# Patient Record
Sex: Female | Born: 1985 | Race: White
Health system: Northeastern US, Academic
[De-identification: ages and names within clinical notes are randomized; demographics above are authoritative.]

## PROBLEM LIST (undated history)

## (undated) DIAGNOSIS — D649 Anemia, unspecified: Secondary | ICD-10-CM

## (undated) DIAGNOSIS — F32A Depression, unspecified: Secondary | ICD-10-CM

## (undated) DIAGNOSIS — K922 Gastrointestinal hemorrhage, unspecified: Secondary | ICD-10-CM

## (undated) HISTORY — PX: CHOLECYSTECTOMY: SHX55

## (undated) HISTORY — PX: ABDOMINAL HYSTERECTOMY: SHX81

## (undated) HISTORY — PX: GALLBLADDER SURGERY: SHX652

---

## 2018-11-04 ENCOUNTER — Encounter: Payer: Self-pay | Admitting: Emergency Medicine

## 2018-11-04 ENCOUNTER — Emergency Department: Payer: MEDICAID | Admitting: Radiology

## 2018-11-04 ENCOUNTER — Observation Stay
Admission: EM | Admit: 2018-11-04 | Discharge: 2018-11-05 | Disposition: A | Discharge status: Home / Self Care | Payer: MEDICAID | Source: Ambulatory Visit | Attending: Emergency Medicine | Admitting: Emergency Medicine

## 2018-11-04 DIAGNOSIS — R000 Tachycardia, unspecified: Secondary | ICD-10-CM | POA: Insufficient documentation

## 2018-11-04 DIAGNOSIS — D649 Anemia, unspecified: Secondary | ICD-10-CM | POA: Insufficient documentation

## 2018-11-04 DIAGNOSIS — K920 Hematemesis: Principal | ICD-10-CM | POA: Insufficient documentation

## 2018-11-04 DIAGNOSIS — R1011 Right upper quadrant pain: Secondary | ICD-10-CM

## 2018-11-04 DIAGNOSIS — R1013 Epigastric pain: Secondary | ICD-10-CM | POA: Insufficient documentation

## 2018-11-04 DIAGNOSIS — R42 Dizziness and giddiness: Secondary | ICD-10-CM

## 2018-11-04 DIAGNOSIS — R109 Unspecified abdominal pain: Secondary | ICD-10-CM

## 2018-11-04 HISTORY — DX: Depression, unspecified: F32.A

## 2018-11-04 HISTORY — DX: Gastrointestinal hemorrhage, unspecified: K92.2

## 2018-11-04 HISTORY — DX: Anemia, unspecified: D64.9

## 2018-11-04 LAB — HM HIV SCREENING OFFERED

## 2018-11-04 LAB — PLASMA PROF 7 (ED ONLY)
Anion Gap,PL: 15 (ref 7–16)
CO2,Plasma: 25 mmol/L (ref 20–28)
Chloride,Plasma: 100 mmol/L (ref 96–108)
Creatinine: 0.75 mg/dL (ref 0.51–0.95)
GFR,Black: 121 *
GFR,Caucasian: 105 *
Glucose,Plasma: 89 mg/dL (ref 60–99)
Potassium,Plasma: 3.5 mmol/L (ref 3.4–4.7)
Sodium,Plasma: 140 mmol/L (ref 133–145)
UN,Plasma: 7 mg/dL (ref 6–20)

## 2018-11-04 LAB — RUQ PANEL (ED ONLY)
ALT: 15 U/L (ref 0–35)
AST: 15 U/L (ref 0–35)
Albumin: 4.8 g/dL (ref 3.5–5.2)
Alk Phos: 58 U/L (ref 35–105)
Amylase: 38 U/L (ref 28–100)
Bilirubin,Direct: <0.2 mg/dL (ref 0.0–0.3)
Bilirubin,Total: 0.3 mg/dL (ref 0.0–1.2)
Lipase: 16 U/L (ref 13–60)
Total Protein: 7.7 g/dL (ref 6.3–7.7)

## 2018-11-04 LAB — CBC AND DIFFERENTIAL
Baso # K/uL: 0 10*3/uL (ref 0.0–0.1)
Basophil %: 0.5 %
Eos # K/uL: 0.1 10*3/uL (ref 0.0–0.4)
Eosinophil %: 0.6 %
Hematocrit: 35 % (ref 34–45)
Hemoglobin: 10.3 g/dL — ABNORMAL LOW (ref 11.2–15.7)
IMM Granulocytes #: 0 10*3/uL
IMM Granulocytes: 0.3 %
Lymph # K/uL: 2.1 10*3/uL (ref 1.2–3.7)
Lymphocyte %: 27.2 %
MCH: 26 pg/cell (ref 26–32)
MCHC: 30 g/dL — ABNORMAL LOW (ref 32–36)
MCV: 87 fL (ref 79–95)
Mono # K/uL: 0.4 10*3/uL (ref 0.2–0.9)
Monocyte %: 4.9 %
Neut # K/uL: 5.2 10*3/uL (ref 1.6–6.1)
Nucl RBC # K/uL: 0 10*3/uL (ref 0.0–0.0)
Nucl RBC %: 0 /100 WBC (ref 0.0–0.2)
Platelets: 465 10*3/uL — ABNORMAL HIGH (ref 160–370)
RBC: 4 MIL/uL (ref 3.9–5.2)
RDW: 16.1 % — ABNORMAL HIGH (ref 11.7–14.4)
Seg Neut %: 66.5 %
WBC: 7.8 10*3/uL (ref 4.0–10.0)

## 2018-11-04 LAB — TYPE AND SCREEN
ABO RH Blood Type: A POS
Antibody Screen: NEGATIVE

## 2018-11-04 LAB — PROTIME-INR
INR: 1 (ref 0.9–1.1)
Protime: 11.9 s (ref 10.0–12.9)

## 2018-11-04 LAB — APTT: aPTT: 39 s — ABNORMAL HIGH (ref 25.8–37.9)

## 2018-11-04 LAB — BLOOD BANK HOLD LAVENDER

## 2018-11-04 LAB — BLOOD BANK HOLD RED

## 2018-11-04 LAB — PREGNANCY TEST, SERUM: Preg,Serum: NEGATIVE

## 2018-11-04 MED ORDER — DEXTROSE 5 % FLUSH FOR PUMPS *I*
0.0000 mL/h | INTRAVENOUS | Status: DC | PRN
Start: 2018-11-04 — End: 2018-11-05

## 2018-11-04 MED ORDER — SODIUM CHLORIDE 0.9 % FLUSH FOR PUMPS *I*
0.0000 mL/h | INTRAVENOUS | Status: DC | PRN
Start: 2018-11-04 — End: 2018-11-05

## 2018-11-04 MED ORDER — PANTOPRAZOLE SODIUM 40 MG IV SOLR *I*
40.0000 mg | Freq: Once | INTRAVENOUS | Status: AC
Start: 2018-11-04 — End: 2018-11-04
  Administered 2018-11-04: 40 mg via INTRAVENOUS
  Filled 2018-11-04: qty 10

## 2018-11-04 MED ORDER — MORPHINE SULFATE 4 MG/ML IV SOLN *WRAPPED*
4.0000 mg | Freq: Once | INTRAVENOUS | Status: AC
Start: 2018-11-04 — End: 2018-11-04
  Administered 2018-11-04: 4 mg via INTRAVENOUS
  Filled 2018-11-04: qty 1

## 2018-11-04 MED ORDER — PANTOPRAZOLE SODIUM 40 MG IV SOLR *I*
40.0000 mg | Freq: Two times a day (BID) | INTRAVENOUS | Status: DC
Start: 2018-11-05 — End: 2018-11-05
  Administered 2018-11-05: 40 mg via INTRAVENOUS
  Filled 2018-11-04: qty 10

## 2018-11-04 MED ORDER — FAMOTIDINE (PF) 20 MG/2ML IV SOLN *I*
20.0000 mg | Freq: Two times a day (BID) | INTRAVENOUS | Status: DC
Start: 2018-11-04 — End: 2018-11-05
  Administered 2018-11-04 – 2018-11-05 (×2): 20 mg via INTRAVENOUS
  Filled 2018-11-04 (×4): qty 2

## 2018-11-04 MED ORDER — SODIUM CHLORIDE 0.9 % IV SOLN WRAPPED *I*
100.0000 mL/h | Status: DC
Start: 2018-11-04 — End: 2018-11-05
  Administered 2018-11-04: 100 mL/h via INTRAVENOUS
  Administered 2018-11-04 – 2018-11-05 (×10): 100 mL/h

## 2018-11-04 MED ORDER — ACETAMINOPHEN 325 MG PO TABS *I*
650.0000 mg | ORAL_TABLET | Freq: Four times a day (QID) | ORAL | Status: DC | PRN
Start: 2018-11-04 — End: 2018-11-05
  Administered 2018-11-05: 650 mg via ORAL
  Filled 2018-11-04: qty 2

## 2018-11-04 MED ORDER — MORPHINE SULFATE 4 MG/ML IV SOLN *WRAPPED*
4.0000 mg | INTRAVENOUS | Status: AC | PRN
Start: 2018-11-04 — End: 2018-11-05
  Administered 2018-11-05 (×4): 4 mg via INTRAVENOUS
  Filled 2018-11-04 (×4): qty 1

## 2018-11-04 MED ORDER — PANTOPRAZOLE SODIUM 40 MG IV SOLR *I*
40.0000 mg | Freq: Two times a day (BID) | INTRAVENOUS | Status: DC
Start: 2018-11-04 — End: 2018-11-04

## 2018-11-04 MED ORDER — SODIUM CHLORIDE 0.9 % IV BOLUS *I*
1000.0000 mL | Freq: Once | Status: AC
Start: 2018-11-04 — End: 2018-11-04
  Administered 2018-11-04: 1000 mL via INTRAVENOUS

## 2018-11-04 MED ORDER — ONDANSETRON HCL 2 MG/ML IV SOLN *I*
4.0000 mg | Freq: Four times a day (QID) | INTRAMUSCULAR | Status: DC | PRN
Start: 2018-11-04 — End: 2018-11-05
  Administered 2018-11-04 – 2018-11-05 (×2): 4 mg via INTRAVENOUS
  Filled 2018-11-04 (×2): qty 2

## 2018-11-04 NOTE — ED Obs Notes (Signed)
ED OBSERVATION ADMISSION NOTE    Patient seen by me 11/04/2018 at 10:40 PM     Current patient status: Observation    History     Chief Complaint   Patient presents with    Hematemesis     32 year old female with a history of anemia who presented to the ED with hematemesis that started yesterday. She reports vomiting bright red blood, unable to quantify the amount. She also admits to diffuse stabbing abdominal pain and chills. She noticed some bruising on her arm today, no known trauma and also reports dizziness and myalgias. She denies bloody or black/tarry stools. She also denies fevers, cough/URI symptoms, urinary symptoms, diarrhea. She took tramadol without improvement in her pain. She denies similar symptoms in the past. She denies taking NSAIDs or aspirin and denies etoh intake.      History provided by:  Patient  Language interpreter used: No      Past Medical History:   Diagnosis Date    Anemia      Past Surgical History:   Procedure Laterality Date    CESAREAN SECTION, UNSPECIFIED      GALLBLADDER SURGERY       History reviewed. No pertinent family history.    Social History      reports that she has never smoked. She has never used smokeless tobacco. She reports previous alcohol use. She reports that she does not use drugs. No history on file for sexual activity.    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 Negative for fever.   HENT: Negative for congestion, rhinorrhea and sore throat.    Eyes: Positive for visual disturbance.   Respiratory: Negative for cough and shortness of breath.    Cardiovascular: Negative for chest pain.   Gastrointestinal: Positive for abdominal pain and vomiting. Negative for blood in stool, constipation and diarrhea.   Genitourinary:  Negative for difficulty urinating and dysuria.   Musculoskeletal: Positive for myalgias.   Skin: Positive for color change (bruises on upper arms).   Allergic/Immunologic: Negative for immunocompromised state.   Neurological: Positive for light-headedness and headaches.     Physical Exam   BP 131/76 (BP Location: Right arm)    Pulse 80    Temp 36.6 C (97.9 F) (Temporal)    Resp 16    Ht 1.524 m (5')    Wt 95.3 kg (210 lb)    SpO2 99%    BMI 41.01 kg/m     Physical Exam  Vitals signs and nursing note reviewed.   Constitutional:       Appearance: Normal appearance.   HENT:      Head: Normocephalic and atraumatic.      Nose: Nose normal.   Eyes:      Conjunctiva/sclera: Conjunctivae normal.   Neck:      Musculoskeletal: Normal range of motion.   Cardiovascular:      Rate and Rhythm: Normal rate.   Pulmonary:      Effort: Pulmonary effort is normal.      Breath sounds: Normal breath sounds.   Abdominal:      General: Bowel sounds are normal.  There is no distension.      Palpations: Abdomen is soft.      Tenderness: There is tenderness in the epigastric area and periumbilical area. There is no guarding.   Musculoskeletal:      Right lower leg: No edema.      Left lower leg: No edema.   Skin:     General: Skin is warm and dry.   Neurological:      General: No focal deficit present.      Mental Status: She is alert.   Psychiatric:         Mood and Affect: Mood normal.         Behavior: Behavior normal.       Tests   Labs:   WBC 7.8   Hemoglobin 10.3*   Hematocrit 35   Platelets 465*     Sodium,Plasma 140   Potassium,Plasma 3.5   Chloride,Plasma 100   CO2,Plasma 25   Anion Gap,PL 15   UN,Plasma 7   Creatinine 0.75   GFR,Black 121   GFR,Caucasian 105   Glucose,Plasma 89   Total Protein 7.7   Albumin 4.8   ALT 15   AST 15   Alk Phos 58   Amylase 38   Bilirubin,Direct <0.2   Bilirubin,Total 0.3   Lipase 16   Protime 11.9   INR 1.0   aPTT 39.0 (H)     Serum pregnancy negative     Imaging: none for this visit     Medical  Decision Making      Amount and/or Complexity of Data Reviewed  Clinical lab tests: ordered and reviewed    Assessment:    32 y.o. female with a history of anemia placed in OBS after evaluation in the ED for hematemesis and diffuse abdominal pain that started yesterday. Labs notable for HCT 35. Serum pregnancy negative, RUQ panel and BMP are unremarkable. She is hemodynamically stable, placed in OBS for ongoing monitoring and evaluation.     Differential Diagnosis includes PUD, esophagitis, gastritis, duodenitis                   Plan:   1. Hematemesis   -IV Protonix and IV pepcid BID    -IV Zofran as needed for nausea   -PO Tylenol/IV Morphine as needed for pain   -Monitor HCT q8h   -NPO, maintenance IVF at 100 cc/hour  -GI consult in the morning     Medically preferred DVT prophylaxis: None  Smoking Cessation: [address], [place]     [place], [place], [place]  11/04/18 2349

## 2018-11-04 NOTE — ED Notes (Signed)
Pt arrived on EOU from ed via stretcher, transferred independently, husband at bedside. Pt currently rating abdominal pain as a 6 out of 10. Had emesis basin at bedside, with dried bright red blood from her her last emesis in ED approximately 30 min ago per pt. Pt also reports body aches that started yesterday with vomiting. Per pt, had not been having emesis until yesterday and that the bright red blood was present in her first episode. Reviewed cal light system, location of BR, voiced understanding, call light left within reach

## 2018-11-04 NOTE — ED Notes (Signed)
11/04/18 2238   Observation Care   Observation care initiated  Yes   Patient has been verbally notified of their observation status Yes

## 2018-11-04 NOTE — ED Triage Notes (Addendum)
Denies pregnancy / Began vomiting blood this am / " stabbing abdominal pain" Has picture of BRB on phone       Triage Note   Danielle Rankin, RN

## 2018-11-04 NOTE — ED Notes (Signed)
Pt arrived to ED with family member. Pt c/o of abdominal pain since yesterday. Pt has been having numerous episodes of vomiting, pt experiencing bright red blood when she vomits. Pt states she has never had this happen before. Pt rating pain at 10 that feels like stabbing, pain in non radiating. Pt is not pregnant, confirmed by blood test.

## 2018-11-04 NOTE — Progress Notes (Signed)
ED RN INTERN ATTESTATION       I Marcy Siren, RN (RN) reviewed the following charting information by the RN intern:   Annette Preston    Nursing Assessments  Medications  Plan of Care  Teaching   Notes    In the chart of Annette Preston (32 y.o. female) and attest to the charting being accurate.

## 2018-11-04 NOTE — ED Provider Notes (Addendum)
History     Chief Complaint   Patient presents with    Hematemesis     32 year old female with history of anemia presents to the ED for evaluation of hematemesis and upper abdominal pain. Patient states that symptoms began suddenly yesterday with onset epigastric and right upper quadrant abdominal pain. This has been followed by nausea and vomiting of bright red blood. Patient describes abdominal pain as moderate in severity, and stabbing in quality. Denies radiation up into the chest or back as well as shortness of breath however reports generalized myalgias. Notes dizziness and recent easy bruising but denies headaches, palpitations, cough, rhinorrhea, recent illness, fevers, chills, rash, urinary symptoms, diarrhea, or any other complaints at this time. Notes history of anemia requiring transfusions in the past believed to be due to her heavy periods. Denies ETOH use or OCP use at this time.       History provided by:  Patient      Medical/Surgical/Family History     No past medical history on file.     There is no problem list on file for this patient.           No past surgical history on file.  No family history on file.       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   HENT: Negative for congestion and rhinorrhea.    Respiratory: Negative for cough and shortness of breath.    Cardiovascular: Negative for chest pain and palpitations.   Gastrointestinal: Positive for abdominal pain, nausea and vomiting. Negative for diarrhea.        Positive for Hematemesis   Endocrine: Negative for polyuria.   Genitourinary: Negative  for dysuria, hematuria and pelvic pain.   Musculoskeletal: Positive for myalgias. Negative for arthralgias, back pain and neck pain.   Neurological: Positive for dizziness. Negative for syncope, light-headedness and headaches.   Hematological: Bruises/bleeds easily.       Physical Exam     Triage Vitals      First Recorded BP: (!) 150/100, Resp: 18, Temp: 36.2 C (97.2 F), Temp src: TEMPORAL Oxygen Therapy SpO2: 100 %, Oximetry Source: Lt Hand, O2 Device: None (Room air), Heart Rate: 101, (11/04/18 1501)  .  First Pain Reported  0-10 Scale: 10, (11/04/18 1501)       Physical Exam  Vitals signs and nursing note reviewed.   Constitutional:       General: She is not in acute distress.     Appearance: She is well-developed and normal weight. She is not ill-appearing, toxic-appearing or diaphoretic.      Comments: Comfortable appearing young female.  BRB in vomit bucket   HENT:      Head: Normocephalic and atraumatic.      Nose: Nose normal. No congestion or rhinorrhea.      Mouth/Throat:  Mouth: Mucous membranes are dry.   Eyes:      General: No scleral icterus.     Extraocular Movements: Extraocular movements intact.      Conjunctiva/sclera: Conjunctivae normal.      Pupils: Pupils are equal, round, and reactive to light.   Neck:      Musculoskeletal: Normal range of motion and neck supple.   Cardiovascular:      Rate and Rhythm: Regular rhythm. Tachycardia present.      Heart sounds: Normal heart sounds. No murmur. No friction rub. No gallop.    Pulmonary:      Effort: Pulmonary effort is normal. No respiratory distress.      Breath sounds: Normal breath sounds. No wheezing or rales.   Chest:      Chest wall: No tenderness.   Abdominal:      General: Bowel sounds are normal. There is no distension.      Palpations: Abdomen is soft. There is no mass.      Tenderness: There is tenderness (RUQ Tenderness) in the right upper quadrant and epigastric area. There is no guarding or rebound.   Musculoskeletal: Normal  range of motion.         General: No tenderness or deformity.   Skin:     General: Skin is warm and dry.      Coloration: Skin is not jaundiced.      Findings: No erythema or rash.   Neurological:      General: No focal deficit present.      Mental Status: She is alert and oriented to person, place, and time. Mental status is at baseline.      Cranial Nerves: No cranial nerve deficit.      Motor: No weakness.      Gait: Gait normal.   Psychiatric:         Mood and Affect: Mood normal.         Behavior: Behavior normal.         Thought Content: Thought content normal.         Medical Decision Making   Patient seen by me on:  11/04/2018    Assessment:  32 year old female presents to the ED for evaluation of hematemesis and epigastric/RUQ abdominal pain. Patient is hemodynamically stable at this time with examination notable for mild tachycardia as well as epigastric and RUQ tenderness to palpation. No other acute findings noted. Given patient history and reports of easy bruising, concern for platelet dysfunction or liver dysfunction. Abdominal ulcer, gastritis, mallory weiss tear also considered as well as RUQ pathology including cholecystitis, cholelithiasis or pancreatitis. Will treat with Protonix initially, Obtain IV access, and give fluid bolus. Will also get CBC, Electrolytes, RUQ Panel, PT/INR for assessment.      Differential diagnosis:  Gastritis, Abdominal Ulcer, Mallory Weiss Tear, Platelet Dysfunction, Liver Dysfunction, Cholecystitis, Cholelithiasis, Pancreatitis.     Plan:  Protonix initially, Obtain IV access, fluid bolus. Will also get CBC, Electrolytes, RUQ Panel, PT/INR.    ED Course and Disposition:  Patient did not have recurrence of vomiting or hematemesis however did report continued nausea and generalized body aches. Labwork was unremarkable for acute findings including RUQ pathology or liver dysfunction. Given overall stability however continued symptoms, discussed patient with observation  service and patient accepted for further assessment of likely mallory weiss tear.             Lezlie Octave, MD    Resident Attestation:    Patient  seen by me on 11/04/2018.    History:  I reviewed this patient, reviewed the resident's note and agree.    Exam:  I examined this patient, reviewed the resident's note and agree.    Decision Making:  I discussed with the resident his/her documented decision making and agree.      Author:  Nyoka Cowden, MD       Henrene Hawking, MD  Resident  11/05/18 0123       Amond Speranza, Jen Mow, MD  11/06/18 [phone]

## 2018-11-04 NOTE — ED Notes (Signed)
Report Given To  Malachy Mood RN      Descriptive Sentence / Reason for Admission   Pt arrived to ED with family member. Pt c/o of abdominal pain since yesterday. Pt has been having numerous episodes of vomiting, pt experiencing bright red blood when she vomits. Pt states she has never had this happen before. Pt rating pain at 10 that feels like stabbing, pain in non radiating. Pt is not pregnant, confirmed by blood test.      Active Issues / Relevant Events   Vomiting bright red blood x1 day  ABD pain        To Do List  VS/Assess  Meds per [facility]      Anticipatory Guidance / Discharge Planning  Obs

## 2018-11-04 NOTE — First Provider Contact (Signed)
ED First Provider Contact Note     Initial provider evaluation performed by   ED First Provider Contact     Date/Time Event User Comments    11/04/18 1503 ED First Provider Contact Annette Preston Initial Face to Face Provider Contact          Vital signs reviewed.    32 y.o. female presents to ED with complaint of hematemesis and abdominal pain since yesterday. Does not take any blood thinners, aspirin or NSAIDs.     Orders placed:  LABS     Patient requires further evaluation.     Rondall Allegra, PA, 11/04/2018, 3:03 PM     Rondall Allegra, PA  11/04/18 1504

## 2018-11-05 ENCOUNTER — Observation Stay: Payer: MEDICAID | Admitting: Gastroenterology

## 2018-11-05 ENCOUNTER — Observation Stay: Payer: MEDICAID

## 2018-11-05 LAB — CBC AND DIFFERENTIAL
Baso # K/uL: 0 10*3/uL (ref 0.0–0.1)
Basophil %: 0.5 %
Eos # K/uL: 0.1 10*3/uL (ref 0.0–0.4)
Eosinophil %: 1.1 %
Hematocrit: 30 % — ABNORMAL LOW (ref 34–45)
Hemoglobin: 9.2 g/dL — ABNORMAL LOW (ref 11.2–15.7)
IMM Granulocytes #: 0 10*3/uL
IMM Granulocytes: 0.3 %
Lymph # K/uL: 2 10*3/uL (ref 1.2–3.7)
Lymphocyte %: 32.1 %
MCH: 27 pg/cell (ref 26–32)
MCHC: 31 g/dL — ABNORMAL LOW (ref 32–36)
MCV: 87 fL (ref 79–95)
Mono # K/uL: 0.4 10*3/uL (ref 0.2–0.9)
Monocyte %: 6.5 %
Neut # K/uL: 3.7 10*3/uL (ref 1.6–6.1)
Nucl RBC # K/uL: 0 10*3/uL (ref 0.0–0.0)
Nucl RBC %: 0 /100 WBC (ref 0.0–0.2)
Platelets: 409 10*3/uL — ABNORMAL HIGH (ref 160–370)
RBC: 3.5 MIL/uL — ABNORMAL LOW (ref 3.9–5.2)
RDW: 16.2 % — ABNORMAL HIGH (ref 11.7–14.4)
Seg Neut %: 59.5 %
WBC: 6.1 10*3/uL (ref 4.0–10.0)

## 2018-11-05 LAB — HEMATOCRIT: Hematocrit: 30 % — ABNORMAL LOW (ref 34–45)

## 2018-11-05 LAB — PLASMA PROF 7 (ED ONLY)
Anion Gap,PL: 12 (ref 7–16)
CO2,Plasma: 26 mmol/L (ref 20–28)
Chloride,Plasma: 103 mmol/L (ref 96–108)
Creatinine: 0.81 mg/dL (ref 0.51–0.95)
GFR,Black: 111 *
GFR,Caucasian: 96 *
Glucose,Plasma: 102 mg/dL — ABNORMAL HIGH (ref 60–99)
Potassium,Plasma: 3.6 mmol/L (ref 3.4–4.7)
Sodium,Plasma: 141 mmol/L (ref 133–145)
UN,Plasma: 5 mg/dL — ABNORMAL LOW (ref 6–20)

## 2018-11-05 LAB — MCHC: MCHC: 31 g/dL — ABNORMAL LOW (ref 32–36)

## 2018-11-05 MED ORDER — LACTATED RINGERS IV SOLN *I*
100.0000 mL/h | INTRAVENOUS | Status: DC
Start: 2018-11-05 — End: 2018-11-05
  Administered 2018-11-05 (×2): 100 mL/h via INTRAVENOUS

## 2018-11-05 MED ORDER — FENTANYL CITRATE 50 MCG/ML IJ SOLN *WRAPPED*
INTRAMUSCULAR | Status: AC | PRN
Start: 2018-11-05 — End: 2018-11-05
  Administered 2018-11-05 (×2): 25 ug via INTRAVENOUS

## 2018-11-05 MED ORDER — MIDAZOLAM HCL 1 MG/ML IJ SOLN *I* WRAPPED
INTRAMUSCULAR | Status: AC | PRN
Start: 2018-11-05 — End: 2018-11-05
  Administered 2018-11-05 (×2): 2 mg via INTRAVENOUS

## 2018-11-05 MED ORDER — FENTANYL CITRATE 50 MCG/ML IJ SOLN *WRAPPED*
INTRAMUSCULAR | Status: AC
Start: 2018-11-05 — End: 2018-11-05
  Filled 2018-11-05: qty 4

## 2018-11-05 MED ORDER — MIDAZOLAM HCL 1 MG/ML IJ SOLN *I* WRAPPED
INTRAMUSCULAR | Status: AC
Start: 2018-11-05 — End: 2018-11-05
  Filled 2018-11-05: qty 10

## 2018-11-05 MED ORDER — DEXTROSE 5 % FLUSH FOR PUMPS *I*
0.0000 mL/h | INTRAVENOUS | Status: DC | PRN
Start: 2018-11-05 — End: 2018-11-05

## 2018-11-05 MED ORDER — SODIUM CHLORIDE 0.9 % FLUSH FOR PUMPS *I*
0.0000 mL/h | INTRAVENOUS | Status: DC | PRN
Start: 2018-11-05 — End: 2018-11-05

## 2018-11-05 NOTE — ED Notes (Signed)
Patient back on the unit from EGD. Patient ambulated to bathroom with a steady gait. Will continue to treat and monitor.

## 2018-11-05 NOTE — ED Notes (Addendum)
Plan of Care     Reviewed with pt and includes:   IVF's   VS per policy   Serial HCT's every 8 hours   Antiemetics   Pain management   NPO   GI consult   Maintain safety and comfort   OBS provider following

## 2018-11-05 NOTE — ED Notes (Signed)
Patient off the unit to go to xray. Will continue to treat and monitor.

## 2018-11-05 NOTE — Consults (Addendum)
Division of Gastroenterology and Hepatology Initial Consult    Admit Date:  11/04/2018  Attending Provider:  No att. providers found                                  Primary Care Physician:  Provider, None, MD     Consult reason: hematemesis     Subjective:  Chart reviewed. History obtained from patient and chart review    Annette Preston is a 32 y.o. female with a past medical history notable for cholecystectomy who presents with hematemesis x2 days.     Patient states she was in usual state of health until two days ago when she developed nausea and had multiple episode of frank hematemesis. States this occurred about 5 times on Sunday and was always frank red blood without food contents. Denies retching or other preceding symptoms. Denies coughing episodes and denies spitting up blood with phlegm. Does endorse diffuse sharp abdominal pain at that time as well. Given her symptoms she sough evaluation in the emergency room.     On arrival she was noted to have Hct 35 on 11/25 and repeat today showed Hct of 30 with a few small volume episodes since arrival. Denies every having EGD in the past. Denies history of GERD, EtoH use or tobacco use.     Review of Systems  Constitutional: No unexplained weight loss, fevers, fatigue, generalized weakness, or loss of appetite  Eyes: No vision changes, eye pain, or conjunctival injection  Ears, nose, and throat: No epistaxis, gingival bleeding, or sore throat   Cardiovascular: No chest pain, palpitations, dyspnea on exertion, lower extremity edema, or syncope  Pulmonary: No sputum production, shortness of breath at rest, wheezing, or hemoptysis   Gastrointestinal: See HPI  Genitourinary: No dysuria or hematuria  Endocrine: No heat or cold intolerance or diaphoresis  Hematologic: No easy bruising or bleeding, anemia, thrombocytopenia, or lymphadenopathy  Musculoskeletal: No joint pain, stiffness, erythema, warmth, or swelling or myalgias  Integumentary: No rashes, lesions, or  jaundice  Neurologic: No headaches, loss of extremity strength or sensation, or paresthesias  Psychiatric: No anxiety, depression, or insomnia     Medications:  Home Medications:  Prior to Admission medications    Medication Sig Start Date End Date Taking? Authorizing Provider   IRON CR PO Take by mouth 2 times daily   Yes [provider]     Scheduled Meds   famotidine  20 mg Intravenous 2 times per day    pantoprazole  40 mg Intravenous Q12H     Continuous Infusions   sodium chloride 100 mL/hr (11/05/18 1478)     PRN Meds  sodium chloride, dextrose, sodium chloride, dextrose, ondansetron, acetaminophen, morphine *PF*    Allergies/Sensitivities:   Allergies as of 11/04/2018    (No Known Allergies (drug, envir, food or latex))       Past Medical Hx:   Past Medical History:   Diagnosis Date    Anemia        Past Surgical Hx:   Past Surgical History:   Procedure Laterality Date    CESAREAN SECTION, UNSPECIFIED      GALLBLADDER SURGERY         Social Hx:   reports that she has never smoked. She has never used smokeless tobacco.   reports previous alcohol use.   reports no history of drug use.    Family Hx: family history is not on  file.    PHYSICAL EXAM:  Patient Vitals for the past 72 hrs:   BP Temp Temp src Pulse Resp SpO2 Height Weight   11/05/18 0839 106/68 36.5 C (97.7 F) TEMPORAL 85 16 95 % -- --   11/05/18 0456 105/68 36.6 C (97.9 F) TEMPORAL 89 16 97 % -- --   11/05/18 0009 118/79 36.3 C (97.3 F) TEMPORAL 83 16 98 % -- --   11/04/18 2202 131/76 36.6 C (97.9 F) TEMPORAL 80 16 99 % -- --   11/04/18 1936 121/79 36.4 C (97.5 F) TEMPORAL 78 16 100 % -- --   11/04/18 1643 136/79 35.4 C (95.7 F) TEMPORAL 107 18 99 % -- --   11/04/18 1501 (!) 150/100 36.2 C (97.2 F) -- 101 18 100 % 152.4 cm (5') 95.3 kg (210 lb)     Wt Readings from Last 3 Encounters:   11/04/18 95.3 kg (210 lb)     I/O last 3 completed shifts:  11/25 0700 - 11/26 0659  In: 706 (7.4 mL/kg) [I.V.:706 (0.3 mL/kg/hr)]  Out: -  (0 mL/kg)   Net: 706  Weight: 95.3 kg     General appearance: alert, appears stated age and cooperative  Eye: EOMI, anicteric   Ears, Nose, Mouth and throat: MMM  Neck: supple  Cardiovascular: regular rate and rhythm, S1, S2 normal, no murmur, rubs, or gallop.     Respiratory: clear to auscultation bilaterally with no wheezes, rales or rhonchi appreciated   Gastrointestinal: abdomen soft, non-tender; +bowel sounds in all 4 quadrants;   Skin: no jaundice  Neurological: grossly normal, no asterixis  Psychiatric: normal affect   Extremities: extremities warm and dry. No edema    LAB DATA:      Lab results: 11/05/18  0156 11/04/18  1818   WBC  --  7.8   Hemoglobin  --  10.3*   Hematocrit 30* 35   RBC  --  4.0   Platelets  --  465*           Lab results: 11/04/18  1818   Creatinine 0.75   Total Protein 7.7   Albumin 4.8   ALT 15   AST 15   Alk Phos 58   Bilirubin,Total 0.3           Lab results: 11/04/18  1818   INR 1.0       Assessment/Recommendations  32 y.o. female with history of cholecystectomy who presents following multiple episodes of hematemesis. On arrival noted to have Hct of 35 with repeat of 30 - although that may be dilutional and thus repeat CBC pending. Broad differential for etiology of there bleeding which includes PUD, erosive esophagitis, mallory weiss tears. Will likely need upper endoscopy to further elucidate source of her bleeding.     Recommendations  -keep NPO, plan for EGD today   -protonix 40 mg IV BID   -trend CBC and transfuse as needed   -please notify our service if patient becomes hemodynamically unstable in context of ongoing bleeding       Case to be discussed with consult attending.      Lenise Arena, MD  PGY-4, Gastroenterology & Hepatology Fellow    GI ATTENDING:   I reviewed this case with Dr. Annamaria Boots. Dr. Ronalee Red completed endoscopy today to facilitate prompt care of this patient with normal findings on that exam. Patient was discharged prior to rounds this afternoon.     Scherry Ran., MD

## 2018-11-05 NOTE — ED Notes (Signed)
Pt put on call light, rating abdominal pain as a 10 out of 10, requesting pain medication. Has approximately 20-30cc bright red blood with scant amount phlegm in bedside basin and multiple tissues with small amounts of blood on them. Writer asked pt if she was coughing up blood or having episodes of emesis, per pt it's emesis. Explained to pt that unable to give analgesic at this time, it is due to be given at 0330, pt voiced understanding, pt appears in NAD.

## 2018-11-05 NOTE — Procedures (Addendum)
EGD Procedure Note   Date of Procedure: 11/05/2018    Referring Physician: No ref. provider found   Primary Care Provider: Provider, None, MD   Attending Physician: Reynaldo Minium, DO  Fellow: Lockie Mola, MD  Indication(s): Hematemesis    Medications: Cetacaine spray, Fentanyl 50 mcg IV and Midazolam 4 mg IV were administered incrementally over the course of the procedure to achieve an adequate level of conscious sedation.  Moderate Sedation Face Times  Start Time: 1230  End Time: 1240  Duration (minutes): 10 Minutes     Endoscope: [id number]  Accessories:None    Procedure Description: Full disclosure of risks were reviewed with patient as detailed on the consent form. The patient was placed in the left lateral decubitus position and monitored with continuous pulse oximetry, interval blood pressure monitoring and direct observations.   After adequate sedation, the endoscope was carefully introduced into the oropharynx and passed in to the esophagus under direct visualization. The esophagus and GE junction were carefully examined, including a measurement of the Z-line at 39 cm, GEJ at 39 cm and hiatus at 39 cm from the incisors. After advancement of the endoscope into the stomach, a careful examination was performed, including views of the antrum, incisura angularis, corpus and retroflexed views of the cardia and fundus.   The pylorus was then intubated without any difficulty and the endoscope was advanced to the second portion of the duodenum. Careful examination of the second portion of the duodenum and the bulb was then performed.  The stomach was then decompressed and the endoscope withdrawn.   Findings and intervention(s) are detailed below. The patient was recovered in the GI recovery area    Findings:     Esophagus:   Normal      Stomach:   Normal      Duodenum/small bowel:   Normal    Intervention(s):   None    Complication(s):   none    EBL: 0 ml    Impression(s):  1. Normal  examination    Recommendation(s):   Follow up with inpatient team    Histopathologic Diagnosis:   none    During the above endoscopic procedure, I was present for the entire viewing portion of the procedure including insertion to removal of the scope.    I was present throughout the entire moderate sedation.    Reynaldo Minium, DO  Associate Professor of Medicine  Attending Physician, Division of Gastroenterology and Hepatology, [facility]

## 2018-11-05 NOTE — ED Obs Notes (Signed)
ED OBSERVATION DISCHARGE NOTE    Patient seen by me today, 11/05/2018 at 0830am     Current patient status: Observation    Subjective:  Back from EGD and is asking to be discharged home. Notes that vomiting has significantly decreased- thinks she had only a small amount of bloody emesis after her procedure when she went to the bathroom but was not able to show this to the nurse or Probation officer. Still has some abdominal pain as well but thinks tylenol will be adequate. She also notes that she has tramadol still at home from previous episodes of abdominal pain.     Observation Stay Includes:  32 y.o.female who presented to the ED with   Chief Complaint   Patient presents with    Hematemesis     Last Nursing documented pain:  0-10 Scale: 8 (11/05/18 1439)  NVPS2 (Non Crit Care) - Score: 0 (11/05/18 1045)    Vitals:    Patient Vitals for the past 24 hrs:   BP Temp Temp src Pulse Resp SpO2 Height Weight   11/05/18 1439 142/90 36.6 C (97.9 F) TEMPORAL 99 16 98 % -- --   11/05/18 1315 114/83 -- -- -- -- 95 % -- --   11/05/18 1300 118/86 -- -- -- 16 94 % -- --   11/05/18 1248 (!) 124/93 -- -- -- 16 96 % -- --   11/05/18 1241 131/86 -- -- -- 18 100 % -- --   11/05/18 1235 136/89 -- -- -- 18 100 % -- --   11/05/18 1230 120/72 -- -- -- 16 100 % -- --   11/05/18 1127 107/77 36.7 C (98.1 F) TEMPORAL 90 16 97 % -- --   11/05/18 0839 106/68 36.5 C (97.7 F) TEMPORAL 85 16 95 % -- --   11/05/18 0456 105/68 36.6 C (97.9 F) TEMPORAL 89 16 97 % -- --   11/05/18 0009 118/79 36.3 C (97.3 F) TEMPORAL 83 16 98 % -- --   11/04/18 2202 131/76 36.6 C (97.9 F) TEMPORAL 80 16 99 % -- --   11/04/18 1936 121/79 36.4 C (97.5 F) TEMPORAL 78 16 100 % -- --   11/04/18 1643 136/79 35.4 C (95.7 F) TEMPORAL 107 18 99 % -- --   11/04/18 1501 (!) 150/100 36.2 C (97.2 F) -- 101 18 100 % 1.524 m (5') 95.3 kg (210 lb)     Physical Exam:  Physical Exam  Vitals signs and nursing note reviewed.   Constitutional:       General: She is not in acute  distress.     Appearance: Normal appearance.   HENT:      Head: Normocephalic and atraumatic.   Cardiovascular:      Rate and Rhythm: Normal rate and regular rhythm.      Heart sounds: Normal heart sounds.   Pulmonary:      Effort: Pulmonary effort is normal. No respiratory distress.      Breath sounds: Normal breath sounds.   Abdominal:      General: Abdomen is flat. Bowel sounds are normal. There is no distension.      Palpations: Abdomen is soft.      Tenderness: There is tenderness.      Comments: + epigastric tenderness   Skin:     General: Skin is warm and dry.   Neurological:      Mental Status: She is alert.   Psychiatric:         Mood  and Affect: Mood normal.         Behavior: Behavior normal.       EKG: NA    Labs:   All labs in the last 24 hours:   Recent Results (from the past 24 hour(s))   CBC and differential    Collection Time: 11/04/18  6:18 PM   Result Value Ref Range    WBC 7.8 4.0 - 10.0 THOU/uL    RBC 4.0 3.9 - 5.2 MIL/uL    Hemoglobin 10.3 (L) 11.2 - 15.7 g/dL    Hematocrit 35 34 - 45 %    MCV 87 79 - 95 fL    MCH 26 26 - 32 pg/cell    MCHC 30 (L) 32 - 36 g/dL    RDW 16.1 (H) 11.7 - 14.4 %    Platelets 465 (H) 160 - 370 THOU/uL    Seg Neut % 66.5 %    Lymphocyte % 27.2 %    Monocyte % 4.9 %    Eosinophil % 0.6 %    Basophil % 0.5 %    Neut # K/uL 5.2 1.6 - 6.1 THOU/uL    Lymph # K/uL 2.1 1.2 - 3.7 THOU/uL    Mono # K/uL 0.4 0.2 - 0.9 THOU/uL    Eos # K/uL 0.1 0.0 - 0.4 THOU/uL    Baso # K/uL 0.0 0.0 - 0.1 THOU/uL    Nucl RBC % 0.0 0.0 - 0.2 /100 WBC    Nucl RBC # K/uL 0.0 0.0 - 0.0 THOU/uL    IMM Granulocytes # 0.0 THOU/uL    IMM Granulocytes 0.3 %   Plasma profile 7 (Adult ED only)    Collection Time: 11/04/18  6:18 PM   Result Value Ref Range    Chloride,Plasma 100 96 - 108 mmol/L    CO2,Plasma 25 20 - 28 mmol/L    Potassium,Plasma 3.5 3.4 - 4.7 mmol/L    Sodium,Plasma 140 133 - 145 mmol/L    Anion Gap,PL 15 7 - 16    UN,Plasma 7 6 - 20 mg/dL    Creatinine 0.75 0.51 - 0.95 mg/dL     GFR,Caucasian 105 *    GFR,Black 121 *    Glucose,Plasma 89 60 - 99 mg/dL   RUQ panel (ED only)    Collection Time: 11/04/18  6:18 PM   Result Value Ref Range    Amylase 38 28 - 100 U/L    Lipase 16 13 - 60 U/L    Total Protein 7.7 6.3 - 7.7 g/dL    Albumin 4.8 3.5 - 5.2 g/dL    Bilirubin,Total 0.3 0.0 - 1.2 mg/dL    Bilirubin,Direct <0.2 0.0 - 0.3 mg/dL    Alk Phos 58 35 - 105 U/L    AST 15 0 - 35 U/L    ALT 15 0 - 35 U/L   Protime-INR    Collection Time: 11/04/18  6:18 PM   Result Value Ref Range    Protime 11.9 10.0 - 12.9 sec    INR 1.0 0.9 - 1.1   APTT    Collection Time: 11/04/18  6:18 PM   Result Value Ref Range    aPTT 39.0 (H) 25.8 - 37.9 sec   Blood bank hold lavender    Collection Time: 11/04/18  6:18 PM   Result Value Ref Range    Bld Bank Hld Lav Lav In Bld Bank    Blood bank hold red    Collection Time: 11/04/18  6:18 PM   Result Value Ref Range    Bld Bank Hld Red Red In Bld Bank    Type and screen    Collection Time: 11/04/18  6:18 PM   Result Value Ref Range    ABO RH Blood Type A RH POS     Antibody Screen Negative    HCG, serum qualitative, pregnancy    Collection Time: 11/04/18  6:18 PM   Result Value Ref Range    Preg,Serum NEG NEGATIVE   Hematocrit    Collection Time: 11/05/18  1:56 AM   Result Value Ref Range    Hematocrit 30 (L) 34 - 45 %   MCHC    Collection Time: 11/05/18  1:56 AM   Result Value Ref Range    MCHC 31 (L) 32 - 36 g/dL   Plasma profile 7    Collection Time: 11/05/18  9:54 AM   Result Value Ref Range    Chloride,Plasma 103 96 - 108 mmol/L    CO2,Plasma 26 20 - 28 mmol/L    Potassium,Plasma 3.6 3.4 - 4.7 mmol/L    Sodium,Plasma 141 133 - 145 mmol/L    Anion Gap,PL 12 7 - 16    UN,Plasma 5 (L) 6 - 20 mg/dL    Creatinine 0.81 0.51 - 0.95 mg/dL    GFR,Caucasian 96 *    GFR,Black 111 *    Glucose,Plasma 102 (H) 60 - 99 mg/dL   CBC and differential    Collection Time: 11/05/18  9:54 AM   Result Value Ref Range    Hematocrit 30 (L) 34 - 45 %    WBC 6.1 4.0 - 10.0 THOU/uL    RBC 3.5  (L) 3.9 - 5.2 MIL/uL    Hemoglobin 9.2 (L) 11.2 - 15.7 g/dL    MCV 87 79 - 95 fL    MCH 27 26 - 32 pg/cell    MCHC 31 (L) 32 - 36 g/dL    RDW 16.2 (H) 11.7 - 14.4 %    Platelets 409 (H) 160 - 370 THOU/uL    Seg Neut % 59.5 %    Lymphocyte % 32.1 %    Monocyte % 6.5 %    Eosinophil % 1.1 %    Basophil % 0.5 %    Neut # K/uL 3.7 1.6 - 6.1 THOU/uL    Lymph # K/uL 2.0 1.2 - 3.7 THOU/uL    Mono # K/uL 0.4 0.2 - 0.9 THOU/uL    Eos # K/uL 0.1 0.0 - 0.4 THOU/uL    Baso # K/uL 0.0 0.0 - 0.1 THOU/uL    Nucl RBC % 0.0 0.0 - 0.2 /100 WBC    Nucl RBC # K/uL 0.0 0.0 - 0.0 THOU/uL    IMM Granulocytes # 0.0 THOU/uL    IMM Granulocytes 0.3 %     Imaging findings:   11/26 Abdomen FAS with PA chest:   Nonobstructive bowel gas pattern with small amount of stool present in the ascending colon and rectum.      No evidence of pneumomediastinum or pleural effusion.     Cardiac Testing: none  Consults: GI    Assessment: 32 yr old PMH anemia placed in Obs for further evaluation of abdominal pain and hematemesis.     Plan:  Hematemesis, Abdominal Pain   - GI consult: s/p EGD which was normal per report. No indication for PPI.   - HCT stable 30 from 30   - Clear liquid diet -advance to  bland as tolerated  - Encourage bowel regimen     Disposition: Discussed that writer would prefer patient to stay for further evaluation and treatment of her abdominal pain (observation, tylenol prn, bowel regimen, and if no improvement would then consider CT). However patient insists that she wants to go home now to be with her children.     Follow-up:  within the next 2-5 days. with PCP. Patient has no PCP as just moved to area- next available appointment in [facility] internal medicine clinic on Friday 12/6 at 230pm.   Smoking Cessation: NA    Diagnoses that have been ruled out:   None   Diagnoses that are still under consideration:   None   Final diagnoses:   Hematemesis, presence of nausea not specified     Author: Melina Copa, NP  Note created: 11/05/2018  at:  3:00 PM     Francys Bolin, Lajean Manes, NP  11/05/18 1516

## 2018-11-05 NOTE — ED Notes (Signed)
Patient off the unit to GI for an EGD. Patient in gown and personal items removed and left with husband. IV flushed and capped. Will continue to treat and monitor.

## 2018-11-05 NOTE — ED Notes (Signed)
Patient assessed. Patient A&Ox4 and currently rates her pain as a 10/10 and described as stabbing in her abdomen and constant discomfort all over her body. Patient states that she has not vomited any bright red blood this morning. Patient denies light headedness, dizziness, SOB, numbness, or tingling. Patient's bowel sounds are active and she denies tenderness and did not demonstrate abdominal guarding. Patient states that she is hungry and hoping she can eat today. Bed in lowest position, call bell within reach, will continue to treat and monitor.

## 2018-11-05 NOTE — Preop H&P (Addendum)
UPDATES TO PATIENT'S CONDITION on the DAY OF SURGERY/PROCEDURE    I. Updates to Patient's Condition: Stable vitals, Hct 30.    Day of Surgery/Procedure Update:  History  History reviewed and no change    Physical  Physical exam updated and no change            II. Procedure Readiness   I have reviewed the patient's H&P and updated condition. By completing and signing this form, I attest that this patient is ready for surgery/procedure.    III. Attestation   I have reviewed the updated information regarding the patient's condition and it is appropriate to proceed with the planned surgery/procedure.    Alphia Kava Doyne Keel, MD as of 11:57 AM 11/05/2018     I saw and evaluated the patient. I agree with the resident's/fellow's findings and plan of care as documented above.    Reynaldo Minium, DO

## 2018-11-05 NOTE — ED Notes (Signed)
Plan of Care     VS q4, assessments, NPO, antiemetics prn, pain management, NS @ 100, CBC q8, medication administration, comfort measures

## 2018-11-05 NOTE — ED Notes (Signed)
Discharge instructions reviewed with patient, patient verbalized understanding. PIV removed, proper clothing in place. Patient to be discharged and driven home by husband who was present at the bedisde.

## 2018-11-05 NOTE — Progress Notes (Signed)
11/05/18 1007   [facility] Patient Class Review   Patient Class Review Observation   Patient class effective 11/04/18    Lucas Mallow, RN  Utilization Review  X [phone]  Pager [phone]

## 2018-11-05 NOTE — Discharge Instructions (Signed)
You were placed in the Emergency Observation Department for further evaluation of abdominal pain and vomiting blood. Your labs were monitored and your blood count remained stable. You had an xray of your abdomen that showed constipation. You were evaluated by the Gastroenterology specialists and underwent and EGD (scope into your esophagus and stomach) that was completely normal. You continued to have some pain (but stated vomiting decreased) but you insisted on being discharged home despite the Obs provider encouraging you to stay for ongoing evaluation.     You may try to drink clear liquids tonight and tomorrow. If pain does not increase and if you have no further episodes of vomiting blood, you may advance your diet on Thursday to a bland/BRAT diet (Bananas, Rice, Applesauce, and Toast).     For pain, you may try over the counter acetaminophen (tylenol) as directed, as needed. Do not exceed 3000mg  of tylenol in 24 hours from all sources (some pain meds and cough/cold meds contain tylenol). Tylenol overdose can result in liver failure and death.     For constipation: increase your walking. Don't take opioid pain medications. Stay well-hydrated, drink 6-8 glasses of water per day. Try over the counter colace and miralax as directed.    Follow-up in the [facility] Internal Medicine clinic with Dr Luiz Ochoa on Friday December 6th at 230pm. Please arrive 15 min early to complete paperwork. Take the silver elevators in the main lobby here at [facility] to the 5th floor.     Return immediately to the Emergency Department for: increasing pain, fever or chills, vomiting blood, coughing up blood, dizziness/lightheadedness, passing out/fainting/losing consciousness, or if you develop any other concerning symptoms

## 2018-11-07 ENCOUNTER — Encounter: Payer: Self-pay | Admitting: Emergency Medicine

## 2018-11-07 ENCOUNTER — Emergency Department
Admission: EM | Admit: 2018-11-07 | Discharge: 2018-11-07 | Disposition: A | Discharge status: Home / Self Care | Payer: MEDICAID | Source: Ambulatory Visit | Attending: Emergency Medicine | Admitting: Emergency Medicine

## 2018-11-07 DIAGNOSIS — R1013 Epigastric pain: Secondary | ICD-10-CM

## 2018-11-07 DIAGNOSIS — Z3202 Encounter for pregnancy test, result negative: Secondary | ICD-10-CM | POA: Insufficient documentation

## 2018-11-07 DIAGNOSIS — R112 Nausea with vomiting, unspecified: Secondary | ICD-10-CM

## 2018-11-07 DIAGNOSIS — R55 Syncope and collapse: Secondary | ICD-10-CM

## 2018-11-07 DIAGNOSIS — Z9049 Acquired absence of other specified parts of digestive tract: Secondary | ICD-10-CM | POA: Insufficient documentation

## 2018-11-07 DIAGNOSIS — R109 Unspecified abdominal pain: Secondary | ICD-10-CM

## 2018-11-07 DIAGNOSIS — R42 Dizziness and giddiness: Secondary | ICD-10-CM | POA: Insufficient documentation

## 2018-11-07 DIAGNOSIS — Z9889 Other specified postprocedural states: Secondary | ICD-10-CM | POA: Insufficient documentation

## 2018-11-07 DIAGNOSIS — R0602 Shortness of breath: Secondary | ICD-10-CM | POA: Insufficient documentation

## 2018-11-07 DIAGNOSIS — K920 Hematemesis: Secondary | ICD-10-CM | POA: Insufficient documentation

## 2018-11-07 LAB — CBC AND DIFFERENTIAL
Baso # K/uL: 0 10*3/uL (ref 0.0–0.1)
Basophil %: 0.4 %
Eos # K/uL: 0.1 10*3/uL (ref 0.0–0.4)
Eosinophil %: 1.1 %
Hematocrit: 33 % — ABNORMAL LOW (ref 34–45)
Hemoglobin: 9.8 g/dL — ABNORMAL LOW (ref 11.2–15.7)
IMM Granulocytes #: 0 10*3/uL
IMM Granulocytes: 0.4 %
Lymph # K/uL: 1.6 10*3/uL (ref 1.2–3.7)
Lymphocyte %: 20.3 %
MCH: 26 pg (ref 26–32)
MCHC: 30 g/dL — ABNORMAL LOW (ref 32–36)
MCV: 87 fL (ref 79–95)
Mono # K/uL: 0.5 10*3/uL (ref 0.2–0.9)
Monocyte %: 6.5 %
Neut # K/uL: 5.6 10*3/uL (ref 1.6–6.1)
Nucl RBC # K/uL: 0 10*3/uL (ref 0.0–0.0)
Nucl RBC %: 0 /100 WBC (ref 0.0–0.2)
Platelets: 418 10*3/uL — ABNORMAL HIGH (ref 160–370)
RBC: 3.8 MIL/uL — ABNORMAL LOW (ref 3.9–5.2)
RDW: 16 % — ABNORMAL HIGH (ref 11.7–14.4)
Seg Neut %: 71.3 %
WBC: 7.9 10*3/uL (ref 4.0–10.0)

## 2018-11-07 LAB — BASIC METABOLIC PANEL
Anion Gap: 12 (ref 7–16)
CO2: 26 mmol/L (ref 20–28)
Calcium: 9.5 mg/dL (ref 8.8–10.2)
Chloride: 102 mmol/L (ref 96–108)
Creatinine: 0.9 mg/dL (ref 0.51–0.95)
GFR,Black: 97 *
GFR,Caucasian: 84 *
Glucose: 93 mg/dL (ref 60–99)
Lab: 6 mg/dL (ref 6–20)
Potassium: 3.9 mmol/L (ref 3.3–5.1)
Sodium: 140 mmol/L (ref 133–145)

## 2018-11-07 LAB — RUQ PANEL (ED ONLY)
ALT: 9 U/L (ref 0–35)
AST: 11 U/L (ref 0–35)
Albumin: 4.2 g/dL (ref 3.5–5.2)
Alk Phos: 53 U/L (ref 35–105)
Amylase: 33 U/L (ref 28–100)
Bilirubin,Direct: <0.2 mg/dL (ref 0.0–0.3)
Bilirubin,Total: <0.2 mg/dL (ref 0.0–1.2)
Globulin: 2.9 g/dL (ref 2.7–4.3)
Lipase: 10 U/L — ABNORMAL LOW (ref 13–60)
Total Protein: 7.1 g/dL (ref 6.3–7.7)

## 2018-11-07 LAB — PREGNANCY TEST, SERUM: Preg,Serum: NEGATIVE

## 2018-11-07 LAB — POCT OCCULT BLOOD STOOL
Lot #: 891
Occult blood stool, POCT: NEGATIVE

## 2018-11-07 MED ORDER — PANTOPRAZOLE SODIUM 40 MG IV SOLR *I*
40.0000 mg | Freq: Once | INTRAVENOUS | Status: AC
Start: 2018-11-07 — End: 2018-11-07
  Administered 2018-11-07: 40 mg via INTRAVENOUS
  Filled 2018-11-07: qty 10

## 2018-11-07 MED ORDER — ONDANSETRON HCL 2 MG/ML IV SOLN *I*
4.0000 mg | Freq: Once | INTRAMUSCULAR | Status: AC
Start: 2018-11-07 — End: 2018-11-07
  Administered 2018-11-07: 4 mg via INTRAVENOUS
  Filled 2018-11-07: qty 2

## 2018-11-07 MED ORDER — SODIUM CHLORIDE 0.9 % FLUSH FOR PUMPS *I*
0.0000 mL/h | INTRAVENOUS | Status: DC | PRN
Start: 2018-11-07 — End: 2018-11-08

## 2018-11-07 MED ORDER — MORPHINE SULFATE 2 MG/ML IV SOLN *WRAPPED*
2.0000 mg | Freq: Once | Status: AC
Start: 2018-11-07 — End: 2018-11-07
  Administered 2018-11-07: 2 mg via INTRAVENOUS
  Filled 2018-11-07: qty 1

## 2018-11-07 MED ORDER — SODIUM CHLORIDE 0.9 % IV BOLUS *I*
1000.0000 mL | Freq: Once | Status: AC
Start: 2018-11-07 — End: 2018-11-07
  Administered 2018-11-07: 1000 mL via INTRAVENOUS

## 2018-11-07 MED ORDER — ONDANSETRON 4 MG PO TBDP *I*
4.0000 mg | ORAL_TABLET | Freq: Three times a day (TID) | ORAL | 0 refills | Status: DC | PRN
Start: 2018-11-07 — End: 2019-08-26

## 2018-11-07 MED ORDER — DEXTROSE 5 % FLUSH FOR PUMPS *I*
0.0000 mL/h | INTRAVENOUS | Status: DC | PRN
Start: 2018-11-07 — End: 2018-11-08

## 2018-11-07 NOTE — Discharge Instructions (Signed)
You were evaluated today for upper abdominal pain and vomiting blood. Your blood counts appear to be stable and even slightly improved from when you left the hospital on 11/26, which means you are not losing blood. There is no traces of blood in your stool, and the rest of your blood tests are normal. You can take Zofran for any symptoms of ongoing nausea, and follow up with your doctor as scheduled next week. Return to the hospital for any new or worsening symptoms.

## 2018-11-07 NOTE — ED Triage Notes (Signed)
Pt states she has been feeling dizzy, experiencing hematemesis, generalized body aches and abdominal pain since Monday. States appx 1 hour PTA she had a syncopal episode. Denies head strike.     Triage Note   Annette Preston

## 2018-11-07 NOTE — ED Notes (Addendum)
Pt to ED with abdominal pain, hematemesis, body aches, and reports that today she had syncopal episode. Pt states that she has been vomtiing blood since Monday. Pt reports that she was seen at [facility] Monday and d/c Tuesday. Pt reports that she has had 6-7 episodes of blood in vomit per day since Monday. Pt reporting abdominal pain and body aches 9/10. Pt denies any changes in bowel movements. Pt reports today she was walking in her home, became very dizzy, and had to sit down. Pt reports that she lost consciousness but also states that she was sitting down for 10 minutes. Syncopal episode was not witnessed by anyone. Pt reports that she is still dizzy. Pt denies any frank blood or dark stools. IV places, labs drawn and sent, and pt medicated per MAR.

## 2018-11-07 NOTE — ED Provider Notes (Signed)
History     Chief Complaint   Patient presents with    Syncope    Generalized Body Aches     32 year old female with past medical history of cholecystectomy presents for evaluation of hematemesis.  The patient states that for the last 4 days she's been vomiting bright red blood, approximately 5-6 times a day.  Patient was admitted to [facility] observation unit on 11/25.  She underwent an upper endoscopy on 11/26, which was completely normal.  She was discharged home in stable hematocrit.  Since then she states continued to throw up blood.  Occurs multiple times a day, is not associated with food.  She also endorses ongoing upper abdominal pain, which has not gone away since.  He denies any black, tarry, or bloody stools.  She denies any prior history of peptic ulcer disease.  She recently moved here from [place].  She has an appointment with GI for follow-up scheduled for 12/6.  Today she states that while she was ambulating around the house alone she became lightheaded and fainted.  She fell onto the couch, did not hit her head.  There was nobody around.  She shows me a picture on her phone of a puddle of blood on the floor, and states this is what she has been throwing up.  She denies any cough or chest pain.  She denies any prior history of EtOH use or liver disease.  No history of coagulopathy.  She does not take blood thinners.      History provided by:  Patient      Medical/Surgical/Family History     Past Medical History:   Diagnosis Date    Anemia     Depression     GI bleed     hemetesis        Patient Active Problem List   Diagnosis Code    Anemia D64.9            Past Surgical History:   Procedure Laterality Date    CESAREAN SECTION, UNSPECIFIED      CHOLECYSTECTOMY      GALLBLADDER SURGERY       Family History   Problem Relation Age of Onset    Colon cancer Paternal Grandmother           Social History     Tobacco Use    Smoking status: Never Smoker    Smokeless tobacco: Never Used    Substance Use Topics    Alcohol use: Not Currently     Frequency: Never    Drug use: Never     Living Situation     Questions Responses    Patient lives with Family    Homeless     Caregiver for other family member     External Services     Employment Employed    Domestic Violence Risk No                Review of Systems   Review of Systems   Constitutional: Negative for fever.   HENT: Negative for congestion and nosebleeds.    Respiratory: Positive for shortness of breath. Negative for cough.    Cardiovascular: Negative for chest pain, palpitations and leg swelling.   Gastrointestinal: Positive for abdominal pain, nausea and vomiting. Negative for blood in stool, constipation and diarrhea.   Genitourinary: Negative for dysuria.   Allergic/Immunologic: Negative for immunocompromised state.   Neurological: Positive for syncope and light-headedness.   Hematological: Does not bruise/bleed easily.  Psychiatric/Behavioral: Negative for confusion.       Physical Exam     Triage Vitals  Triage Start: Start, (11/07/18 1629)   First Recorded BP: (!) 133/91, Resp: 16, Temp: 36.1 C (97 F), Temp src: TEMPORAL Oxygen Therapy SpO2: 97 %, O2 Device: None (Room air), Heart Rate: 102, (11/07/18 1632)  .  First Pain Reported  0-10 Scale: 8, (11/07/18 2993)       Physical Exam  Vitals signs and nursing note reviewed.   Constitutional:       General: She is not in acute distress.     Appearance: She is well-developed.   HENT:      Head: Normocephalic and atraumatic.   Eyes:      Conjunctiva/sclera: Conjunctivae normal.   Neck:      Musculoskeletal: Neck supple.   Cardiovascular:      Rate and Rhythm: Normal rate and regular rhythm.      Heart sounds: Normal heart sounds.   Pulmonary:      Effort: Pulmonary effort is normal.      Breath sounds: Normal breath sounds.   Abdominal:      General: Bowel sounds are normal. There is no distension.      Palpations: Abdomen is soft.      Tenderness: There is tenderness (Mild  midepigastric). There is no guarding or rebound.   Genitourinary:     Comments: Stool is light brown on rectal exam.  No hemorrhoids or fissures noted.  Musculoskeletal: Normal range of motion.         General: No deformity.   Skin:     General: Skin is warm and dry.   Neurological:      Mental Status: She is alert and oriented to person, place, and time.      Comments: Moves all 4 extremities without focal neurologic deficits   Psychiatric:         Behavior: Behavior normal.         Medical Decision Making   Patient seen by me on:  11/07/2018    Assessment:  32 year old female presents with gross hematemesis last 4 days.  Today she had associated lightheadedness and fainting spell, but this was unwitnessed.  She reports her only med is an iron supplements.  She has a history of cholecystectomy.  She is endorsing ongoing upper abdominal pain in association with this.    Findings of endoscopy on 11/26 as follows:  Findings:     Esophagus:   Normal      Stomach:   Normal      Duodenum/small bowel:   Normal    Intervention(s):   None    Complication(s):   none    EBL: 0 ml    Impression(s):  1. Normal examination    Differential diagnosis:  Hematemesis of unclear etiology, malingering, munchausen's.   No findings on EGD to suggest upper GI problem.   Unlikely hemoptysis, she is adamant that she has no cough.     Plan:  Orders Placed This Encounter      CBC and differential      Basic metabolic panel      RUQ panel (ED only)      Pregnancy Test, Serum      POCT occult blood stool      Insert peripheral IV      ED Course and Disposition:  Labs are unremarkable. H/H is slightly improved from hospital d/c. No indication of ongoing GI bleeding.   F/u  with PCP scheduled for 12/6. No further appts with GI at this time. She should follow up as scheduled, at this time no indication for further imaging or testing.            Jonna Munro, MD          Jonna Munro, MD  11/07/18 [phone]

## 2018-11-14 LAB — UNMAPPED LAB RESULTS
ABO RH Blood Type (HT): A POS — NL
Antibody Screen (HT): NEGATIVE — NL
Basophil # (HT): 0 10 3/uL — NL (ref 0.0–0.2)
Basophil % (HT): 0 % — NL (ref 0–3)
Eosinophil # (HT): 0 10 3/uL — NL (ref 0.0–0.6)
Eosinophil % (HT): 0 % — NL (ref 0–5)
Hematocrit (HT): 35 % — NL (ref 35–47)
Hemoglobin (HGB) (HT): 10.5 g/dL — ABNORMAL LOW (ref 12.0–16.0)
Lymphocyte # (HT): 1.5 10 3/uL — NL (ref 1.0–4.8)
Lymphocyte % (HT): 20 % — NL (ref 15–45)
MCHC (HT): 30 g/dL — ABNORMAL LOW (ref 31.0–37.5)
MCV (HT): 85 fL — NL (ref 80–100)
Mean Corpuscular Hemoglobin (MCH) (HT): 25.4 pg — ABNORMAL LOW (ref 26.0–34.0)
Monocyte # (HT): 0.3 10 3/uL — NL (ref 0.1–1.0)
Monocyte % (HT): 4 % — NL (ref 0–15)
Neutrophil # (HT): 5.6 10 3/uL — NL (ref 1.8–8.0)
Platelets (HT): 527 10 3/uL — ABNORMAL HIGH (ref 150–450)
RBC (HT): 4.14 10 6/uL — NL (ref 3.80–5.20)
RDW (HT): 15.5 % — ABNORMAL HIGH (ref 0.0–15.2)
Seg Neut % (HT): 75 % — NL (ref 45–75)
WBC (HT): 7.5 10 3/uL — NL (ref 4.0–11.0)

## 2018-11-15 ENCOUNTER — Ambulatory Visit: Payer: Self-pay | Admitting: Student in an Organized Health Care Education/Training Program

## 2018-12-20 ENCOUNTER — Emergency Department
Admission: EM | Admit: 2018-12-20 | Discharge: 2018-12-21 | Disposition: A | Discharge status: Home / Self Care | Payer: MEDICAID | Source: Ambulatory Visit | Attending: Emergency Medicine | Admitting: Emergency Medicine

## 2018-12-20 ENCOUNTER — Encounter: Payer: Self-pay | Admitting: Student in an Organized Health Care Education/Training Program

## 2018-12-20 ENCOUNTER — Other Ambulatory Visit: Payer: Self-pay | Admitting: Cardiology

## 2018-12-20 ENCOUNTER — Emergency Department: Payer: MEDICAID

## 2018-12-20 DIAGNOSIS — R42 Dizziness and giddiness: Secondary | ICD-10-CM

## 2018-12-20 DIAGNOSIS — I517 Cardiomegaly: Secondary | ICD-10-CM

## 2018-12-20 DIAGNOSIS — I499 Cardiac arrhythmia, unspecified: Secondary | ICD-10-CM

## 2018-12-20 DIAGNOSIS — R197 Diarrhea, unspecified: Secondary | ICD-10-CM

## 2018-12-20 DIAGNOSIS — K920 Hematemesis: Secondary | ICD-10-CM

## 2018-12-20 DIAGNOSIS — R05 Cough: Secondary | ICD-10-CM

## 2018-12-20 LAB — CBC AND DIFFERENTIAL
Baso # K/uL: 0 10*3/uL (ref 0.0–0.1)
Basophil %: 0.3 %
Eos # K/uL: 0 10*3/uL (ref 0.0–0.4)
Eosinophil %: 0.5 %
Hematocrit: 34 % (ref 34–45)
Hemoglobin: 10 g/dL — ABNORMAL LOW (ref 11.2–15.7)
IMM Granulocytes #: 0 10*3/uL
IMM Granulocytes: 0.1 %
Lymph # K/uL: 2.3 10*3/uL (ref 1.2–3.7)
Lymphocyte %: 31.9 %
MCH: 25 pg/cell — ABNORMAL LOW (ref 26–32)
MCHC: 30 g/dL — ABNORMAL LOW (ref 32–36)
MCV: 84 fL (ref 79–95)
Mono # K/uL: 0.3 10*3/uL (ref 0.2–0.9)
Monocyte %: 4.2 %
Neut # K/uL: 4.6 10*3/uL (ref 1.6–6.1)
Nucl RBC # K/uL: 0 10*3/uL (ref 0.0–0.0)
Nucl RBC %: 0 /100 WBC (ref 0.0–0.2)
Platelets: 484 10*3/uL — ABNORMAL HIGH (ref 160–370)
RBC: 4 MIL/uL (ref 3.9–5.2)
RDW: 15.7 % — ABNORMAL HIGH (ref 11.7–14.4)
Seg Neut %: 63 %
WBC: 7.3 10*3/uL (ref 4.0–10.0)

## 2018-12-20 LAB — UNABLE TO PERFORM ADD-ON TESTING 1

## 2018-12-20 LAB — RUQ PANEL (ED ONLY)
ALT: 20 U/L (ref 0–35)
AST: 24 U/L (ref 0–35)
Albumin: 4.7 g/dL (ref 3.5–5.2)
Alk Phos: 64 U/L (ref 35–105)
Amylase: 52 U/L (ref 28–100)
Bilirubin,Direct: <0.2 mg/dL (ref 0.0–0.3)
Bilirubin,Total: <0.2 mg/dL (ref 0.0–1.2)
Lipase: 22 U/L (ref 13–60)
Total Protein: 7.9 g/dL — ABNORMAL HIGH (ref 6.3–7.7)

## 2018-12-20 LAB — BLOOD BANK HOLD RED

## 2018-12-20 LAB — PREGNANCY TEST, SERUM: Preg,Serum: NEGATIVE

## 2018-12-20 LAB — BASIC METABOLIC PANEL
Anion Gap: 15 (ref 7–16)
CO2: 24 mmol/L (ref 20–28)
Calcium: 9.6 mg/dL (ref 8.8–10.2)
Chloride: 96 mmol/L (ref 96–108)
Creatinine: 0.62 mg/dL (ref 0.51–0.95)
GFR,Black: 137 *
GFR,Caucasian: 119 *
Glucose: 90 mg/dL (ref 60–99)
Lab: 12 mg/dL (ref 6–20)
Potassium: 4.6 mmol/L (ref 3.3–5.1)
Sodium: 135 mmol/L (ref 133–145)

## 2018-12-20 LAB — HOLD BLUE

## 2018-12-20 LAB — HOLD GREEN WITH GEL

## 2018-12-20 LAB — BLOOD BANK HOLD LAVENDER

## 2018-12-20 MED ORDER — ONDANSETRON HCL 4 MG PO TABS *I*
4.0000 mg | ORAL_TABLET | Freq: Three times a day (TID) | ORAL | 0 refills | Status: DC | PRN
Start: 2018-12-20 — End: 2019-08-26

## 2018-12-20 MED ORDER — PROMETHAZINE HCL 25 MG/ML IJ SOLN *I*
12.5000 mg | Freq: Once | INTRAMUSCULAR | Status: AC
Start: 2018-12-20 — End: 2018-12-20
  Administered 2018-12-20: 12.5 mg via INTRAVENOUS
  Filled 2018-12-20: qty 1

## 2018-12-20 MED ORDER — OXYCODONE HCL 5 MG PO TABS *I*
5.0000 mg | ORAL_TABLET | Freq: Once | ORAL | Status: AC
Start: 2018-12-20 — End: 2018-12-20
  Administered 2018-12-20: 5 mg via ORAL
  Filled 2018-12-20: qty 1

## 2018-12-20 MED ORDER — SODIUM CHLORIDE 0.9 % IV BOLUS *I*
1000.0000 mL | Freq: Once | Status: AC
Start: 2018-12-20 — End: 2018-12-20
  Administered 2018-12-20: 1000 mL via INTRAVENOUS

## 2018-12-20 MED ORDER — PANTOPRAZOLE SODIUM 40 MG IV SOLR *I*
40.0000 mg | Freq: Once | INTRAVENOUS | Status: AC
Start: 2018-12-20 — End: 2018-12-20
  Administered 2018-12-20: 40 mg via INTRAVENOUS
  Filled 2018-12-20: qty 10

## 2018-12-20 MED ORDER — PANTOPRAZOLE SODIUM 20 MG PO TBEC *I*
20.0000 mg | DELAYED_RELEASE_TABLET | Freq: Every day | ORAL | 0 refills | Status: AC
Start: 2018-12-20 — End: 2019-01-19

## 2018-12-20 MED ORDER — FAMOTIDINE (PF) 20 MG/2ML IV SOLN *I*
20.0000 mg | Freq: Once | INTRAVENOUS | Status: AC
Start: 2018-12-20 — End: 2018-12-20
  Administered 2018-12-20: 20 mg via INTRAVENOUS
  Filled 2018-12-20: qty 2

## 2018-12-20 MED ORDER — ACETAMINOPHEN 500 MG PO TABS *I*
1000.0000 mg | ORAL_TABLET | Freq: Once | ORAL | Status: AC
Start: 2018-12-20 — End: 2018-12-20
  Administered 2018-12-20: 1000 mg via ORAL
  Filled 2018-12-20: qty 2

## 2018-12-20 MED ORDER — ONDANSETRON HCL 2 MG/ML IV SOLN *I*
4.0000 mg | Freq: Once | INTRAMUSCULAR | Status: AC
Start: 2018-12-20 — End: 2018-12-20
  Administered 2018-12-20: 4 mg via INTRAVENOUS
  Filled 2018-12-20: qty 2

## 2018-12-20 MED ORDER — SODIUM CHLORIDE 0.9 % IV SOLN WRAPPED *I*
125.0000 mL/h | Status: DC
Start: 2018-12-20 — End: 2018-12-21
  Administered 2018-12-20 (×2): 125 mL/h via INTRAVENOUS

## 2018-12-20 NOTE — ED Provider Notes (Addendum)
History     Chief Complaint   Patient presents with    Abdominal Pain    Hematemesis    Syncope     Patient is a 33 y/o female with a hx of anemia who presents after hematemesis. Patient says she felt lightheaded and then passed out. Says she was unconscious for several minutes and when she woke up she felt nauseous. Then had 11 episodes of hematemesis, bright red blood streaking through the more recent episodes. Last episode was in waiting room of ED. Is having lower abdominal pain. This happened in late November where she was admitted to observation. Evaluated by GI and had EGD which was unremarkable. Had been without hematemesis for the previous two weeks to today. Has had several episodes, less than five a day, of diarrhea. Denies bright red blood or dark stools. Is lightheaded upon standing. Denies chest pain or shortness of breath. Reports subjective fevers and chills, worse at night. Reports cough for past several days.     Had received pepcid, zofran and protonix prior to evaluation. Says this has not improved her pain or nausea.     Has grandmother with bleeding disorder, unsure which disorder.           Medical/Surgical/Family History     Past Medical History:   Diagnosis Date    Anemia     Depression     GI bleed     hemetesis        Patient Active Problem List   Diagnosis Code    Anemia D64.9            Past Surgical History:   Procedure Laterality Date    CESAREAN SECTION, UNSPECIFIED      CHOLECYSTECTOMY      GALLBLADDER SURGERY       Family History   Problem Relation Age of Onset    Colon cancer Paternal Grandmother           Social History     Tobacco Use    Smoking status: Never Smoker    Smokeless tobacco: Never Used   Substance Use Topics    Alcohol use: Not Currently     Frequency: Never    Drug use: Never     Living Situation     Questions Responses    Patient lives with Family    Homeless     Caregiver for other family member     External Services     Employment Employed     Domestic Violence Risk No                Review of Systems   Review of Systems   Constitutional: Positive for fever (subjective). Negative for fatigue.   HENT: Negative for rhinorrhea.    Eyes: Negative for visual disturbance.   Respiratory: Positive for cough. Negative for shortness of breath.    Cardiovascular: Negative for chest pain.   Gastrointestinal: Positive for abdominal pain, diarrhea, nausea and vomiting.   Genitourinary: Negative for dysuria.   Musculoskeletal: Negative for back pain.   Skin: Negative for pallor.   Allergic/Immunologic: Negative for immunocompromised state.   Neurological: Positive for syncope and light-headedness.   Hematological: Does not bruise/bleed easily.   Psychiatric/Behavioral: Negative for confusion.       Physical Exam     Triage Vitals  Triage Start: Start, (12/20/18 1413)   First Recorded BP: 127/88, Resp: 16, Temp: 36.6 C (97.9 F), Temp src: TEMPORAL Oxygen Therapy SpO2: 98 %,  O2 Device: None (Room air), Heart Rate: 87, (12/20/18 1417)  .  First Pain Reported  0-10 Scale: 8, (12/20/18 1417)       Physical Exam  Vitals signs and nursing note reviewed.   Constitutional:       Appearance: She is well-developed.      Comments: Well appearing, not in acute distress. Afebrile. Vitals are within normal limits.   HENT:      Head: Normocephalic and atraumatic.      Mouth/Throat:      Mouth: Mucous membranes are moist.   Eyes:      Pupils: Pupils are equal, round, and reactive to light.   Cardiovascular:      Rate and Rhythm: Normal rate and regular rhythm.      Heart sounds: Normal heart sounds.   Pulmonary:      Effort: Pulmonary effort is normal.      Breath sounds: Normal breath sounds.      Comments: Lungs clear, no increased wob.  Abdominal:      General: Abdomen is flat. Bowel sounds are normal. There is no distension.      Palpations: Abdomen is soft.      Tenderness: There is no abdominal tenderness. There is no guarding or rebound.      Comments: Soft and non-tender to  palpation. No rebound or guarding. Obese.   Skin:     General: Skin is warm and dry.      Capillary Refill: Capillary refill takes less than 2 seconds.   Neurological:      General: No focal deficit present.      Mental Status: She is alert and oriented to person, place, and time.   Psychiatric:         Mood and Affect: Mood normal.         Medical Decision Making   Patient seen by me on:  12/20/2018    Assessment:  Patient is a 33 y/o female with hematemesis with normal EGD done less than two months ago. Sx started after multiple episodes of emesis - most likely mallory weis tear in the setting of ?gastroenteritis given her diarrhea and vomiting. Concern for PUD, gastritis, mallory Weis tear, esophagitis.     Differential diagnosis:  PUD, gastritis, mallory Weis tear, esophagitis, hemoptysis (less suspicion), gastroenteritis, colitis. Low suspicion for [place].     Plan:  Pepcid, zofran and protonix given by first provider; Tylenol and phenergan given for additional additional pain relief and antiemetic. CBC, ED7,     ED Course and Disposition:  Patient able to ambulate to bathroom without dizziness. Pain improved with analgesia. Blood work unremarkable. H/H near baseline. Vitals normal. Requesting to go home. Patient discharged to home with appropriate return precautions and recs to f/u with PCP and GI within one week.     We reviewed her  ED work up, diagnosis and management plan as well as ED return precautions and follow up instructions. She expressed understanding and agreement. This information was also provided in written format in d/c instructions which were reviewed again with nursing staff. Plan for d/c home at this time.        ED Course as of Dec 20 2334   Fri Dec 20, 2018   2050 BP: 127/88   2050 Heart Rate: 87   2050 SpO2: 98 %   2050 Baseline 30-32   Hematocrit: 34   2302 No acute cardiopulmonary disease.   *Chest standard frontal and lateral views  2302 Continuing to c/o pain and nausea. Tylenol  helped, but did not relieve pain. Phenergan and 5 mg oxycodone ordered.           Wendee Beavers, MD    Resident Attestation:    Patient seen by me on 12/20/2018.    History:  I reviewed this patient, reviewed the resident's note and agree with edits above.    Exam:  I examined this patient, reviewed the resident's note and agree with edits above.    Decision Making:  I discussed with the resident his/her documented decision making and agree with edits above.      Author:  Alexis Frock, MD       Wendee Beavers, MD  Resident  12/20/18 4709       Alexis Frock, MD  12/23/18 [phone]

## 2018-12-20 NOTE — First Provider Contact (Signed)
ED First Provider Contact Note    Initial provider evaluation performed by   ED First Provider Contact     Date/Time Event User Comments    12/20/18 1422 ED First Provider Contact Gale Journey, [facility] ANN Initial Face to Face Provider Contact        Hematemesis, syncope today  Vital signs reviewed.    Assessment: reported hematemesis     Orders placed:  LABS and NPO, IVF's, Protonix, Pepcid     Patient requires further evaluation.     Zyria Fiscus ANN [place], NP, 12/20/2018, 2:22 PM     Nevyn Bossman, Audrea Muscat, NP  12/20/18 1425

## 2018-12-20 NOTE — Discharge Instructions (Signed)
You were seen in the ED for hematemesis (vomiting blood). Your blood work was normal. Your pain and nausea improved with medication. Please follow up with your PCP and GI doctor within one week for further evaluation. Please return to the ED if you develop chest pain, shortness of breath, pass out, have further episodes of hematemesis or any other symptoms that cause you concern.

## 2018-12-20 NOTE — ED Triage Notes (Signed)
C/o vomiting blood this am with abdominal pain. Patient reports that she passed out this am for about 15 minutes. Denies LOC. EKG in triage.        Triage Note   Dia Crawford, RN

## 2018-12-20 NOTE — ED Notes (Signed)
Presented after having hematemesis episodes yesterday and today. Stating she is not on blood thinners or blood pressure meds at home. States this has happened before in the past, unsure what was causing it. Endorses light headedness on ambulation. Denies CP/fevers. Family at the bedside stating she passed out today for approx 10-15 minutes. Did not hit her head, family member helped lower her to the ground. Hx includes anemia and GI bleed.

## 2018-12-20 NOTE — ED Notes (Signed)
OTF to xray

## 2018-12-21 NOTE — ED Notes (Signed)
D/c paperwork reviewed with pt. Verbalized understanding, no questions at this time. All clothing and belongings with pt at time of d/c. VSS, AAO4, ambulatory. Friend will be driving pt home.

## 2018-12-22 LAB — UNMAPPED LAB RESULTS
ABO RH Blood Type (HT): A POS — NL
Antibody Screen (HT): NEGATIVE — NL
Basophil # (HT): 0.1 10 3/uL — NL (ref 0.0–0.2)
Basophil % (HT): 1 % — NL (ref 0–3)
Eosinophil # (HT): 0.1 10 3/uL — NL (ref 0.0–0.6)
Eosinophil % (HT): 1 % — NL (ref 0–5)
Hematocrit (HT): 30 % — ABNORMAL LOW (ref 35–47)
Hematocrit (HT): 27 % — ABNORMAL LOW (ref 35–47)
Hemoglobin (HGB) (HT): 9 g/dL — ABNORMAL LOW (ref 12.0–16.0)
Hemoglobin (HGB) (HT): 8.2 g/dL — ABNORMAL LOW (ref 12.0–16.0)
Lymphocyte # (HT): 3 10 3/uL — NL (ref 1.0–4.8)
Lymphocyte % (HT): 36 % — NL (ref 15–45)
MCHC (HT): 29.9 g/dL — ABNORMAL LOW (ref 31.0–37.5)
MCV (HT): 83 fL — NL (ref 80–100)
Mean Corpuscular Hemoglobin (MCH) (HT): 24.8 pg — ABNORMAL LOW (ref 26.0–34.0)
Monocyte # (HT): 0.5 10 3/uL — NL (ref 0.1–1.0)
Monocyte % (HT): 5 % — NL (ref 0–15)
Neutrophil # (HT): 4.7 10 3/uL — NL (ref 1.8–8.0)
Platelets (HT): 446 10 3/uL — NL (ref 150–450)
RBC (HT): 3.63 10 6/uL — ABNORMAL LOW (ref 3.80–5.20)
RDW (HT): 15.7 % — ABNORMAL HIGH (ref 0.0–15.2)
Seg Neut % (HT): 57 % — NL (ref 45–75)
WBC (HT): 8.3 10 3/uL — NL (ref 4.0–11.0)

## 2018-12-23 LAB — UNMAPPED LAB RESULTS
Basophil # (HT): 0 10 3/uL — NL (ref 0.0–0.2)
Basophil % (HT): 1 % — NL (ref 0–3)
Eosinophil # (HT): 0 10 3/uL — NL (ref 0.0–0.6)
Eosinophil % (HT): 1 % — NL (ref 0–5)
Hematocrit (HT): 28 % — ABNORMAL LOW (ref 35–47)
Hemoglobin (HGB) (HT): 8.5 g/dL — ABNORMAL LOW (ref 12.0–16.0)
Lymphocyte # (HT): 1.4 10 3/uL — NL (ref 1.0–4.8)
Lymphocyte % (HT): 23 % — NL (ref 15–45)
MCHC (HT): 30 g/dL — ABNORMAL LOW (ref 31.0–37.5)
MCV (HT): 83 fL — NL (ref 80–100)
Mean Corpuscular Hemoglobin (MCH) (HT): 24.9 pg — ABNORMAL LOW (ref 26.0–34.0)
Monocyte # (HT): 0.3 10 3/uL — NL (ref 0.1–1.0)
Monocyte % (HT): 5 % — NL (ref 0–15)
Neutrophil # (HT): 4.3 10 3/uL — NL (ref 1.8–8.0)
Platelets (HT): 428 10 3/uL — NL (ref 150–450)
RBC (HT): 3.42 10 6/uL — ABNORMAL LOW (ref 3.80–5.20)
RDW (HT): 15.8 % — ABNORMAL HIGH (ref 0.0–15.2)
Seg Neut % (HT): 70 % — NL (ref 45–75)
WBC (HT): 6.2 10 3/uL — NL (ref 4.0–11.0)

## 2018-12-23 LAB — EKG 12-LEAD
P: 32 deg
PR: 141 ms
QRS: 58 deg
QRSD: 98 ms
QT: 370 ms
QTc: 456 ms
Rate: 91 {beats}/min
T: 117 deg

## 2018-12-24 LAB — UNMAPPED LAB RESULTS
Hematocrit (HT): 29 % — ABNORMAL LOW (ref 35–47)
Hemoglobin (HGB) (HT): 8.5 g/dL — ABNORMAL LOW (ref 12.0–16.0)
MCHC (HT): 29.3 g/dL — ABNORMAL LOW (ref 31.0–37.5)
MCV (HT): 84 fL — NL (ref 80–100)
Mean Corpuscular Hemoglobin (MCH) (HT): 24.5 pg — ABNORMAL LOW (ref 26.0–34.0)
Platelets (HT): 437 10 3/uL — NL (ref 150–450)
RBC (HT): 3.47 10 6/uL — ABNORMAL LOW (ref 3.80–5.20)
RDW (HT): 15.5 % — ABNORMAL HIGH (ref 0.0–15.2)
WBC (HT): 6 10 3/uL — NL (ref 4.0–11.0)

## 2018-12-27 LAB — UNMAPPED LAB RESULTS
ABO RH Blood Type (HT): A POS — NL
Antibody Screen (HT): NEGATIVE — NL
Basophil # (HT): 0 10 3/uL — NL (ref 0.0–0.2)
Basophil % (HT): 1 % — NL (ref 0–3)
Eosinophil # (HT): 0 10 3/uL — NL (ref 0.0–0.6)
Eosinophil % (HT): 1 % — NL (ref 0–5)
Hematocrit (HT): 31 % — ABNORMAL LOW (ref 35–47)
Hemoglobin (HGB) (HT): 9.5 g/dL — ABNORMAL LOW (ref 12.0–16.0)
Lymphocyte # (HT): 2.1 10 3/uL — NL (ref 1.0–4.8)
Lymphocyte % (HT): 35 % — NL (ref 15–45)
MCHC (HT): 30.3 g/dL — ABNORMAL LOW (ref 31.0–37.5)
MCV (HT): 83 fL — NL (ref 80–100)
Mean Corpuscular Hemoglobin (MCH) (HT): 25.2 pg — ABNORMAL LOW (ref 26.0–34.0)
Monocyte # (HT): 0.4 10 3/uL — NL (ref 0.1–1.0)
Monocyte % (HT): 6 % — NL (ref 0–15)
Neutrophil # (HT): 3.5 10 3/uL — NL (ref 1.8–8.0)
Platelets (HT): 516 10 3/uL — ABNORMAL HIGH (ref 150–450)
RBC (HT): 3.77 10 6/uL — ABNORMAL LOW (ref 3.80–5.20)
RDW (HT): 15.3 % — ABNORMAL HIGH (ref 0.0–15.2)
Seg Neut % (HT): 57 % — NL (ref 45–75)
WBC (HT): 6 10 3/uL — NL (ref 4.0–11.0)

## 2018-12-29 LAB — UNMAPPED LAB RESULTS
Basophil # (HT): 0 10 3/uL — NL (ref 0.0–0.2)
Basophil % (HT): 0 % — NL (ref 0–3)
Eosinophil # (HT): 0 10 3/uL — NL (ref 0.0–0.6)
Eosinophil % (HT): 0 % — NL (ref 0–5)
Hematocrit (HT): 31 % — ABNORMAL LOW (ref 35–47)
Hemoglobin (HGB) (HT): 9.1 g/dL — ABNORMAL LOW (ref 12.0–16.0)
Lymphocyte # (HT): 0.9 10 3/uL — ABNORMAL LOW (ref 1.0–4.8)
Lymphocyte % (HT): 11 % — ABNORMAL LOW (ref 15–45)
MCHC (HT): 29 g/dL — ABNORMAL LOW (ref 31.0–37.5)
MCV (HT): 83 fL — NL (ref 80–100)
Mean Corpuscular Hemoglobin (MCH) (HT): 24.1 pg — ABNORMAL LOW (ref 26.0–34.0)
Monocyte # (HT): 0.3 10 3/uL — NL (ref 0.1–1.0)
Monocyte % (HT): 4 % — NL (ref 0–15)
Neutrophil # (HT): 6.5 10 3/uL — NL (ref 1.8–8.0)
Platelets (HT): 460 10 3/uL — ABNORMAL HIGH (ref 150–450)
RBC (HT): 3.78 10 6/uL — ABNORMAL LOW (ref 3.80–5.20)
RDW (HT): 15.7 % — ABNORMAL HIGH (ref 0.0–15.2)
Seg Neut % (HT): 84 % — ABNORMAL HIGH (ref 45–75)
WBC (HT): 7.8 10 3/uL — NL (ref 4.0–11.0)

## 2019-01-03 LAB — UNMAPPED LAB RESULTS
Basophil # (HT): 0 10 3/uL — NL (ref 0.0–0.2)
Basophil % (HT): 0 % — NL (ref 0–3)
Eosinophil # (HT): 0.1 10 3/uL — NL (ref 0.0–0.6)
Eosinophil % (HT): 1 % — NL (ref 0–5)
Hematocrit (HT): 33 % — ABNORMAL LOW (ref 35–47)
Hemoglobin (HGB) (HT): 9.5 g/dL — ABNORMAL LOW (ref 12.0–16.0)
Lymphocyte # (HT): 1.8 10 3/uL — NL (ref 1.0–4.8)
Lymphocyte % (HT): 23 % — NL (ref 15–45)
MCHC (HT): 28.9 g/dL — ABNORMAL LOW (ref 31.0–37.5)
MCV (HT): 84 fL — NL (ref 80–100)
Mean Corpuscular Hemoglobin (MCH) (HT): 24.2 pg — ABNORMAL LOW (ref 26.0–34.0)
Monocyte # (HT): 0.5 10 3/uL — NL (ref 0.1–1.0)
Monocyte % (HT): 7 % — NL (ref 0–15)
Neutrophil # (HT): 5.4 10 3/uL — NL (ref 1.8–8.0)
Platelets (HT): 488 10 3/uL — ABNORMAL HIGH (ref 150–450)
RBC (HT): 3.93 10 6/uL — NL (ref 3.80–5.20)
RDW (HT): 16.4 % — ABNORMAL HIGH (ref 0.0–15.2)
Seg Neut % (HT): 70 % — NL (ref 45–75)
WBC (HT): 7.8 10 3/uL — NL (ref 4.0–11.0)

## 2019-01-28 ENCOUNTER — Emergency Department: Payer: Medicaid Other

## 2019-01-28 ENCOUNTER — Other Ambulatory Visit: Payer: Self-pay | Admitting: Cardiology

## 2019-01-28 ENCOUNTER — Inpatient Hospital Stay
Admission: EM | Admit: 2019-01-28 | Discharge: 2019-02-04 | DRG: 513 | Disposition: A | Discharge status: Home / Self Care | Payer: Medicaid Other | Source: Ambulatory Visit | Attending: Obstetrics and Gynecology | Admitting: Obstetrics and Gynecology

## 2019-01-28 ENCOUNTER — Encounter: Payer: Medicaid Other | Admitting: Cardiology

## 2019-01-28 ENCOUNTER — Encounter: Payer: Self-pay | Admitting: Emergency Medicine

## 2019-01-28 DIAGNOSIS — Z9889 Other specified postprocedural states: Secondary | ICD-10-CM

## 2019-01-28 DIAGNOSIS — E669 Obesity, unspecified: Secondary | ICD-10-CM | POA: Diagnosis present

## 2019-01-28 DIAGNOSIS — R109 Unspecified abdominal pain: Secondary | ICD-10-CM

## 2019-01-28 DIAGNOSIS — N7011 Chronic salpingitis: Secondary | ICD-10-CM | POA: Diagnosis present

## 2019-01-28 DIAGNOSIS — N7093 Salpingitis and oophoritis, unspecified: Secondary | ICD-10-CM

## 2019-01-28 DIAGNOSIS — R1033 Periumbilical pain: Secondary | ICD-10-CM

## 2019-01-28 DIAGNOSIS — R000 Tachycardia, unspecified: Secondary | ICD-10-CM

## 2019-01-28 DIAGNOSIS — N736 Female pelvic peritoneal adhesions (postinfective): Secondary | ICD-10-CM | POA: Diagnosis present

## 2019-01-28 DIAGNOSIS — R1032 Left lower quadrant pain: Secondary | ICD-10-CM

## 2019-01-28 DIAGNOSIS — Z6841 Body Mass Index (BMI) 40.0 and over, adult: Secondary | ICD-10-CM

## 2019-01-28 DIAGNOSIS — D649 Anemia, unspecified: Secondary | ICD-10-CM

## 2019-01-28 DIAGNOSIS — D62 Acute posthemorrhagic anemia: Secondary | ICD-10-CM | POA: Diagnosis present

## 2019-01-28 DIAGNOSIS — R0989 Other specified symptoms and signs involving the circulatory and respiratory systems: Secondary | ICD-10-CM

## 2019-01-28 DIAGNOSIS — K922 Gastrointestinal hemorrhage, unspecified: Secondary | ICD-10-CM

## 2019-01-28 DIAGNOSIS — N83512 Torsion of left ovary and ovarian pedicle: Secondary | ICD-10-CM | POA: Diagnosis present

## 2019-01-28 DIAGNOSIS — N83202 Unspecified ovarian cyst, left side: Secondary | ICD-10-CM

## 2019-01-28 DIAGNOSIS — N7001 Acute salpingitis: Principal | ICD-10-CM | POA: Diagnosis present

## 2019-01-28 DIAGNOSIS — N838 Other noninflammatory disorders of ovary, fallopian tube and broad ligament: Secondary | ICD-10-CM

## 2019-01-28 DIAGNOSIS — R1012 Left upper quadrant pain: Secondary | ICD-10-CM

## 2019-01-28 DIAGNOSIS — K920 Hematemesis: Secondary | ICD-10-CM | POA: Diagnosis present

## 2019-01-28 LAB — RUQ PANEL (ED ONLY)
ALT: 21 U/L (ref 0–35)
AST: 14 U/L (ref 0–35)
Albumin: 4.5 g/dL (ref 3.5–5.2)
Alk Phos: 74 U/L (ref 35–105)
Amylase: 42 U/L (ref 28–100)
Bilirubin,Direct: <0.2 mg/dL (ref 0.0–0.3)
Bilirubin,Total: <0.2 mg/dL (ref 0.0–1.2)
Globulin: 3.5 g/dL (ref 2.7–4.3)
Lipase: 14 U/L (ref 13–60)
Total Protein: 8 g/dL — ABNORMAL HIGH (ref 6.3–7.7)

## 2019-01-28 LAB — PREGNANCY TEST, SERUM: Preg,Serum: NEGATIVE

## 2019-01-28 LAB — CBC AND DIFFERENTIAL
Baso # K/uL: 0 10*3/uL (ref 0.0–0.1)
Basophil %: 0.2 %
Eos # K/uL: 0 10*3/uL (ref 0.0–0.4)
Eosinophil %: 0.2 %
Hematocrit: 32 % — ABNORMAL LOW (ref 34–45)
Hemoglobin: 9.4 g/dL — ABNORMAL LOW (ref 11.2–15.7)
IMM Granulocytes #: 0.1 10*3/uL
IMM Granulocytes: 0.5 %
Lymph # K/uL: 1.7 10*3/uL (ref 1.2–3.7)
Lymphocyte %: 13.5 %
MCH: 24 pg — ABNORMAL LOW (ref 26–32)
MCHC: 30 g/dL — ABNORMAL LOW (ref 32–36)
MCV: 82 fL (ref 79–95)
Mono # K/uL: 0.6 10*3/uL (ref 0.2–0.9)
Monocyte %: 4.9 %
Neut # K/uL: 9.9 10*3/uL — ABNORMAL HIGH (ref 1.6–6.1)
Nucl RBC # K/uL: 0 10*3/uL (ref 0.0–0.0)
Nucl RBC %: 0 /100 WBC (ref 0.0–0.2)
Platelets: 532 10*3/uL — ABNORMAL HIGH (ref 160–370)
RBC: 3.9 MIL/uL (ref 3.9–5.2)
RDW: 17.4 % — ABNORMAL HIGH (ref 11.7–14.4)
Seg Neut %: 80.7 %
WBC: 12.3 10*3/uL — ABNORMAL HIGH (ref 4.0–10.0)

## 2019-01-28 LAB — BASIC METABOLIC PANEL
Anion Gap: 13 (ref 7–16)
CO2: 25 mmol/L (ref 20–28)
Calcium: 9.5 mg/dL (ref 8.8–10.2)
Chloride: 100 mmol/L (ref 96–108)
Creatinine: 0.66 mg/dL (ref 0.51–0.95)
GFR,Black: 134 *
GFR,Caucasian: 116 *
Glucose: 102 mg/dL — ABNORMAL HIGH (ref 60–99)
Lab: 10 mg/dL (ref 6–20)
Potassium: 4.8 mmol/L (ref 3.3–5.1)
Sodium: 138 mmol/L (ref 133–145)

## 2019-01-28 LAB — PROTIME-INR
INR: 1 (ref 0.9–1.1)
Protime: 12.1 s (ref 10.0–12.9)

## 2019-01-28 LAB — URINALYSIS REFLEX TO CULTURE
Blood,UA: NEGATIVE
Ketones, UA: NEGATIVE
Leuk Esterase,UA: NEGATIVE
Nitrite,UA: NEGATIVE
Protein,UA: NEGATIVE mg/dL
Specific Gravity,UA: 1.038 — ABNORMAL HIGH (ref 1.001–1.030)
pH,UA: 5 (ref 5.0–8.0)

## 2019-01-28 LAB — RETICULOCYTES
Retic %: 1.9 % (ref 0.7–2.1)
Retic Abs: 73.5 10*3/uL (ref 29.9–90.9)

## 2019-01-28 LAB — APTT: aPTT: 34.6 s (ref 25.8–37.9)

## 2019-01-28 LAB — TYPE AND SCREEN
ABO RH Blood Type: A POS
Antibody Screen: NEGATIVE

## 2019-01-28 LAB — RBC MORPHOLOGY: RBC Morphology: NORMAL

## 2019-01-28 MED ORDER — HYDROMORPHONE HCL PF 1 MG/ML IJ SOLN *WRAPPED*
1.0000 mg | Freq: Once | INTRAMUSCULAR | Status: AC
Start: 2019-01-28 — End: 2019-01-28
  Administered 2019-01-28: 1 mg via INTRAVENOUS
  Filled 2019-01-28: qty 1

## 2019-01-28 MED ORDER — HYDROMORPHONE HCL PF 0.5 MG/0.5 ML IJ SOLN *I*
0.5000 mg | Freq: Once | INTRAMUSCULAR | Status: AC
Start: 2019-01-28 — End: 2019-01-28
  Administered 2019-01-28: 0.5 mg via INTRAVENOUS
  Filled 2019-01-28: qty 0.5

## 2019-01-28 MED ORDER — HYDROMORPHONE HCL PF 0.5 MG/0.5 ML IJ SOLN *I*
0.5000 mg | INTRAMUSCULAR | Status: DC | PRN
Start: 2019-01-28 — End: 2019-01-29

## 2019-01-28 MED ORDER — ONDANSETRON HCL 2 MG/ML IV SOLN *I*
4.0000 mg | Freq: Once | INTRAMUSCULAR | Status: AC
Start: 2019-01-28 — End: 2019-01-28
  Administered 2019-01-28: 4 mg via INTRAVENOUS
  Filled 2019-01-28: qty 2

## 2019-01-28 MED ORDER — SODIUM CHLORIDE 0.9 % IV BOLUS *I*
1000.0000 mL | Freq: Once | Status: AC
Start: 2019-01-28 — End: 2019-01-28
  Administered 2019-01-28: 1000 mL via INTRAVENOUS

## 2019-01-28 MED ORDER — CALCIUM CARBONATE ANTACID 500 MG PO CHEW *I*
1000.0000 mg | CHEWABLE_TABLET | Freq: Three times a day (TID) | ORAL | Status: DC | PRN
Start: 2019-01-28 — End: 2019-01-29
  Filled 2019-01-28 (×2): qty 2

## 2019-01-28 MED ORDER — PANTOPRAZOLE SODIUM 40 MG IV SOLR *I*
40.0000 mg | Freq: Once | INTRAVENOUS | Status: AC
Start: 2019-01-28 — End: 2019-01-28
  Administered 2019-01-28: 40 mg via INTRAVENOUS
  Filled 2019-01-28: qty 10

## 2019-01-28 MED ORDER — SODIUM CHLORIDE 0.9 % 100 ML IV SOLN *I*
12.5000 mg | Freq: Once | INTRAVENOUS | Status: AC
Start: 2019-01-28 — End: 2019-01-28
  Administered 2019-01-28: 12.5 mg via INTRAVENOUS
  Filled 2019-01-28: qty 1

## 2019-01-28 MED ORDER — ONDANSETRON HCL 2 MG/ML IV SOLN *I*
4.0000 mg | Freq: Four times a day (QID) | INTRAMUSCULAR | Status: DC | PRN
Start: 2019-01-28 — End: 2019-01-29
  Administered 2019-01-29 (×3): 4 mg via INTRAVENOUS
  Filled 2019-01-28 (×3): qty 2

## 2019-01-28 MED ORDER — ACETAMINOPHEN 325 MG PO TABS *I*
650.0000 mg | ORAL_TABLET | ORAL | Status: DC | PRN
Start: 2019-01-28 — End: 2019-01-29
  Administered 2019-01-29 (×2): 650 mg via ORAL
  Filled 2019-01-28 (×2): qty 2

## 2019-01-28 MED ORDER — HYDROMORPHONE HCL PF 1 MG/ML IJ SOLN *WRAPPED*
1.0000 mg | INTRAMUSCULAR | Status: DC | PRN
Start: 2019-01-28 — End: 2019-01-29
  Administered 2019-01-28 – 2019-01-29 (×7): 1 mg via INTRAVENOUS
  Filled 2019-01-28 (×7): qty 1

## 2019-01-28 MED ORDER — MORPHINE SULFATE 4 MG/ML IV SOLN *WRAPPED*
4.0000 mg | Freq: Once | INTRAVENOUS | Status: AC
Start: 2019-01-28 — End: 2019-01-28
  Administered 2019-01-28: 4 mg via INTRAVENOUS
  Filled 2019-01-28: qty 1

## 2019-01-28 MED ORDER — DEXTROSE 5 % FLUSH FOR PUMPS *I*
0.0000 mL/h | INTRAVENOUS | Status: DC | PRN
Start: 2019-01-28 — End: 2019-01-29

## 2019-01-28 MED ORDER — HYDROMORPHONE HCL PF 0.5 MG/0.5 ML IJ SOLN *I*
0.2500 mg | INTRAMUSCULAR | Status: DC | PRN
Start: 2019-01-28 — End: 2019-01-28
  Administered 2019-01-28 (×2): 0.25 mg via INTRAVENOUS
  Filled 2019-01-28 (×2): qty 0.5

## 2019-01-28 MED ORDER — SODIUM CHLORIDE 0.9 % IV SOLN WRAPPED *I*
150.0000 mL/h | Status: DC
Start: 2019-01-28 — End: 2019-01-29
  Administered 2019-01-28 (×2): 150 mL/h via INTRAVENOUS
  Administered 2019-01-28 (×2): 200 mL/h via INTRAVENOUS
  Administered 2019-01-29 (×4): 150 mL/h via INTRAVENOUS

## 2019-01-28 MED ORDER — SODIUM CHLORIDE 0.9 % FLUSH FOR PUMPS *I*
0.0000 mL/h | INTRAVENOUS | Status: DC | PRN
Start: 2019-01-28 — End: 2019-01-29

## 2019-01-28 MED ORDER — HYDROMORPHONE HCL PF 0.5 MG/0.5 ML IJ SOLN *I*
0.5000 mg | INTRAMUSCULAR | Status: DC | PRN
Start: 2019-01-28 — End: 2019-01-28

## 2019-01-28 MED ORDER — FERROUS SULFATE 325 (65 FE) MG PO TABS *WRAPPED* *I*
325.0000 mg | ORAL_TABLET | Freq: Three times a day (TID) | ORAL | Status: DC
Start: 2019-01-28 — End: 2019-01-29
  Administered 2019-01-28 – 2019-01-29 (×3): 325 mg via ORAL
  Filled 2019-01-28 (×5): qty 1

## 2019-01-28 MED ORDER — ACETAMINOPHEN 650 MG RE SUPP *I*
325.0000 mg | RECTAL | Status: DC | PRN
Start: 2019-01-28 — End: 2019-01-29
  Filled 2019-01-28: qty 1

## 2019-01-28 MED ORDER — IOHEXOL 350 MG/ML (OMNIPAQUE) IV SOLN *I*
1.0000 mL | Freq: Once | INTRAVENOUS | Status: AC
Start: 2019-01-28 — End: 2019-01-28
  Administered 2019-01-28: 137 mL via INTRAVENOUS

## 2019-01-28 NOTE — Provider Consult (Addendum)
Gastroenterology Consult    Admit Date:  01/28/2019  Attending Provider:  Glennon Mac, MD                                  Primary Care Physician:  Durward Parcel (Inactive)   Admitting Diagnosis: Hematemesis  Consult reason: Hematemesis    Subjective:      Chart reviewed. History obtained from patient and chart review.    Annette Preston is a 33 y.o. year old female with PMH significant for hx of previous GI bleed, s/p previous abdominal surgeries include chole and C-sections.  Reports abdominal pain and hematemeses began at 1 AM this morning. Was feeling lightheaded, dizzy and SOB, reports low grade fever, chills.  Denies any cp, denies any rectal bleeding.  Last EGD on 11/05/18 by Dr. Ronalee Red, was neg.  Does admit to eating prior to coming into ED  HCT 32, WBC 12.3, BUN 10, LFT WNL.    GI consult for hematemesis.  Pt seen this afternoon.  Reports she is c/o of left sided abdominal pain, "like giving birth pains" with some radiation to back, and had vomiting of blood (took photos on her phone), feeling light headed, dizzy, SOB. States she had upper GI bleed in 11/19 but denies this abdominal pain, also feeling bloated, reports she has been passing gas.  Denies any coffee ground emesis, denies any NSAID use, no tobacco or ETOH use. Had formed BM this AM, reports brown, denies any black stools or bloody stools, no current diarrhea.  Last used marijuana was 3 mos ago, denies any other drug use.  Denies any recent family illness.  Note ED note from [facility]/03/19 for abdominal pain, nausea, vomiting and diarrhea, w/ previous visits for same symptoms.    Medications:   Home Medications:  Prior to Admission medications    Medication Sig Start Date End Date Taking? Authorizing Provider   pantoprazole (PROTONIX) 20 MG EC tablet Take 1 tablet (20 mg total) by mouth daily Swallow whole. Do not crush, break, or chew. 12/20/18 01/19/19  Wendee Beavers, MD   ondansetron (ZOFRAN) 4 MG tablet Take 1 tablet (4 mg total) by mouth  3 times daily as needed 12/20/18   Wendee Beavers, MD   ondansetron (ZOFRAN-ODT) 4 MG disintegrating tablet Take 1 tablet (4 mg total) by mouth 3 times daily as needed Place on top of tongue. 11/07/18   Jonna Munro, MD   IRON CR PO Take by mouth 2 times daily    [provider]     Hospital Medications:     sodium chloride 200 mL/hr (01/28/19 1231)     Family history:     Family History   Problem Relation Age of Onset    Colon cancer Paternal Grandmother      PMH:     Past Medical History:   Diagnosis Date    Anemia     Depression     GI bleed     hemetesis      Past Surgical History:   Procedure Laterality Date    CESAREAN SECTION, UNSPECIFIED      CHOLECYSTECTOMY      GALLBLADDER SURGERY       Social history:     Social History     Tobacco Use    Smoking status: Never Smoker    Smokeless tobacco: Never Used   Substance Use Topics    Alcohol use:  Not Currently     Frequency: Never     Allergies:   No Known Allergies (drug, envir, food or latex)  Review of Systems:   As per HPI  PHYSICAL EXAM:  Temp Readings from Last 3 Encounters:   01/28/19 36.2 C (97.2 F)   12/21/18 36.5 C (97.7 F) (Temporal)   11/07/18 36.7 C (98.1 F)     BP Readings from Last 3 Encounters:   01/28/19 (!) 134/91   12/21/18 110/60   11/07/18 133/86     Pulse Readings from Last 3 Encounters:   01/28/19 (!) 114   12/21/18 68   11/07/18 91         Patient Vitals for the past 72 hrs:   BP Temp Pulse Resp SpO2 Height Weight   01/28/19 1002 (!) 134/91 36.2 C (97.2 F) (!) 114 18 100 % -- --   01/28/19 1001 -- -- -- -- -- 152.4 cm (5') 92.1 kg (203 lb)     Wt Readings from Last 3 Encounters:   01/28/19 92.1 kg (203 lb)   01/28/19 92.1 kg (203 lb)   12/20/18 95.3 kg (210 lb)     No intake/output data recorded.  General appearance: alert, appears stated age and cooperative  Head: normocephalic, without obvious abnormality, atraumatic  Eyes: conjunctivae/corneas clear.   Neck: supple, symmetrical, trachea  midline  Lungs: clear to auscultation bilaterally  Heart: regular rate and rhythm, S1, S2 normal, no murmur, click, rub or gallop  Abdomen: Mildly distended, soft, diffuse tenderness with periumbilical and Left quadrant pain, some discomfort to back; +bowel sounds normal; no guarding or rebound pain noted.   Extremities: atraumatic, no cyanosis or edema  Neurologic: grossly normal    LAB DATA:     Lab Results:    Recent Labs   Lab 01/28/19  1229   WBC 12.3*   Hemoglobin 9.4*   Hematocrit 32*   Platelets 532*     No components found with this basename:  MCV           Recent Labs   Lab 01/28/19  1229   Sodium 138   Potassium 4.8   Chloride 100   CO2 25   UN 10   Creatinine 0.66   Glucose 102*   Calcium 9.5            Lab results: 01/28/19  1229 12/20/18  1910 11/07/18  1708 11/04/18  1818   Total Protein 8.0* 7.9* 7.1 7.7   Albumin 4.5 4.7 4.2 4.8   ALT _0 AST _1 Alk Phos 74 64 53 58   Bilirubin,Total <0.2 <0.2 <0.2 0.3   Bilirubin,Direct <0.2 <0.2 <0.2 <0.2        Recent Labs   Lab 01/28/19  1229   INR 1.0       Lab Results   Component Value Date    LIP 14 01/28/2019     Recent Labs   Lab 01/28/19  1229   Amylase 42        No results for input(s): CRP in the last 168 hours.    No components found with this basename: SEDRATE   IMAGING:   Ct Abdomen And Pelvis Without And With Contrast    Result Date: 01/28/2019  1. There are no acute abnormalities. 2. There is a multilobulated cystic lesion along the left ovary measuring up to 4.0 x 4.3 cm. There is a 2.5 x 2.4  cm cystic lesion inferiorly. These findings may represent an ovarian neoplasm. Recommend gynecological consultation for further evaluation. END OF IMPRESSION [facility] submits this DICOM format image data and final report to the [facility], an independent secure electronic health information exchange, on a reciprocally searchable basis (with patient authorization) for a minimum of 12 months after exam date.      ASSESSMENT:     33 y.o.  year old female with abdominal pain, hematemesis, low grade fever.  Concern for gastritis vs ulcer, although pt denies any NSAIDS, ETOH or tobacco use, no recent drug use (marijuana).  Ct scan was neg for bowel obstruction, it was noted multilobulated cystic lesion along the left ovary measuring. It was reported these findings may represent an ovarian neoplasm. Recommend gynecological consultation for further evaluation    PLAN:     NPO   IVF  Continue to monitor serial HCT  Protonix IV 40 mg BID  Did discuss plan with Dr. Milinda Pointer and communicated plan to Dr. Orvil Feil    Author: Otila Back, PA-C    I saw and evaluated the patient. I agree with the resident's/mid-level provider findings and plan of care as documented above. Past Medical History, Family History, Social History, ROS, assessment and recommendations except as noted below. I have personally interviewed and examined this patient, was present and personally participated in the key elements of this evaluation, reviewed all pertinent lab and imaging date and studies, and I verify the essential points of this consultation.    33 y/o female with hx of recurrent nausea, vomiting with blood and left-sided abd pain. Hx of anemia for years, on Iron  - HCT 32. CT suggests left ovarian cysts.  On exam her abd is soft but tender in LLQ.  No rebound or guarding noted. Afebrile  Rec - NPO after MN for EGD in am .  Continue protonix.

## 2019-01-28 NOTE — ED Notes (Signed)
01/28/19 1558   Observation Care   Observation care initiated  Yes   Patient has been verbally notified of their observation status Yes   Report Recieved From ED Nurse Yes   Unit Based Transportation Requested Yes

## 2019-01-28 NOTE — Progress Notes (Addendum)
CHW attempted to meet with patient but provider at bedside, Probation officer will attempt again later.    [place] Worker  Pager: [phone]  01/28/2019 2:47 PM      CHW chart reviewed and noticed patient has frequent ED visits and does not have an active PCP. Writer spoke with patient and they state they do have a PCP but she hasn't set up an appointment yet because she just received her insurance and who she was assigned to last week. Writer asked if patient planning on establishing care and if they need assistance, patient states she is going to establish care herself but has been running and just found out who her provider was. Patient declined needing assistance with scheduling an appointment.    [place] Worker  Pager: [phone]  01/28/2019 5:25 PM

## 2019-01-28 NOTE — ED Notes (Signed)
Pt to cat scan

## 2019-01-28 NOTE — Progress Notes (Signed)
Pt still with abdominal pain and has had two episodes of very scant hematemesis - less than a teaspoon of BRB noted on tissue for most-recent episode. On exam - pt resting in bed watching TV in NAD with husband at bedside. +pale. Abdomen soft with reproducible LLQ tenderness and diminished BS. No G/R/masses at present appreciated. Pt alert and conversant. UA returned and grossly normal.     Continue plan as outlined in my earlier note. Will increase Dilaudid from 0.25 mg IV q2h prn to Dilaudid 0.5 mg IV q2h prn. Team may increase it further based on pt need to facilitate adequate pain relief overnight.      Kinnie Scales, [place]  01/28/19 2142

## 2019-01-28 NOTE — ED Obs Notes (Signed)
ED OBSERVATION ADMISSION NOTE    Patient seen by me today, 01/28/2019 at 4:05 PM  Current patient status: Observation    History     Chief Complaint   Patient presents with    Abdominal Pain    Emesis     HPI  This is a 33 y.o. Hispanic female with PMH including stated GIB in 10/2018 (negative workup including EGD at that time) and menorrhagia with associated anemia on TID iron supplements since age 35 who is being admitted for left-sided abdominal pain with several episodes of hematemesis since 1a.     Pt ate macaroni and cheese last night (normal meal for her) and went to bed as usual. Was awoken by severe 10/10 left lower quadrant and epigastric pain, followed by several episodes of hematemesis. She also had a temperature of 100.3, chills, lightheadedness, and palpitations which worsened with vomiting.     +flatus. No recent ingestion of undercooked meat/raw seafood/contaminated water, no travel outside of the [place], no abdominal trauma, no NSAID or ETOH use, no change in diet, no blood in stool, LMP 01/15/19, stool guaiac negative, H/H at baseline, XR benign; CT imaging significant for two left-sided ovarian cysts of unknown etiology.      Pt states that she's used her mother's Tramadol without effect for pain in the past; however, she repeats that her abdominal pain has only started today at 1a.     Past Medical History:   Diagnosis Date    Anemia     Depression     GI bleed     hemetesis       Past Surgical History:   Procedure Laterality Date    CESAREAN SECTION, UNSPECIFIED      CHOLECYSTECTOMY      GALLBLADDER SURGERY         Family History   Problem Relation Age of Onset    Colon cancer Paternal Grandmother        Social History      reports that she has never smoked. She has never used smokeless tobacco. She reports previous alcohol use. She reports that she does not use drugs. No history on file for sexual activity.    Living Situation     Questions Responses    Patient lives with [facility]      Caregiver for other family member     External Services     Employment Employed    Domestic Violence Risk No          Review of Systems   Review of Systems   Constitutional: Positive for chills and fever. Negative for diaphoresis and unexpected weight change.   HENT: Negative for ear discharge, nosebleeds, sinus pressure, sinus pain and trouble swallowing.         Blurred vision at baseline, has not had eyes checked for corrective lenses   Eyes: Negative for pain and discharge.   Respiratory: Negative for cough, chest tightness, shortness of breath and wheezing.    Cardiovascular: Positive for palpitations. Negative for chest pain and leg swelling.   Gastrointestinal: Positive for abdominal pain and vomiting. Negative for abdominal distention, anal bleeding, blood in stool, constipation and diarrhea.   Genitourinary: Positive for flank pain. Negative for hematuria.   Musculoskeletal: Negative for back pain, myalgias and neck pain.   Skin: Negative for rash.   Neurological: Positive for light-headedness. Negative for dizziness, syncope, numbness and headaches.   Hematological: Does not bruise/bleed easily.  Physical Exam   BP 126/84    Pulse 107    Temp 36.2 C (97.2 F) (Temporal)    Resp 18    Ht 1.524 m (5')    Wt 92.1 kg (203 lb)    SpO2 100%    BMI 39.65 kg/m     Physical Exam  Constitutional:       General: She is not in acute distress.     Appearance: She is well-developed. She is obese. She is not ill-appearing, toxic-appearing or diaphoretic.      Comments: Sitting in hall bed with boyfriend at bedside, appears relaxed and in NAD   HENT:      Head: Normocephalic and atraumatic.   Eyes:      General: No scleral icterus.        Right eye: No discharge.         Left eye: No discharge.      Conjunctiva/sclera: Conjunctivae normal.      Comments: Conjunctivae slightly pale   Neck:      Musculoskeletal: Neck supple.      Vascular: No JVD.   Cardiovascular:      Rate and Rhythm: Regular rhythm. Tachycardia  present.      Heart sounds: No murmur. No friction rub. No gallop.    Pulmonary:      Effort: Pulmonary effort is normal. No respiratory distress.      Breath sounds: Normal breath sounds. No stridor. No wheezing or rales.   Chest:      Chest wall: No tenderness.   Abdominal:      General: Bowel sounds are decreased. There is no distension.      Palpations: Abdomen is soft. There is no mass.      Tenderness: There is abdominal tenderness in the epigastric area, left upper quadrant and left lower quadrant. There is no guarding or rebound. Negative signs include Murphy's sign and Rovsing's sign.      Comments: Voluntary guarding   Musculoskeletal:         General: No tenderness or deformity.   Lymphadenopathy:      Cervical: No cervical adenopathy.   Skin:     General: Skin is warm and dry.      Coloration: Skin is not pale.      Findings: No rash.   Neurological:      Mental Status: She is alert.      Motor: No abnormal muscle tone.   Psychiatric:         Mood and Affect: Mood normal.         Behavior: Behavior normal.         Tests    PIR:JJOAC tachycardia at 100 bpm, TWI in V2 and flattened T-wave in V3, QTc 461, no ST-segment abnormalities    Labs:   All labs in the last 24 hours:   Recent Results (from the past 24 hour(s))   CBC and differential    Collection Time: 01/28/19 12:29 PM   Result Value Ref Range    WBC 12.3 (H) 4.0 - 10.0 THOU/uL    RBC 3.9 3.9 - 5.2 MIL/uL    Hemoglobin 9.4 (L) 11.2 - 15.7 g/dL    Hematocrit 32 (L) 34 - 45 %    MCV 82 79 - 95 fL    MCH 24 (L) 26 - 32 pg    MCHC 30 (L) 32 - 36 g/dL    RDW 17.4 (H) 11.7 - 14.4 %  Platelets 532 (H) 160 - 370 THOU/uL    Seg Neut % 80.7 %    Lymphocyte % 13.5 %    Monocyte % 4.9 %    Eosinophil % 0.2 %    Basophil % 0.2 %    Neut # K/uL 9.9 (H) 1.6 - 6.1 THOU/uL    Lymph # K/uL 1.7 1.2 - 3.7 THOU/uL    Mono # K/uL 0.6 0.2 - 0.9 THOU/uL    Eos # K/uL 0.0 0.0 - 0.4 THOU/uL    Baso # K/uL 0.0 0.0 - 0.1 THOU/uL    Nucl RBC % 0.0 0.0 - 0.2 /100 WBC    Nucl  RBC # K/uL 0.0 0.0 - 0.0 THOU/uL    IMM Granulocytes # 0.1 THOU/uL    IMM Granulocytes 0.5 %   Protime-INR    Collection Time: 01/28/19 12:29 PM   Result Value Ref Range    Protime 12.1 10.0 - 12.9 sec    INR 1.0 0.9 - 1.1   APTT    Collection Time: 01/28/19 12:29 PM   Result Value Ref Range    aPTT 34.6 25.8 - 37.9 sec   Basic metabolic panel    Collection Time: 01/28/19 12:29 PM   Result Value Ref Range    Glucose 102 (H) 60 - 99 mg/dL    Sodium 138 133 - 145 mmol/L    Potassium 4.8 3.3 - 5.1 mmol/L    Chloride 100 96 - 108 mmol/L    CO2 25 20 - 28 mmol/L    Anion Gap 13 7 - 16    UN 10 6 - 20 mg/dL    Creatinine 0.66 0.51 - 0.95 mg/dL    GFR,Caucasian 116 *    GFR,Black 134 *    Calcium 9.5 8.8 - 10.2 mg/dL   RUQ panel (ED only)    Collection Time: 01/28/19 12:29 PM   Result Value Ref Range    Amylase 42 28 - 100 U/L    Lipase 14 13 - 60 U/L    Total Protein 8.0 (H) 6.3 - 7.7 g/dL    Albumin 4.5 3.5 - 5.2 g/dL    Globulin 3.5 2.7 - 4.3 g/dL    Bilirubin,Total <0.2 0.0 - 1.2 mg/dL    Bilirubin,Direct <0.2 0.0 - 0.3 mg/dL    Alk Phos 74 35 - 105 U/L    AST 14 0 - 35 U/L    ALT 21 0 - 35 U/L   Type and screen    Collection Time: 01/28/19 12:29 PM   Result Value Ref Range    ABO RH Blood Type A RH POS     Antibody Screen Negative    Pregnancy Test, Serum    Collection Time: 01/28/19 12:29 PM   Result Value Ref Range    Preg,Serum NEG NEGATIVE   Reticulocytes    Collection Time: 01/28/19 12:29 PM   Result Value Ref Range    Retic % 1.9 0.7 - 2.1 %    Retic Abs 73.5 29.9 - 90.9 THOU/uL   RBC morphology    Collection Time: 01/28/19 12:29 PM   Result Value Ref Range    RBC Morphology Normal         Imaging:  *Chest standard frontal and lateral views   Final Result      No acute disease in the chest.      END OF IMPRESSION      [facility] submits this DICOM format image data  and final report to the [facility], an independent secure electronic health information exchange, on a reciprocally searchable basis (with  patient authorization) for a minimum of 12 months after exam    date.      Abdomen AP with decubitus and/or erect   Final Result      Nonspecific bowel gas pattern without evidence for obstruction.      END OF IMPRESSION      [facility] submits this DICOM format image data and final report to the [facility], an independent secure electronic health information exchange, on a reciprocally searchable basis (with patient authorization) for a minimum of 12 months after exam    date.      CT abdomen and pelvis without and with contrast   Final Result      1. There are no acute abnormalities.      2. There is a multilobulated cystic lesion along the left ovary measuring up to 4.0 x 4.3 cm. There is a 2.5 x 2.4 cm cystic lesion inferiorly. These findings may represent an ovarian neoplasm. Recommend gynecological consultation for further evaluation.      END OF IMPRESSION      [facility] submits this DICOM format image data and final report to the [facility], an independent secure electronic health information exchange, on a reciprocally searchable basis (with patient authorization) for a minimum of 12 months after exam    date.            Medical Decision Making        Assessment/Plan:    33 y.o., female placed in OBS after evaluation in the ED for left-sided/epigastric abdominal pain with hematemesis and stable H/H. Last episode of (scant) hematemesis 1.5 hours ago; abdominal pain ongoing. Imaging significant for two cystic lesions on left ovary; otherwise grossly benign. Past EGD 10/2018 negative.    - NPO on IVF overnight   - Repeat a.m. CBC or sooner if pt actively/significantly re-bleeds in interim   - Follow-up GI's ongoing recommendations; plan for EGD in a.m.   - Avoid all NSAIDs and anticoagulants   - Pain management regimen; APAP prn fever  Ovarian lesions - Tender on exam - ?ruptured ovarian cyst.    - Monitor clinically   - Consider in-house gynecology evaluation pending course   - Pt needs eventual  outpt management and further evaluation of lesions, as fully discussed with pt's husband and pt herself. Pt has not seen an ob/gyn since the birth of her youngest child 5 years ago  Menorrhagia - On FeSO4 TID; continue  DVT ppx - Ambulation  Code status - Full code  [facility] tomorrow pending above      Discussed with pt and with Dr. Richardson Landry, who are both in agreement with plan of care as outlined above.   Kinnie Scales, [place]     Howie Ill Nikolski, [place]  01/28/19 [phone]

## 2019-01-28 NOTE — ED Notes (Signed)
Pt to ED with abdominal pain, nausea, and vomiting since around 1am today. Pt states that she has vomited blood 8 times. Pt reports this has happened to her in the past. Pt denies diarrhea. PT reports that she has been dizzy as well. Pt has not vomited since arriving in ED.

## 2019-01-28 NOTE — H&P (View-Only) (Signed)
Gastroenterology Consult    Admit Date:  01/28/2019  Attending Provider:  Whelan, Mervyn, MD                                  Primary Care Physician:  Cooley, Brenda (Inactive)   Admitting Diagnosis: Hematemesis  Consult reason: Hematemesis    Subjective:      Chart reviewed. History obtained from patient and chart review.    Annette Preston is a 33 y.o. year old female with PMH significant for hx of previous GI bleed, s/p previous abdominal surgeries include chole and C-sections.  Reports abdominal pain and hematemeses began at 1 AM this morning. Was feeling lightheaded, dizzy and SOB, reports low grade fever, chills.  Denies any cp, denies any rectal bleeding.  Last EGD on 11/05/18 by Dr. Huang, was neg.  Does admit to eating prior to coming into ED  HCT 32, WBC 12.3, BUN 10, LFT WNL.    GI consult for hematemesis.  Pt seen this afternoon.  Reports she is c/o of left sided abdominal pain, "like giving birth pains" with some radiation to back, and had vomiting of blood (took photos on her phone), feeling light headed, dizzy, SOB. States she had upper GI bleed in 11/19 but denies this abdominal pain, also feeling bloated, reports she has been passing gas.  Denies any coffee ground emesis, denies any NSAID use, no tobacco or ETOH use. Had formed BM this AM, reports brown, denies any black stools or bloody stools, no current diarrhea.  Last used marijuana was 3 mos ago, denies any other drug use.  Denies any recent family illness.  Note ED note from [facility] 01/03/19 for abdominal pain, nausea, vomiting and diarrhea, w/ previous visits for same symptoms.    Medications:   Home Medications:  Prior to Admission medications    Medication Sig Start Date End Date Taking? Authorizing Provider   pantoprazole (PROTONIX) 20 MG EC tablet Take 1 tablet (20 mg total) by mouth daily Swallow whole. Do not crush, break, or chew. 12/20/18 01/19/19  Kadow, Elizabeth Ann, MD   ondansetron (ZOFRAN) 4 MG tablet Take 1 tablet (4 mg total) by mouth  3 times daily as needed 12/20/18   Kadow, Elizabeth Ann, MD   ondansetron (ZOFRAN-ODT) 4 MG disintegrating tablet Take 1 tablet (4 mg total) by mouth 3 times daily as needed Place on top of tongue. 11/07/18   Kapilevich, Elena, MD   IRON CR PO Take by mouth 2 times daily    [provider]     Hospital Medications:    • sodium chloride 200 mL/hr (01/28/19 1231)     Family history:     Family History   Problem Relation Age of Onset   • Colon cancer Paternal Grandmother      PMH:     Past Medical History:   Diagnosis Date   • Anemia    • Depression    • GI bleed     hemetesis      Past Surgical History:   Procedure Laterality Date   • CESAREAN SECTION, UNSPECIFIED     • CHOLECYSTECTOMY     • GALLBLADDER SURGERY       Social history:     Social History     Tobacco Use   • Smoking status: Never Smoker   • Smokeless tobacco: Never Used   Substance Use Topics   • Alcohol use:   Not Currently     Frequency: Never     Allergies:   No Known Allergies (drug, envir, food or latex)  Review of Systems:   As per HPI  PHYSICAL EXAM:  Temp Readings from Last 3 Encounters:   01/28/19 36.2 °C (97.2 °F)   12/21/18 36.5 °C (97.7 °F) (Temporal)   11/07/18 36.7 °C (98.1 °F)     BP Readings from Last 3 Encounters:   01/28/19 (!) 134/91   12/21/18 110/60   11/07/18 133/86     Pulse Readings from Last 3 Encounters:   01/28/19 (!) 114   12/21/18 68   11/07/18 91         Patient Vitals for the past 72 hrs:   BP Temp Pulse Resp SpO2 Height Weight   01/28/19 1002 (!) 134/91 36.2 °C (97.2 °F) (!) 114 18 100 % -- --   01/28/19 1001 -- -- -- -- -- 152.4 cm (5') 92.1 kg (203 lb)     Wt Readings from Last 3 Encounters:   01/28/19 92.1 kg (203 lb)   01/28/19 92.1 kg (203 lb)   12/20/18 95.3 kg (210 lb)     No intake/output data recorded.  General appearance: alert, appears stated age and cooperative  Head: normocephalic, without obvious abnormality, atraumatic  Eyes: conjunctivae/corneas clear.   Neck: supple, symmetrical, trachea  midline  Lungs: clear to auscultation bilaterally  Heart: regular rate and rhythm, S1, S2 normal, no murmur, click, rub or gallop  Abdomen: Mildly distended, soft, diffuse tenderness with periumbilical and Left quadrant pain, some discomfort to back; +bowel sounds normal; no guarding or rebound pain noted.   Extremities: atraumatic, no cyanosis or edema  Neurologic: grossly normal    LAB DATA:     Lab Results:    Recent Labs   Lab 01/28/19  1229   WBC 12.3*   Hemoglobin 9.4*   Hematocrit 32*   Platelets 532*     No components found with this basename:  MCV           Recent Labs   Lab 01/28/19  1229   Sodium 138   Potassium 4.8   Chloride 100   CO2 25   UN 10   Creatinine 0.66   Glucose 102*   Calcium 9.5            Lab results: 01/28/19  1229 12/20/18  1910 11/07/18  1708 11/04/18  1818   Total Protein 8.0* 7.9* 7.1 7.7   Albumin 4.5 4.7 4.2 4.8   ALT 21 20 9 15   AST 14 24 11 15   Alk Phos 74 64 53 58   Bilirubin,Total <0.2 <0.2 <0.2 0.3   Bilirubin,Direct <0.2 <0.2 <0.2 <0.2        Recent Labs   Lab 01/28/19  1229   INR 1.0       Lab Results   Component Value Date    LIP 14 01/28/2019     Recent Labs   Lab 01/28/19  1229   Amylase 42        No results for input(s): CRP in the last 168 hours.    No components found with this basename: SEDRATE   IMAGING:   Ct Abdomen And Pelvis Without And With Contrast    Result Date: 01/28/2019  1. There are no acute abnormalities. 2. There is a multilobulated cystic lesion along the left ovary measuring up to 4.0 x 4.3 cm. There is a 2.5 x 2.4   cm cystic lesion inferiorly. These findings may represent an ovarian neoplasm. Recommend gynecological consultation for further evaluation. END OF IMPRESSION [facility] submits this DICOM format image data and final report to the [facility], an independent secure electronic health information exchange, on a reciprocally searchable basis (with patient authorization) for a minimum of 12 months after exam date.      ASSESSMENT:     33 y.o.  year old female with abdominal pain, hematemesis, low grade fever.  Concern for gastritis vs ulcer, although pt denies any NSAIDS, ETOH or tobacco use, no recent drug use (marijuana).  Ct scan was neg for bowel obstruction, it was noted multilobulated cystic lesion along the left ovary measuring. It was reported these findings may represent an ovarian neoplasm. Recommend gynecological consultation for further evaluation    PLAN:     NPO   IVF  Continue to monitor serial HCT  Protonix IV 40 mg BID  Did discuss plan with Dr. Cloy Cozzens and communicated plan to Dr. Whelan    Author: Ann Chateauneuf, PA-C    I saw and evaluated the patient. I agree with the resident's/mid-level provider findings and plan of care as documented above. Past Medical History, Family History, Social History, ROS, assessment and recommendations except as noted below. I have personally interviewed and examined this patient, was present and personally participated in the key elements of this evaluation, reviewed all pertinent lab and imaging date and studies, and I verify the essential points of this consultation.    33 y/o female with hx of recurrent nausea, vomiting with blood and left-sided abd pain. Hx of anemia for years, on Iron  - HCT 32. CT suggests left ovarian cysts.  On exam her abd is soft but tender in LLQ.  No rebound or guarding noted. Afebrile  Rec - NPO after MN for EGD in am .  Continue protonix.

## 2019-01-28 NOTE — ED Triage Notes (Signed)
Pt c/o abd pain, bloody emesis since last night.       Triage Note   Louie Bun, RN

## 2019-01-28 NOTE — ED Provider Notes (Signed)
History     Chief Complaint   Patient presents with    Abdominal Pain    Emesis     Patient started vomiting with upper and mid abdominal pain with blood in the vomitus. This occurred eight times since 1 am. She has pain mid and lower left abdomen. She had a bleeding episode 5 months ago and was not able to find the cause of the bleeding. She has had a normal BM today without blood. At three am she had a fever of 100.          Medical/Surgical/Family History     Past Medical History:   Diagnosis Date    Anemia     Depression     GI bleed     hemetesis        Patient Active Problem List   Diagnosis Code    Anemia D64.9            Past Surgical History:   Procedure Laterality Date    CESAREAN SECTION, UNSPECIFIED      CHOLECYSTECTOMY      GALLBLADDER SURGERY       Family History   Problem Relation Age of Onset    Colon cancer Paternal Grandmother           Social History     Tobacco Use    Smoking status: Never Smoker    Smokeless tobacco: Never Used   Substance Use Topics    Alcohol use: Not Currently     Frequency: Never    Drug use: Never     Living Situation     Questions Responses    Patient lives with Family    Homeless     Caregiver for other family member     External Services     Employment Employed    Domestic Violence Risk No                Review of Systems   Review of Systems   Constitutional: Positive for activity change.   HENT: Negative.    Eyes: Negative.    Respiratory: Negative.    Gastrointestinal: Positive for abdominal pain, blood in stool, nausea and vomiting.   Endocrine: Negative.    Genitourinary: Negative.    Musculoskeletal: Negative.    Skin: Negative.    Allergic/Immunologic: Negative.    Neurological: Negative.    Psychiatric/Behavioral: Negative.        Physical Exam     Triage Vitals  Triage Start: Start, (01/28/19 1000)   First Recorded BP: (!) 134/91, Resp: 18, Temp: 36.2 C (97.2 F) Oxygen Therapy SpO2: 100 %, Heart Rate: (!) 114, (01/28/19 1002)  .  First Pain  Reported  0-10 Scale: 10, (01/28/19 1001)       Physical Exam  Vitals signs and nursing note reviewed.   Constitutional:       General: She is not in acute distress.     Appearance: She is well-developed. She is not diaphoretic.   HENT:      Head: Normocephalic and atraumatic.      Right Ear: Tympanic membrane, ear canal and external ear normal.      Left Ear: Tympanic membrane, ear canal and external ear normal.      Nose: Nose normal.      Mouth/Throat:      Pharynx: No oropharyngeal exudate.   Eyes:      General: No scleral icterus.        Right eye:  No discharge.         Left eye: No discharge.      Conjunctiva/sclera: Conjunctivae normal.      Pupils: Pupils are equal, round, and reactive to light.   Neck:      Musculoskeletal: Normal range of motion and neck supple.      Thyroid: No thyromegaly.      Vascular: No JVD.      Trachea: No tracheal deviation.   Cardiovascular:      Rate and Rhythm: Normal rate and regular rhythm.      Heart sounds: Normal heart sounds.   Pulmonary:      Effort: Pulmonary effort is normal.      Breath sounds: Normal breath sounds. No stridor.   Abdominal:      General: There is no distension.      Palpations: Abdomen is soft. There is no mass.      Tenderness: There is abdominal tenderness in the periumbilical area, left upper quadrant and left lower quadrant. There is no guarding or rebound.   Musculoskeletal: Normal range of motion.         General: No tenderness or deformity.   Lymphadenopathy:      Cervical: No cervical adenopathy.   Skin:     General: Skin is warm and dry.      Capillary Refill: Capillary refill takes less than 2 seconds.      Coloration: Skin is not pale.      Findings: No erythema or rash.   Neurological:      General: No focal deficit present.      Mental Status: She is alert.      Cranial Nerves: No cranial nerve deficit.   Psychiatric:         Mood and Affect: Mood normal.         Behavior: Behavior normal.         Thought Content: Thought content normal.          Judgment: Judgment normal.         Medical Decision Making     Assessment:  Vomiting bloody material with abdominal pain and fever    Differential diagnosis:  Upper GI bleed v colitis v other pathology     Plan:  Labs imaging reassessment after pain control    EKG Interpretation:  Rate 100, normal sinus rhythm, no ischemic changes    ED Course and Disposition:  Called GI to see. They will see and are aware. Labs stable and CT negative.  ED OBS stay GI plan endoscopy tomorrow            Aceton Kinnear Jerrol Banana, MD          Glennon Mac, MD  01/28/19 [phone]

## 2019-01-28 NOTE — ED Notes (Signed)
Plan of Care     Patient brought to the EOU-38 alert and oriented x 4 with husband via wheelchair. Belongings at side and patient independent, vitals stable and infusion running, pain medication given for pain 10/10. Patient assessed and understands plan of care while here and agrees.

## 2019-01-29 ENCOUNTER — Observation Stay: Admit: 2019-01-29 | Discharge: 2019-01-29 | Disposition: A | Discharge status: Home / Self Care | Payer: Medicaid Other

## 2019-01-29 ENCOUNTER — Encounter
Admission: EM | Disposition: A | Discharge status: Home / Self Care | Payer: Self-pay | Source: Ambulatory Visit | Attending: Obstetrics & Gynecology

## 2019-01-29 DIAGNOSIS — R109 Unspecified abdominal pain: Secondary | ICD-10-CM

## 2019-01-29 DIAGNOSIS — E669 Obesity, unspecified: Secondary | ICD-10-CM

## 2019-01-29 DIAGNOSIS — N7093 Salpingitis and oophoritis, unspecified: Secondary | ICD-10-CM

## 2019-01-29 DIAGNOSIS — R1904 Left lower quadrant abdominal swelling, mass and lump: Secondary | ICD-10-CM

## 2019-01-29 HISTORY — PX: PR ESOPHAGOGASTRODUODENOSCOPY TRANSORAL DIAGNOSTIC: 43235

## 2019-01-29 LAB — COMPREHENSIVE METABOLIC PANEL
ALT: 17 U/L (ref 0–35)
AST: 15 U/L (ref 0–35)
Albumin: 3.7 g/dL (ref 3.5–5.2)
Alk Phos: 71 U/L (ref 35–105)
Anion Gap: 10 (ref 7–16)
Bilirubin,Total: 0.5 mg/dL (ref 0.0–1.2)
CO2: 26 mmol/L (ref 20–28)
Calcium: 8.4 mg/dL — ABNORMAL LOW (ref 8.8–10.2)
Chloride: 100 mmol/L (ref 96–108)
Creatinine: 0.82 mg/dL (ref 0.51–0.95)
GFR,Black: 109 *
GFR,Caucasian: 94 *
Globulin: 3 g/dL (ref 2.7–4.3)
Glucose: 98 mg/dL (ref 60–99)
Lab: 5 mg/dL — ABNORMAL LOW (ref 6–20)
Potassium: 4 mmol/L (ref 3.3–5.1)
Sodium: 136 mmol/L (ref 133–145)
Total Protein: 6.7 g/dL (ref 6.3–7.7)

## 2019-01-29 LAB — CBC
Hematocrit: 26 % — ABNORMAL LOW (ref 34–45)
Hemoglobin: 7.6 g/dL — ABNORMAL LOW (ref 11.2–15.7)
MCH: 24 pg — ABNORMAL LOW (ref 26–32)
MCHC: 30 g/dL — ABNORMAL LOW (ref 32–36)
MCV: 82 fL (ref 79–95)
Platelets: 358 10*3/uL (ref 160–370)
RBC: 3.1 MIL/uL — ABNORMAL LOW (ref 3.9–5.2)
RDW: 17.5 % — ABNORMAL HIGH (ref 11.7–14.4)
WBC: 14 10*3/uL — ABNORMAL HIGH (ref 4.0–10.0)

## 2019-01-29 LAB — CBC AND DIFFERENTIAL
Baso # K/uL: 0 10*3/uL (ref 0.0–0.1)
Basophil %: 0.2 %
Eos # K/uL: 0 10*3/uL (ref 0.0–0.4)
Eosinophil %: 0.2 %
Hematocrit: 27 % — ABNORMAL LOW (ref 34–45)
Hemoglobin: 7.9 g/dL — ABNORMAL LOW (ref 11.2–15.7)
IMM Granulocytes #: 0.1 10*3/uL
IMM Granulocytes: 0.5 %
Lymph # K/uL: 1.8 10*3/uL (ref 1.2–3.7)
Lymphocyte %: 14.1 %
MCH: 24 pg — ABNORMAL LOW (ref 26–32)
MCHC: 29 g/dL — ABNORMAL LOW (ref 32–36)
MCV: 82 fL (ref 79–95)
Mono # K/uL: 1 10*3/uL — ABNORMAL HIGH (ref 0.2–0.9)
Monocyte %: 8 %
Neut # K/uL: 9.9 10*3/uL — ABNORMAL HIGH (ref 1.6–6.1)
Nucl RBC # K/uL: 0 10*3/uL (ref 0.0–0.0)
Nucl RBC %: 0 /100 WBC (ref 0.0–0.2)
Platelets: 402 10*3/uL — ABNORMAL HIGH (ref 160–370)
RBC: 3.3 MIL/uL — ABNORMAL LOW (ref 3.9–5.2)
RDW: 17.5 % — ABNORMAL HIGH (ref 11.7–14.4)
Seg Neut %: 77 %
WBC: 12.9 10*3/uL — ABNORMAL HIGH (ref 4.0–10.0)

## 2019-01-29 LAB — URINALYSIS REFLEX TO CULTURE
Blood,UA: NEGATIVE
Leuk Esterase,UA: NEGATIVE
Nitrite,UA: NEGATIVE
Protein,UA: NEGATIVE mg/dL
Specific Gravity,UA: 1.009 (ref 1.001–1.030)
pH,UA: 5 (ref 5.0–8.0)

## 2019-01-29 LAB — LACTATE, VENOUS, WHOLE BLOOD: Lactate VEN,WB: 0.9 mmol/L (ref 0.5–2.2)

## 2019-01-29 LAB — CRP: CRP: 161 mg/L — ABNORMAL HIGH (ref 0–10)

## 2019-01-29 LAB — SEDIMENTATION RATE, AUTOMATED: Sedimentation Rate: 43 mm/hr — ABNORMAL HIGH (ref 0–20)

## 2019-01-29 SURGERY — EGD (ESOPHAGOGASTRODUODENOSCOPY)

## 2019-01-29 MED ORDER — DOXYCYCLINE 100 MG / 110 ML NS *I*
100.0000 mg | Freq: Two times a day (BID) | INTRAVENOUS | Status: DC
Start: 2019-01-30 — End: 2019-02-01
  Administered 2019-01-30 – 2019-02-01 (×5): 100 mg via INTRAVENOUS
  Filled 2019-01-29 (×6): qty 100

## 2019-01-29 MED ORDER — MIDAZOLAM HCL 5 MG/5ML IJ SOLN *I*
INTRAMUSCULAR | Status: AC
Start: 2019-01-29 — End: 2019-01-29
  Filled 2019-01-29: qty 10

## 2019-01-29 MED ORDER — IBUPROFEN 600 MG PO TABS *I*
600.0000 mg | ORAL_TABLET | Freq: Four times a day (QID) | ORAL | Status: DC
Start: 2019-01-29 — End: 2019-01-30
  Administered 2019-01-29 – 2019-01-30 (×3): 600 mg via ORAL
  Filled 2019-01-29 (×4): qty 1

## 2019-01-29 MED ORDER — LACTATED RINGERS IV SOLN *I*
125.0000 mL/h | INTRAVENOUS | Status: DC
Start: 2019-01-29 — End: 2019-02-04
  Administered 2019-01-29 (×3): 125 mL/h via INTRAVENOUS
  Administered 2019-01-30 (×3): 125 mL/h
  Administered 2019-01-30: 125 mL/h via INTRAVENOUS
  Administered 2019-01-30 (×2): 125 mL/h
  Administered 2019-01-30: 125 mL/h via INTRAVENOUS
  Administered 2019-01-30 (×3): 125 mL/h
  Administered 2019-01-30 (×2): 125 mL/h via INTRAVENOUS
  Administered 2019-01-30 – 2019-01-31 (×6): 125 mL/h
  Administered 2019-01-31: 125 mL/h via INTRAVENOUS
  Administered 2019-01-31 (×4): 125 mL/h
  Administered 2019-01-31: 125 mL/h via INTRAVENOUS
  Administered 2019-01-31: 125 mL/h
  Administered 2019-01-31: 125 mL/h via INTRAVENOUS
  Administered 2019-01-31 – 2019-02-01 (×5): 125 mL/h
  Administered 2019-02-01: 125 mL/h via INTRAVENOUS
  Administered 2019-02-01 (×8): 125 mL/h
  Administered 2019-02-01: 125 mL/h via INTRAVENOUS
  Administered 2019-02-01 (×3): 125 mL/h
  Administered 2019-02-01: 125 mL/h via INTRAVENOUS
  Administered 2019-02-01 – 2019-02-02 (×6): 125 mL/h
  Administered 2019-02-02 (×2): 125 mL/h via INTRAVENOUS
  Administered 2019-02-02 (×3): 125 mL/h
  Administered 2019-02-02 – 2019-02-03 (×3): 125 mL/h via INTRAVENOUS

## 2019-01-29 MED ORDER — METRONIDAZOLE IN NACL 5 MG/ML IV SOLUTION *WRAPPED*
500.0000 mg | Freq: Three times a day (TID) | INTRAVENOUS | Status: DC
Start: 2019-01-29 — End: 2019-02-01
  Administered 2019-01-30 – 2019-02-01 (×8): 500 mg via INTRAVENOUS
  Filled 2019-01-29 (×11): qty 100

## 2019-01-29 MED ORDER — ONDANSETRON HCL 4 MG PO TABS *I*
4.0000 mg | ORAL_TABLET | Freq: Three times a day (TID) | ORAL | Status: DC | PRN
Start: 2019-01-29 — End: 2019-02-02
  Administered 2019-01-30 – 2019-02-02 (×3): 4 mg via ORAL
  Filled 2019-01-29 (×4): qty 1

## 2019-01-29 MED ORDER — FENTANYL CITRATE 50 MCG/ML IJ SOLN *WRAPPED*
INTRAMUSCULAR | Status: AC
Start: 2019-01-29 — End: 2019-01-29
  Filled 2019-01-29: qty 2

## 2019-01-29 MED ORDER — FENTANYL CITRATE 50 MCG/ML IJ SOLN *WRAPPED*
INTRAMUSCULAR | Status: AC | PRN
Start: 2019-01-29 — End: 2019-01-29
  Administered 2019-01-29: 50 ug via INTRAVENOUS
  Administered 2019-01-29: 25 ug via INTRAVENOUS

## 2019-01-29 MED ORDER — SODIUM CHLORIDE 0.9 % IV SOLN WRAPPED *I*
30.0000 mL/h | Status: DC
Start: 2019-01-29 — End: 2019-01-29
  Administered 2019-01-29: 30 mL/h via INTRAVENOUS

## 2019-01-29 MED ORDER — ACETAMINOPHEN 325 MG PO TABS *I*
650.0000 mg | ORAL_TABLET | ORAL | Status: DC
Start: 2019-01-29 — End: 2019-02-01
  Administered 2019-01-29 – 2019-02-01 (×13): 650 mg via ORAL
  Filled 2019-01-29 (×14): qty 2

## 2019-01-29 MED ORDER — HYDROMORPHONE HCL PF 0.5 MG/0.5 ML IJ SOLN *I*
0.5000 mg | Freq: Once | INTRAMUSCULAR | Status: AC
Start: 2019-01-29 — End: 2019-01-29
  Administered 2019-01-29: 0.5 mg via INTRAVENOUS
  Filled 2019-01-29: qty 0.5

## 2019-01-29 MED ORDER — BENZOCAINE 20 % MT SOLN WRAPPED *I*
1.0000 | Freq: Once | OROMUCOSAL | Status: AC
Start: 2019-01-29 — End: 2019-01-29
  Administered 2019-01-29: 1 via OROMUCOSAL

## 2019-01-29 MED ORDER — METRONIDAZOLE IN NACL 5 MG/ML IV SOLUTION *WRAPPED*
500.0000 mg | Freq: Three times a day (TID) | INTRAVENOUS | Status: DC
Start: 2019-01-29 — End: 2019-01-29
  Administered 2019-01-29: 500 mg via INTRAVENOUS
  Filled 2019-01-29 (×4): qty 100

## 2019-01-29 MED ORDER — MIDAZOLAM HCL 1 MG/ML IJ SOLN *I* WRAPPED
INTRAMUSCULAR | Status: AC | PRN
Start: 2019-01-29 — End: 2019-01-29
  Administered 2019-01-29: 2 mg via INTRAVENOUS
  Administered 2019-01-29: 1 mg via INTRAVENOUS
  Administered 2019-01-29: 2 mg via INTRAVENOUS

## 2019-01-29 MED ORDER — OXYCODONE HCL 5 MG PO TABS *I*
5.0000 mg | ORAL_TABLET | ORAL | Status: DC | PRN
Start: 2019-01-29 — End: 2019-01-31
  Administered 2019-01-29 – 2019-01-30 (×7): 5 mg via ORAL
  Filled 2019-01-29 (×7): qty 1

## 2019-01-29 MED ORDER — DOXYCYCLINE 100 MG / 110 ML NS *I*
100.0000 mg | Freq: Two times a day (BID) | INTRAVENOUS | Status: DC
Start: 2019-01-29 — End: 2019-01-29
  Administered 2019-01-29: 100 mg via INTRAVENOUS
  Filled 2019-01-29 (×2): qty 100

## 2019-01-29 MED ORDER — LIDOCAINE HCL (PF) 1 % IJ SOLN *I*
0.1000 mL | INTRAMUSCULAR | Status: DC | PRN
Start: 2019-01-29 — End: 2019-01-29

## 2019-01-29 MED ORDER — SODIUM CHLORIDE 0.9 % IV SOLN WRAPPED *I*
125.0000 mL/h | Status: DC
Start: 2019-01-29 — End: 2019-01-29
  Administered 2019-01-29 (×2): 125 mL/h via INTRAVENOUS

## 2019-01-29 MED ORDER — SODIUM CHLORIDE 0.9 % IV SOLN WRAPPED *I*
1000.0000 mg | Freq: Three times a day (TID) | INTRAVENOUS | Status: DC
Start: 2019-01-29 — End: 2019-02-01
  Administered 2019-01-29 – 2019-02-01 (×8): 1000 mg via INTRAVENOUS
  Filled 2019-01-29 (×17): qty 10

## 2019-01-29 SURGICAL SUPPLY — 12 items
CAPTURA PRO FORCEPS WITH SPIKE (Other) ×2 IMPLANT
CLIP HEMOSTASIS ENDO INSTINCT 7FRX230CM (Supply) IMPLANT
ELECTRODE ADULT POLYHESIVE PAT RETURN (Supply) IMPLANT
FORCEPS BIOPSY RADIAL JAW LG (Other) IMPLANT
FORCEPS DISPOSABLE ALLIGATOR JAW W/NEEDLE (Supply) IMPLANT
RETRIEVER ROTH NET FOREIGN BO USE 241906 (Supply) IMPLANT
SNARE MICRO MINI (Supply)
SNARE MINI SOFT ACU 1.5 X 3 (Supply) IMPLANT
SNARE POLYP 1CMX1.5CM SFT GI MINI MIC OVL ACUSNR (Supply) IMPLANT
SNARE STANDARD SOFT ACU 2.5 X 5.5 (Supply) IMPLANT
TRAP POLYP MULTICHAMBER DISP OPTIMIZER (Other) IMPLANT
TRAPS POLY (Other)

## 2019-01-29 NOTE — ED Notes (Signed)
Pt returned from Endoscopy.

## 2019-01-29 NOTE — Student Note (Addendum)
**This is a Careers information officer note. For full assessment, please see resident or attending note(s)**    OB-GYN CONSULT NOTE      Reason for Consult (Chief Complaint): new onset LLQ pain, multiloculated ovarian cyst on CT    HPI     Annette Preston is a 33 y.o. [id number] female with a 5 mo history of hematemesis worked up by GI and anemia who presents with 3 days of abdominal pain and ongoing hematemesis.  On this ED admission, she was noted to have a new multiloculated ovarian cyst, which prompted consult. She notes her pain started on Monday with hematochezia x8, and is unlike previous pain.  Her pain is crampy, constant, periumbilical to LLQ and worse with coughing and deep breaths. She is sexually active with her husband, does not use contraception and denies concern for STIs. She endorses N/V, fever, SOB and lightheadedness. She denies chest pain, dysuria, increased vaginal discharge or bleeding.       Past Medical History   Anemia  Depression  GI Bleed - s/p EGD      Past Surgical History   Cesarean section x5  Cholecystectomy    Obstetrical / GYN History   - LMP 01/16/2019, regular, lasy 3-4 days  - G5P5005, all term, 1st c-section for failure to progress  - Sexually active - yes  - Contraception - no  - Denies history of STIs  - Denies history of abnormal paps  - She does not have a primary OB/GYN as she recently moved from [place]    Family History   - Maternal Aunt with history of breast cancer at age 49  - Paternal Grandmother with history of colon cancer  - Denies history of ovarian cancer       Allergies   No Known Allergies (drug, envir, food or latex)      Home Medications   Fe supplement  Pantoprazole  Ondansetron    Review of Systems     Constitutional: Positive for fever, chills.  Cardiovascular: Denies chest pain or palpitations.  Respiratory: Positive for SOB   Gastrointestinal: Positive for nausea/vomiting, abdominal pain    Neurologic: Positive for lightheaded.  Denies headaches  Genitourinary: Denies  dysuria, vaginal bleeding, increased vaginal discharge    Physical Exam     BP: (105-132)/(59-87)   Temp:  [36.2 C (97.2 F)-38.9 C (102.1 F)]   Temp src: Oral (02/19 1430)  Heart Rate:  [102-124]   Resp:  [15-27]   SpO2:  [92 %-100 %]      General Appearance: NAD, appears pale and faitgued  Mental Status:  Answers questions appropriately  HEENT: Normocephalic, atraumatic.  Cardiovascular: Tachycardic, RR, No MRG  Respiratory: CTAB, normal work of breathing  Abdomen: LLQ tender to palpation, soft, nondistended, no peritoneal signs, no rebound tenderness, no guarding  GU: Unremarkable external genitalia without lesions, increased tenderness to bimanual exam and speculum exam per pt report, nonvisualized cervix due to discomfort      Labs     Recent Labs   Lab 01/29/19  1011 01/29/19  0552 01/28/19  1229   WBC 12.9* 14.0* 12.3*   Hemoglobin 7.9* 7.6* 9.4*   Hematocrit 27* 26* 32*   Platelets 402* 358 532*     Recent Labs   Lab 01/29/19  1011 01/28/19  1229   Sodium 136 138   Potassium 4.0 4.8   Chloride 100 100   CO2 26 25   UN 5* 10   Creatinine 0.82 0.66  GFR,Caucasian 94 116   Glucose 98 102*    Recent Labs   Lab 01/29/19  1011 01/28/19  1229   ALT 17 21   AST 15 14   Alk Phos 71 74   Bilirubin,Total 0.5 <0.2               Lab results: 01/28/19  1229   ABO RH Blood Type A RH POS    2/18  BHCG: Neg      Urinalysis: 2/18 Neg Leuks, Neg Nitrates       Imaging      CT A/P 2/18  1. There are no acute abnormalities.      2. There is a multilobulated cystic lesion along the left ovary measuring up to 4.0 x 4.3 cm. There is a 2.5 x 2.4 cm cystic lesion inferiorly. These findings may represent an ovarian neoplasm. Recommend gynecological consultation for further evaluation.       Abdominal US 2/19 - showing adnexal mass, does not appear to have bowel involvement - full report pending  Unable to obtain transvaginal US due to discomfort    Assessment & Plan     Annette Preston is a 33 y.o. [id number] female who presents with  3 days of crampy constant L adnexal abdominal pain, negative pregnancy test, UA WNL, new multiloculated L ovarian cyst, now with fever, increased discomfort on pelvic exam. Given clinical picture, it is appropriate to assess for STI/PID/TOA, ovarian torsion, ruptured ovarian cyst and new onset UTI.  It is unlikely that her 5 mo history of hematochezia and current gynecological concerns are related. From     - retry GYN [place], unable to do due to pain   - GC/CT PCR  - Consider repeat UA and cultures   - Consider starting cefoxitin, doxycycline and flagyl for TOA treatment    Epifanio Lesches MS3

## 2019-01-29 NOTE — ED Obs Notes (Signed)
ED OBSERVATION FOLLOW-UP NOTE    Patient:  [facility]ation Unit (EOU) Progress Note        LOS: 0 days          Subjective     Seen after EGD.  Still having constant LLQ pain.  No appetite. Unable to tolerate PO.      Objective     Vitals:    Vitals:    01/29/19 0938   BP:    Pulse:    Resp:    Temp: (!) 38.1 C (100.5 F)   Weight:    Height:        O2 Flow Rate: 3 L/min (01/29/19 0805)       BP: (105-132)/(59-87)   Temp:  [36.2 C (97.2 F)-38.1 C (100.5 F)]   Temp src: Oral (02/19 0938)  Heart Rate:  [102-124]   Resp:  [15-27]   SpO2:  [92 %-100 %]        I/O last 3 completed shifts:  02/18 0700 - 02/19 0659  In: 25 (0.3 mL/kg) [IV Piggyback:25]  Out: - (0 mL/kg)   Net: 25  Weight: 92.1 kg  I/O this shift:  02/19 0700 - 02/19 1459  In: 3795.7 (41.2 mL/kg) [I.V.:3795.7]  Out: - (0 mL/kg)   Net: 3795.7  Weight: 92.1 kg        GEN: appears uncomfortable, but no distress  CVS: RRR, normal s1/s2, no murmurs, rubs or gallop  ABD: bowel sounds present, soft, tender LLQ, no mass felt  EXT: warm, dry, no edema   NEURO: Awake, alert, and cooperative.       LABS:  BMP/Electrolytes CBC   Recent Labs     01/29/19  1011 01/28/19  1229   Sodium 136 138   Potassium 4.0 4.8   Chloride 100 100   CO2 26 25   UN 5* 10   Creatinine 0.82 0.66   Glucose 98 102*   Calcium 8.4* 9.5   Albumin 3.7 4.5    Recent Labs     01/29/19  1011 01/29/19  0552 01/28/19  1229   WBC 12.9* 14.0* 12.3*   Hemoglobin 7.9* 7.6* 9.4*   Hematocrit 27* 26* 32*   Platelets 402* 358 532*   MCV 82 82 82   RDW 17.5* 17.5* 17.4*   Seg Neut % 77.0  --  80.7   Lymphocyte % 14.1  --  13.5   Monocyte % 8.0  --  4.9   Eosinophil % 0.2  --  0.2   Basophil % 0.2  --  0.2      Liver Function Cardiac Studies   Recent Labs     01/29/19  1011 01/28/19  1229   AST 15 14   ALT 17 21   Bilirubin,Total 0.5 <0.2   Bilirubin,Direct  --  <0.2     No components found with this basename: ALKPHOS   No results for input(s): CKTS, TROPU, TROP, MCKMB, CKMB  in the last 168 hours.    No components found with this basename: RICKMBS        IMAGING:  Ct Abdomen And Pelvis Without And With Contrast    Result Date: 01/28/2019  1. There are no acute abnormalities. 2. There is a multilobulated cystic lesion along the left ovary measuring up to 4.0 x 4.3 cm. There is a 2.5 x 2.4 cm cystic lesion inferiorly. These findings may represent an ovarian neoplasm.  Recommend gynecological consultation for further evaluation. END OF IMPRESSION UR Imaging submits this DICOM format image data and final report to the [facility], an independent secure electronic health information exchange, on a reciprocally searchable basis (with patient authorization) for a minimum of 12 months after exam date.    Abdomen Ap With Decubitus And/or Erect    Result Date: 01/28/2019  Nonspecific bowel gas pattern without evidence for obstruction. END OF IMPRESSION UR Imaging submits this DICOM format image data and final report to the [facility], an independent secure electronic health information exchange, on a reciprocally searchable basis (with patient authorization) for a minimum of 12 months after exam date.    *chest Standard Frontal And Lateral Views    Result Date: 01/28/2019  No acute disease in the chest. END OF IMPRESSION UR Imaging submits this DICOM format image data and final report to the [facility], an independent secure electronic health information exchange, on a reciprocally searchable basis (with patient authorization) for a minimum of 12 months after exam date.        Assessment     Assessment and Plan: This is a case of nausea, vomiting (hematemesis) in a 33 y.o. female.    New left ovarian cyst(s) compared to recent CT at [facility].  Now febrile.    Difficult to explain all findings: fever, LLQ pain, ovarian cysts, ESR/CRP elevated.  Not pregnant. Anemic.    -GYN consult to assess for PID vs ovarian torsion  -blood cultures obtained  -lactate (normal)    No indication for antibiotics  at this point    May need longer hospital admission for further work-up/care.          Glory Rosebush, MD on 01/29/2019 at 1:15 PM      Author: Glory Rosebush, MD  Note created: 01/29/2019  at: 1:15 PM     Glory Rosebush, MD  01/29/19 [phone]

## 2019-01-29 NOTE — Interval H&P Note (Signed)
UPDATES TO PATIENT'S CONDITION on the DAY OF SURGERY/PROCEDURE    I. Updates to Patient's Condition (to be completed by a provider privileged to complete a H&P, following reassessment of the patient by the provider):    Day of Surgery/Procedure Update:  History  History reviewed and no change    Physical  Physical exam updated and no change            II. Procedure Readiness   I have reviewed the patient's H&P and updated condition. By completing and signing this form, I attest that this patient is ready for surgery/procedure.    III. Attestation   I have reviewed the updated information regarding the patient's condition and it is appropriate to proceed with the planned surgery/procedure.    Maryann Alar, MD as of 8:02 AM 01/29/2019

## 2019-01-29 NOTE — Provider Consult (Addendum)
GYNECOLOGY CONSULTATION HISTORY & PHYSICAL      Consult Requested By: Dr. Richardson Landry from ED obs  Consult Question: Ovarian torsion? Neoplasm?     HPI: Annette Preston is a 33 y.o. [id number] female who presents with hematemesis and left lower quadrant pain.  She reports that she has been having hematemesis for about 5 months and she will have multiple episodes of hematemesis at once every few days.  She has been seen at [facility] with a negative work up.  She reports that starting on Monday she had associated periumbilical pain that radiates to her left lower quadrant.  She describes the pain as cramping, sharp, constant and does not radiate anywhere.  She reports the pain is relieved by the pain medications (dilaudid) that she is receiving in the ED.  She reports that this abdominal pain is new.  She denies vaginal discharge.      She reports a temperature of 100.3 at home and subjectively has fevers and chills.  She reports the pain is worse with coughing and worse with standing up and lying down.  She also reports some dizziness and lightheadedness as well as mild SOB.  She denies a history of ovarian cysts.  She saw an OB/GYN in [place] ([place]) and recently moved her and has not established gyn care here.  She also has mild SOB.  She denies CP, back pain, dysuria, hematuria, melena or blood in her stool.  She denies dyspareunia and reports had intercourse four days ago.       Past Medical History     Past Medical History:   Diagnosis Date    Anemia     Depression     GI bleed     hemetesis       Gynecologic History   Menstrual  LMP 01/15/2019  Menses are regular every 28-30 days with severe cramping. Menarche experienced at age 61.    Sexual  Patient is sexually active.    Regarding STDs, patient reports a history of no known infections.  She is currently utilizing the following contraceptive modalities: none    Cervical Cancer Screening  No history of abnormal pap smears     Past Surgical History     Past Surgical History:    Procedure Laterality Date    CESAREAN SECTION, UNSPECIFIED      CHOLECYSTECTOMY      GALLBLADDER SURGERY         Obstetric History     OB History   Gravida Para Term Preterm AB Living   _0 SAB TAB Ectopic Multiple Live Births           5      # Outcome Date GA Lbr Len/2nd Weight Sex Delivery Anes PTL Lv   5 Term 2015           4 Term 2013     CS-Unspec      3 Term 2010     CS-Unspec      2 Term 2009     CS-Unspec      1 Term      CS-Unspec          Social History     Social History     Tobacco Use    Smoking status: Never Smoker    Smokeless tobacco: Never Used   Substance Use Topics    Alcohol use: Not Currently     Frequency: Never  Drug use: Never       Family History     Family History            Colon cancer Paternal Grandmother          Allergies   No Known Allergies (drug, envir, food or latex)    Current Home Medications   Medication reconciliation performed at the time of evaluation. See Med Rec portion of patient's chart.   Notable medications include: Fe    Review of Systems  All other ROS negative unless otherwise noted in HPI.       Physical Exam     Vitals:    01/29/19 0830 01/29/19 0840 01/29/19 0933 01/29/19 0938   BP: 110/65 113/75 106/69    BP Location:       Pulse: (!) 114 105 104    Resp: 20 (!) 26 16    Temp:   37.6 C (99.7 F) (!) 38.1 C (100.5 F)   TempSrc:   Temporal Oral   SpO2: 93% 92% 93%    Weight:       Height:           General: well developed and well nourished  Mental: alert and oriented  HEENT: Normocephalic, atraumatic  Cardiovascular: Regular rate and rhythm with no murmurs  Respiratory: Clear to auscultation bilaterally, normal respiratory effort  Abdomen: soft, +BS, non-distended, moderate tenderness to palpation supraumbilical and LLQ with guarding. No rebound tenderness, no peritoneal signs  Extremities/Skin: No edema noted  Speculum exam:   Vulva: normal appearing vulva with no lesions  Vagina: normal appearing vagina with physiological discharge    Cervix: could not visualize because patient did not tolerate exam   Bimanual exam:    Uterus: normal size, no masses palpable, moderate tenderness and guarding to palpation   Cervix: present, no CMT   Right adnexa: normal and not tender   Left adnexa: normal and tender      Labs   All labs reviewed and notable for:    Neg serum pregnancy  WBC 14.0--> 12.9  Hct 26--> 27  Plt 402  CRP 161, ESR 43  Lactate 0.9  Urine: neg nitrite, neg LE    Imaging     Ct abd/pelvis (01/28/2019):   1. There are no acute abnormalities.      2. There is a multilobulated cystic lesion along the left ovary measuring up to 4.0 x 4.3 cm. There is a 2.5 x 2.4 cm cystic lesion inferiorly. These findings may represent an ovarian neoplasm. Recommend gynecological consultation for further evaluation.      END OF IMPRESSION     CT abd/pelvis (01/03/2019):     IMPRESSION:  1. No acute intra-abdominal or intrapelvic process.   2. Questionable 1.5 cm high density lesion within the upper pole of the left kidney, not properly characterized. MRI would be useful for further evaluation.    Gyn ultrasound (2/19): transabdominal: cystic structure in left adnexa, cannot to visualize left ovary, does not appear to have bowel involvement; unable to tolerate transvaginal      Assessment & Recommendations     Annette Preston is a 33 y.o. [id number]  female who presents with with left lower quadrant pain in the setting of chronic hematemesis and cystic lesion seen on left ovary.    Abdominal pain/Concern for TOA  -  left lower quadrant tenderness, no signs of an acute abdomen   -  Did not tolerate transvaginal ultrasound or speculum exam and reports  this is new pain for her.   -  T max/last (2/19): 38.9 C 1430  -  WBC 14.0--> 12.9  -  Hct 26--> 27  -  CRP 161, ESR 43  -  CT abd/pelvis (2/19): There is a multilobulated cystic lesion along the left ovary measuring up to 4.0 x 4.3 cm. There is a 2.5 x 2.4 cm cystic lesion inferiorly.  - gyn ultrasound: difficult to  adequately assess cystic lesion without transvaginal scan  - GC/CT/trich cultures sent     Recommendations:  - cefoxitin, doxycycline, flagyl for empiric treatment for TOA  - Pain control so we can try to repeat gyn ultrasound scan tomorrow with patient more comfortable  - Follow up GC/CT/trich cultures   - Continue work up for hematemesis and acute blood loss anemia as this seems unrelated.     Pt discussed with and seen by Dr. Sherrian Divers PGY 3 and Dr. Marland Kitchen.      Chyrl Civatte DO  OB/GYN PGY 1   [phone]    Addendum:  Will adit to OB/GYN service. Dr. Marland Kitchen aware.    Garlan Fair, MD  Obstetrics & Gynecology, PGY-3

## 2019-01-29 NOTE — ED Notes (Signed)
Pt transported to ultrasound.

## 2019-01-29 NOTE — Interval H&P Note (Signed)
UPDATES TO PATIENT'S CONDITION on the DAY OF SURGERY/PROCEDURE    I. Updates to Patient's Condition (to be completed by a provider privileged to complete a H&P, following reassessment of the patient by the provider):    Day of Surgery/Procedure Update:  History  History reviewed and no change    Physical  Physical exam updated and no change            II. Procedure Readiness   I have reviewed the patient's H&P and updated condition. By completing and signing this form, I attest that this patient is ready for surgery/procedure.    III. Attestation   I have reviewed the updated information regarding the patient's condition and it is appropriate to proceed with the planned surgery/procedure.    Maryann Alar, MD as of 7:44 AM 01/29/2019

## 2019-01-29 NOTE — ED Notes (Signed)
Dr. Richardson Landry notified of oral temperature at 38.9. Tylenol given. Nursing to follow.

## 2019-01-29 NOTE — ED Notes (Signed)
Patient states pain is not relieved at all from oxycodone. OBGYN resident paged.

## 2019-01-29 NOTE — Procedures (Addendum)
Procedure Report    [facility]  Gastroscopy Procedure Note     Date of Procedure: 01/29/2019   Referring Physician: No ref. provider found   Primary Care Provider: Durward Parcel (Inactive)   Location: [facility]   Attending Physician: Sanjuana Letters  Indication: Abdominal pain  Anemia  Hematemesis  Medications:Hurricaine spray, Fentanyl 75 mcg IV and Midazolam 5 mg IV were administered incrementally over the course of the procedure to achieve an adequate level of conscious sedation.   Procedure Description: After adequate sedation, the endoscope was carefully introduced into the oropharynx and passed under direct visualization into the esophagus .   The esophagus and GE junction were carefully examined, including a measurement of the Z-line at 40 cm. After advancement of the endoscope into the stomach, a careful examination was performed, including views of the antrum, incisura angularis, corpus and retroflexed views of the cardia and fundus.   The pylorus was then intubated without any difficulty and the endoscope was advanced to the 2nd/3rd portion of the duodenum. Careful examination of the second portion of the duodenum and the bulb was then performed. Findings and intervention are detailed below. The stomach was then decompressed and the endoscope withdrawn.   Overall, the patient tolerated the procedure well without undue discomfort, hypotension or desaturation. The patient recovered in the observation area and was discharged when adequately recovered.    Findings:   Esophagus:   Normal    Stomach:   Normal  Mild Gastritis of the body of stomach    Duodenum/small bowel:   Normal    Interventions:   Cytology/biopsy: Biopsies taken of Antrum to R/O H. Pylori    Complications: No    Estimated Blood Loss: There was no significant blood loss during the procedure.    Impressions:   mild gastritis of the antrum    Recommendations:   Trial of clear liquids   Pantoprazole 40 mg every day for 8  weeks  Await Gyn evaluation of Ovarian cyst to determine need for Cscope to r/o LGI source of bld loss anemia        Maryann Alar, MD             Moderate Sedation Face Times  Start Time: [phone]

## 2019-01-29 NOTE — ED Notes (Signed)
Bed: EOU-38  Expected date: 01/29/19  Expected time: 7:31 AM  Means of arrival:   Comments:  Chrisy Glascoe IN ENDOSCOPY

## 2019-01-30 ENCOUNTER — Encounter: Payer: Self-pay | Admitting: Gastroenterology

## 2019-01-30 ENCOUNTER — Observation Stay
Admission: RE | Admit: 2019-01-30 | Discharge: 2019-01-30 | Disposition: A | Discharge status: Home / Self Care | Payer: Medicaid Other | Source: Ambulatory Visit

## 2019-01-30 ENCOUNTER — Encounter
Admission: EM | Disposition: A | Discharge status: Home / Self Care | Payer: Self-pay | Source: Ambulatory Visit | Attending: Obstetrics & Gynecology

## 2019-01-30 DIAGNOSIS — R109 Unspecified abdominal pain: Secondary | ICD-10-CM

## 2019-01-30 DIAGNOSIS — R1904 Left lower quadrant abdominal swelling, mass and lump: Secondary | ICD-10-CM

## 2019-01-30 DIAGNOSIS — R1032 Left lower quadrant pain: Secondary | ICD-10-CM

## 2019-01-30 DIAGNOSIS — E669 Obesity, unspecified: Secondary | ICD-10-CM

## 2019-01-30 DIAGNOSIS — N7093 Salpingitis and oophoritis, unspecified: Secondary | ICD-10-CM

## 2019-01-30 LAB — CBC
Hematocrit: 26 % — ABNORMAL LOW (ref 34–45)
Hemoglobin: 7.3 g/dL — ABNORMAL LOW (ref 11.2–15.7)
MCH: 24 pg — ABNORMAL LOW (ref 26–32)
MCHC: 28 g/dL — ABNORMAL LOW (ref 32–36)
MCV: 83 fL (ref 79–95)
Platelets: 373 10*3/uL — ABNORMAL HIGH (ref 160–370)
RBC: 3.1 MIL/uL — ABNORMAL LOW (ref 3.9–5.2)
RDW: 17.5 % — ABNORMAL HIGH (ref 11.7–14.4)
WBC: 9.8 10*3/uL (ref 4.0–10.0)

## 2019-01-30 LAB — CBC AND DIFFERENTIAL
Baso # K/uL: 0 10*3/uL (ref 0.0–0.1)
Basophil %: 0.2 %
Eos # K/uL: 0.1 10*3/uL (ref 0.0–0.4)
Eosinophil %: 1.6 %
Hematocrit: 25 % — ABNORMAL LOW (ref 34–45)
Hemoglobin: 7.3 g/dL — ABNORMAL LOW (ref 11.2–15.7)
IMM Granulocytes #: 0 10*3/uL
IMM Granulocytes: 0.4 %
Lymph # K/uL: 1.4 10*3/uL (ref 1.2–3.7)
Lymphocyte %: 16.9 %
MCH: 24 pg — ABNORMAL LOW (ref 26–32)
MCHC: 29 g/dL — ABNORMAL LOW (ref 32–36)
MCV: 82 fL (ref 79–95)
Mono # K/uL: 0.8 10*3/uL (ref 0.2–0.9)
Monocyte %: 10.1 %
Neut # K/uL: 5.9 10*3/uL (ref 1.6–6.1)
Nucl RBC # K/uL: 0 10*3/uL (ref 0.0–0.0)
Nucl RBC %: 0 /100 WBC (ref 0.0–0.2)
Platelets: 362 10*3/uL (ref 160–370)
RBC: 3.1 MIL/uL — ABNORMAL LOW (ref 3.9–5.2)
RDW: 17.6 % — ABNORMAL HIGH (ref 11.7–14.4)
Seg Neut %: 70.8 %
WBC: 8.4 10*3/uL (ref 4.0–10.0)

## 2019-01-30 LAB — TRICHOMONAS DNA AMPLIFICATION: Trichomonas DNA amplification: 0

## 2019-01-30 LAB — HEMATOCRIT: Hematocrit: 26 % — ABNORMAL LOW (ref 34–45)

## 2019-01-30 LAB — HELICOBACTER, RAPID UREA: Helicobacter, rapid urea: 0

## 2019-01-30 LAB — MCHC: MCHC: 29 g/dL — ABNORMAL LOW (ref 32–36)

## 2019-01-30 LAB — N. GONORRHOEAE DNA AMPLIFICATION: N. gonorrhoeae DNA Amplification: 0

## 2019-01-30 LAB — CHLAMYDIA PLASMID DNA AMPLIFICATION: Chlamydia Plasmid DNA Amplification: 0

## 2019-01-30 SURGERY — EGD (ESOPHAGOGASTRODUODENOSCOPY)

## 2019-01-30 MED ORDER — HYDROMORPHONE HCL PF 0.5 MG/0.5 ML IJ SOLN *I*
0.5000 mg | INTRAMUSCULAR | Status: DC | PRN
Start: 2019-01-30 — End: 2019-02-01
  Administered 2019-01-30 – 2019-02-01 (×17): 0.5 mg via INTRAVENOUS
  Filled 2019-01-30 (×17): qty 0.5

## 2019-01-30 MED ORDER — PANTOPRAZOLE SODIUM 40 MG IV SOLR *I*
40.0000 mg | INTRAVENOUS | Status: DC
Start: 2019-01-30 — End: 2019-01-31
  Administered 2019-01-30: 40 mg via INTRAVENOUS
  Filled 2019-01-30: qty 10

## 2019-01-30 MED ORDER — INFLUENZA VAC SPLIT QUAD (FLULAVAL) 0.5 ML IM SUSY PF(>=6 MONTHS) *I*
0.5000 mL | PREFILLED_SYRINGE | INTRAMUSCULAR | Status: AC
Start: 2019-01-30 — End: 2019-02-04
  Administered 2019-02-04: 0.5 mL via INTRAMUSCULAR
  Filled 2019-01-30 (×2): qty 0.5

## 2019-01-30 MED ORDER — DIPHENHYDRAMINE HCL 25 MG ORAL SOLID *WRAPPED*
50.0000 mg | Freq: Once | ORAL | Status: AC
Start: 2019-01-30 — End: 2019-01-30
  Administered 2019-01-30: 50 mg via ORAL
  Filled 2019-01-30: qty 2

## 2019-01-30 NOTE — Progress Notes (Signed)
GYNECOLOGIC PROGRESS NOTE    LOS: 0 days     Subjective     Delainy reports feeling better today. Pain is well-controlled with current regimen and she reports mildly improved abdominal pain. She has required oxycodone for breakthrough pain last night.  Her current diet is NPO and she reports nausea and no vomiting.  She reports chills but denies fevers. Denies chest pain, SOB, and reports improved dizziness and lightheadedness.      Objective     Vitals:    01/29/19 1727 01/29/19 2111 01/30/19 0121 01/30/19 0524   BP: 109/55 100/58 (!) 99/49 93/52   BP Location: Left arm Left arm Left arm Left arm   Pulse: 106 100 85 86   Resp: _0 Temp: 37.6 C (99.7 F) 36.3 C (97.3 F) 35.7 C (96.3 F) 36.3 C (97.3 F)   TempSrc: Oral Temporal Temporal Temporal   SpO2: 95% 97% 96% 97%   Weight:       Height:         General appearance: NAD, comfortable  Lungs: Clear to auscultation bilaterally, normal respiratory effort  Heart: S1S2 RRR  Abdomen: +BS, soft, non-distended, moderate tenderness and guarding to palpation left lower quadrant, no rebound tenderness  Extremities: No cyanosis, no edema    Labs     Blood counts  Recent Labs   Lab 01/29/19  1011 01/29/19  0552 01/28/19  1229   WBC 12.9* 14.0* 12.3*   Seg Neut % 77.0  --  80.7   Neut # K/uL 9.9*  --  9.9*   Hemoglobin 7.9* 7.6* 9.4*   Hematocrit 27* 26* 32*   Platelets 402* 973 532*     Metabolic panel  Recent Labs   Lab 01/29/19  1011 01/28/19  1229   Sodium 136 138   Potassium 4.0 4.8   Chloride 100 100   CO2 26 25   UN 5* 10   Creatinine 0.82 0.66   GFR,Caucasian 94 116   Glucose 98 102*   Calcium 8.4* 9.5      Hepatobiliary testing  Recent Labs   Lab 01/29/19  1011 01/28/19  1229   Total Protein 6.7 8.0*   Albumin 3.7 4.5   ALT 17 21   AST 15 14   Alk Phos 71 74   Bilirubin,Total 0.5 <0.2   Bilirubin,Direct  --  <0.2    Coagulopathy testing  Recent Labs   Lab 01/28/19  1229   Protime 12.1   aPTT 34.6   INR 1.0        Assessment & Plan     33 y.o. [id number]  presents with hematemesis and LLQ pain with imaging suggestive of possible TOA.     LLQ pain   - CT: L ov multilobulated cystic lesion 4.0 x 4.3cm, 2.5 x 2.4 cystic lesion, possible neoplasm  - [place] final read pending, will need TVUS    TOA  - cefoxy, doxy, flagyl (2/19- )   - repeat ultrasound 2/20 (to do TV)   - GC/CT/trich swabs pending     Fever  - Tmax/last 38.9 @ 1430 on 2/19  - WBC 14.0 > 12.9 > 8.4, lactate 0.9  - blood cultures NGTD     Prophylaxis:   - GI: Foster DO  OB/GYN PGY 1   [phone]

## 2019-01-30 NOTE — ED Notes (Addendum)
Pt having bloody emesis, pt states her pain is not any better with the pills, she is also questioning if she can have something to drink, will page ob/gyn team

## 2019-01-30 NOTE — ED Notes (Signed)
Report Given To  [place] [facility]      Descriptive Sentence / Reason for Admission   Hematemesis, abdominal pain      Active Issues / Relevant Events   Abdominal pain, HCT 26, nausea & vomiting bloody emesis      To Do List  Meds per MAR, pain control, clear liquids, IV fluids      Anticipatory Guidance / Discharge Planning  Home with husband

## 2019-01-30 NOTE — Progress Notes (Signed)
Gastroenterology Progress Note    Subjective:  When ate had " bloody emesis" , no melena and no sig fall in hct  Her EGD yesterday showed gastritis only      Hospital Medications:   acetaminophen  650 mg Oral Q4H    metroNIDAZOLE  500 mg Intravenous Q8H    cefOXitin (MEFOXIN) IV  1,000 mg Intravenous Q8H    doxycycline IV  100 mg Intravenous Q12H       Objective:    Vital signs in last 24 hours:  BP: (93-107)/(49-64)   Temp:  [35.7 C (96.3 F)-36.3 C (97.3 F)]   Temp src: Temporal (02/20 1411)  Heart Rate:  [83-100]   Resp:  [16-18]   SpO2:  [96 %-98 %]       Physical Examination: General appearance - alert, well appearing, and in no distress  Abdomen - soft, nontender, nondistended, no masses or organomegaly       Lab Results in last 24 hours:  Recent Labs     01/30/19  1648 01/30/19  0528 01/29/19  1011   WBC 9.8 8.4 12.9*   Hemoglobin 7.3* 7.3* 7.9*   Hematocrit 26* 25* 27*   MCV 83 82 82   Platelets 373* 362 402*     Recent Labs   Lab 01/29/19  1011 01/28/19  1229   Sodium 136 138   Potassium 4.0 4.8   Chloride 100 100   CO2 26 25     No components found with this basename: BUN, CREATININE, EGFR, GLUCOSE, CALCIUM, LABALBU, PHOS    Recent Labs     01/29/19  1011 01/28/19  1229   AST 15 14   ALT 17 21   Alk Phos 71 74     No components found with this basename: BILI          Assessment:   Suspect pain and emesis maybe due to Abx or the PID  No evidence for sig ugib    Plan:  # Keep on clears tonite  # Follow HCT q8 for 24 hrs  # Protonix 40 mg every day po      Author: Mercy Riding MD  Off # [phone]  Cell # [phone]   Note created: 01/30/2019  at: 5:56 PM

## 2019-01-30 NOTE — Progress Notes (Signed)
Brief GYN Progress Note.     In to see patient re abdominal pain and hematemesis.     VSS. BP 107/64 (BP Location: Right arm)    Pulse 86    Temp 35.7 C (96.3 F) (Temporal)    Resp 16    Ht 1.524 m (5')    Wt 92.1 kg (203 lb)    SpO2 98%    BMI 39.65 kg/m     Patient laying in bed. Stated her hematemesis happened 15 minutes ago. Getting worse, per patient. Pain has been stable. Denies CP, SOB.    NAD  nonlabored breathing on RA  Abd: soft. Mildly tender to palpation diffusely. Normoactive bowel sounds.    Spoke with GI who will come see her.  D/c ibuprofen  Ordered IV diilaudid for breakthrough pain  CBC    Will continue to monitor.    Garlan Fair, MD  Obstetrics & Gynecology, PGY-3

## 2019-01-30 NOTE — Progress Notes (Signed)
01/30/19 1300   [facility] Patient Class Review   Patient Class Review Inpatient   Patient Class effective 01/30/19 @ 1342    OBS to IP completed - patient has [employer] - Medicaid    Helene Shoe BSN [phone]  Utilization Management

## 2019-01-31 LAB — CBC AND DIFFERENTIAL
Baso # K/uL: 0 10*3/uL (ref 0.0–0.1)
Basophil %: 0.2 %
Eos # K/uL: 0.2 10*3/uL (ref 0.0–0.4)
Eosinophil %: 2.1 %
Hematocrit: 25 % — ABNORMAL LOW (ref 34–45)
Hemoglobin: 7.4 g/dL — ABNORMAL LOW (ref 11.2–15.7)
IMM Granulocytes #: 0 10*3/uL
IMM Granulocytes: 0.5 %
Lymph # K/uL: 1.8 10*3/uL (ref 1.2–3.7)
Lymphocyte %: 21.8 %
MCH: 24 pg — ABNORMAL LOW (ref 26–32)
MCHC: 30 g/dL — ABNORMAL LOW (ref 32–36)
MCV: 82 fL (ref 79–95)
Mono # K/uL: 0.5 10*3/uL (ref 0.2–0.9)
Monocyte %: 6.5 %
Neut # K/uL: 5.7 10*3/uL (ref 1.6–6.1)
Nucl RBC # K/uL: 0 10*3/uL (ref 0.0–0.0)
Nucl RBC %: 0 /100 WBC (ref 0.0–0.2)
Platelets: 397 10*3/uL — ABNORMAL HIGH (ref 160–370)
RBC: 3.1 MIL/uL — ABNORMAL LOW (ref 3.9–5.2)
RDW: 17.2 % — ABNORMAL HIGH (ref 11.7–14.4)
Seg Neut %: 68.9 %
WBC: 8.3 10*3/uL (ref 4.0–10.0)

## 2019-01-31 LAB — MCHC: MCHC: 28 g/dL — ABNORMAL LOW (ref 32–36)

## 2019-01-31 LAB — HEMATOCRIT: Hematocrit: 25 % — ABNORMAL LOW (ref 34–45)

## 2019-01-31 MED ORDER — OXYCODONE HCL 5 MG PO TABS *I*
5.0000 mg | ORAL_TABLET | Freq: Four times a day (QID) | ORAL | Status: DC | PRN
Start: 2019-01-31 — End: 2019-02-01

## 2019-01-31 MED ORDER — PANTOPRAZOLE SODIUM 40 MG PO TBEC *I*
40.0000 mg | DELAYED_RELEASE_TABLET | Freq: Every day | ORAL | Status: DC
Start: 2019-01-31 — End: 2019-02-04
  Administered 2019-01-31 – 2019-02-04 (×5): 40 mg via ORAL
  Filled 2019-01-31 (×5): qty 1

## 2019-01-31 MED ORDER — OXYCODONE HCL 10 MG PO TABS *I*
10.0000 mg | ORAL_TABLET | Freq: Four times a day (QID) | ORAL | Status: DC | PRN
Start: 2019-01-31 — End: 2019-02-01
  Administered 2019-01-31 – 2019-02-01 (×4): 10 mg via ORAL
  Filled 2019-01-31 (×4): qty 1

## 2019-01-31 NOTE — Progress Notes (Signed)
Visited patient at bedside based on referral in chart. Patient and family member were in room but patient verbalized not needing any spiritual care services. I shared that we are available 24/7 if a need arises. Patient was agreeable and pleasant.    Rev. [facility]

## 2019-01-31 NOTE — Progress Notes (Addendum)
Assessment:  Patient states she is feeling a little better, still have some abdominal cramping, denies any bloody emesis since last night. Has been tolerating clear liquids. Dr. Donneta Romberg discussed that her EGD show mild gastritis, still feels her symptoms are related to this.     Plan:  Encourage ambulation as tolerated  Continue w/ clear liquids, until abdominal pain improves before advancing diet.  Continue to monitor HCT, current 25 and stable  Pt had just eaten and was lying flat, stressed importance to sit upright especially after eating.  Continue PPI  Did update staff with plan  Discussion and plan by Dr. Donneta Romberg was observed by Probation officer.  I have personally interviewed and examined this patient, was present and personally participated in the key elements of this evaluation, reviewed all pertinent lab and imaging date and studies for the last 24 hours, and I verify the essential points of this progress note.  WOULD HAVE PT SEE GI IN 3-4 WEEKS TO DETERMINE IF NEEDS CSCOPE TO FURTHER WORK UP ANEMIA.  Mercy Riding MD FACP  Cell # [phone]  Off # [phone]

## 2019-01-31 NOTE — Progress Notes (Signed)
Patient still with significant pain this shift, requiring frequent administration of both PO and IV pain medications. She states that the pain is now radiating from her abdomen down into her pelvis, and she is feeling the need to "push". She has tolerated only jello thus far PO without nausea. [place] gyn 2nd call resident notified of status. Nursing will continue to monitor.

## 2019-01-31 NOTE — Plan of Care (Signed)
Interdisciplinary Rounding Note   Annette Preston was discussed today during health team rounds.   Problems addressed:  Active Problems:    Abdominal pain, unspecified abdominal location    TOA (tubo-ovarian abscess)      The plan of care was reviewed with the following health team members:  Charge Nurse  Social Worker  [place] Coordinator  Physical Therapist      Carolin Sicks, RN 01/31/2019 12:04 PM

## 2019-01-31 NOTE — Progress Notes (Signed)
GYNECOLOGIC PROGRESS NOTE    LOS: 1 day     Subjective     Annette Preston reports feeling about the same today as yesterday. No further hematemesis since last night. Current diet is clear liquids. No N/V currently. No CP, SOB. Reports continued moderate abdominal cramping and pain, relieved with pain medications        Objective     Vitals:    01/30/19 1411 01/30/19 1912 01/30/19 2013 01/31/19 0000   BP: 107/64  112/60 100/60   BP Location: Right arm  Right arm Right arm   Pulse: 86  88 74   Resp: '16  16 16   '$ Temp: 35.7 C (96.3 F)  37.1 C (98.8 F) 37 C (98.6 F)   TempSrc: Temporal  Temporal Temporal   SpO2: 98%  96% 100%   Weight:  99.8 kg (220 lb)     Height:  1.524 m (5')       General appearance: NAD, comfortable  Lungs: Clear to auscultation bilaterally, normal respiratory effort  Heart: S1S2 RRR  Abdomen: +BS, soft, non-distended, moderate tenderness to palpation left lower quadrant, no rebound tenderness  Extremities: No cyanosis, no edema    Labs     Blood counts  Recent Labs   Lab 01/30/19  2334 01/30/19  1648 01/30/19  0528 01/29/19  1011  01/28/19  1229   WBC  --  9.8 8.4 12.9*   < > 12.3*   Seg Neut %  --   --  70.8 77.0  --  80.7   Neut # K/uL  --   --  5.9 9.9*  --  9.9*   Hemoglobin  --  7.3* 7.3* 7.9*   < > 9.4*   Hematocrit 26* 26* 25* 27*   < > 32*   Platelets  --  373* 362 402*   < > 532*    < > = values in this interval not displayed.     Metabolic panel  Recent Labs   Lab 01/29/19  1011 01/28/19  1229   Sodium 136 138   Potassium 4.0 4.8   Chloride 100 100   CO2 26 25   UN 5* 10   Creatinine 0.82 0.66   GFR,Caucasian 94 116   Glucose 98 102*   Calcium 8.4* 9.5      Hepatobiliary testing  Recent Labs   Lab 01/29/19  1011 01/28/19  1229   Total Protein 6.7 8.0*   Albumin 3.7 4.5   ALT 17 21   AST 15 14   Alk Phos 71 74   Bilirubin,Total 0.5 <0.2   Bilirubin,Direct  --  <0.2    Coagulopathy testing  Recent Labs   Lab 01/28/19  1229   Protime 12.1   aPTT 34.6   INR 1.0        Assessment & Plan     33  y.o. [id number] presents with hematemesis and LLQ pain with imaging suggestive of possible TOA.     LLQ pain - TOA   - CT: L ov multilobulated cystic lesion 4.0 x 4.3cm, 2.5 x 2.4 cystic lesion, possible neoplasm  - TA [place]: The left ovary is enlarged and contains a complex mass that is poorly defined.  - TVUS: poor images, probable left pelvic abscess involving left adnexa  - cefoxy, doxy, flagyl (2/19- )   - GC/CT/trich negative     Fever  - Tmax/last 38.9 @ 1430 on 2/19  - WBC 14.0 > 12.9 >  8.4 > 9.8 > 8.3  - lactate 0.9  - blood cultures NGTD     Hematemesis  - Protonix '40mg'$  daily  - Hct q 8 hrs for 24 hrs: 26 > 26  - clear liquid diet  - protonix 40 qday PO  - appreciate GI recs    Dispo: pending clinical stability.     Garlan Fair, MD  Obstetrics & Gynecology, PGY-3

## 2019-02-01 LAB — CBC AND DIFFERENTIAL
Baso # K/uL: 0 10*3/uL (ref 0.0–0.1)
Basophil %: 0.3 %
Eos # K/uL: 0.2 10*3/uL (ref 0.0–0.4)
Eosinophil %: 3.1 %
Hematocrit: 26 % — ABNORMAL LOW (ref 34–45)
Hemoglobin: 7.3 g/dL — ABNORMAL LOW (ref 11.2–15.7)
IMM Granulocytes #: 0 10*3/uL (ref 0.0–0.0)
IMM Granulocytes: 0.5 %
Lymph # K/uL: 1.7 10*3/uL (ref 1.2–3.7)
Lymphocyte %: 27.7 %
MCH: 24 pg — ABNORMAL LOW (ref 26–32)
MCHC: 29 g/dL — ABNORMAL LOW (ref 32–36)
MCV: 84 fL (ref 79–95)
Mono # K/uL: 0.5 10*3/uL (ref 0.2–0.9)
Monocyte %: 8.6 %
Neut # K/uL: 3.7 10*3/uL (ref 1.6–6.1)
Nucl RBC # K/uL: 0 10*3/uL (ref 0.0–0.0)
Nucl RBC %: 0 /100 WBC (ref 0.0–0.2)
Platelets: 442 10*3/uL — ABNORMAL HIGH (ref 160–370)
RBC: 3.1 MIL/uL — ABNORMAL LOW (ref 3.9–5.2)
RDW: 17.1 % — ABNORMAL HIGH (ref 11.7–14.4)
Seg Neut %: 59.8 %
WBC: 6.1 10*3/uL (ref 4.0–10.0)

## 2019-02-01 MED ORDER — METRONIDAZOLE 250 MG PO TABS *I*
500.0000 mg | ORAL_TABLET | Freq: Two times a day (BID) | ORAL | Status: DC
Start: 2019-02-01 — End: 2019-02-04
  Administered 2019-02-01 – 2019-02-04 (×6): 500 mg via ORAL
  Filled 2019-02-01 (×8): qty 2

## 2019-02-01 MED ORDER — ENOXAPARIN SODIUM 40 MG/0.4ML IJ SOSY *I*
40.0000 mg | PREFILLED_SYRINGE | INTRAMUSCULAR | Status: DC
Start: 2019-02-01 — End: 2019-02-03
  Administered 2019-02-01 – 2019-02-02 (×2): 40 mg via SUBCUTANEOUS
  Filled 2019-02-01 (×2): qty 0.4

## 2019-02-01 MED ORDER — BISACODYL 10 MG RE SUPP *I*
10.0000 mg | Freq: Once | RECTAL | Status: AC
Start: 2019-02-01 — End: 2019-02-01
  Administered 2019-02-01: 10 mg via RECTAL

## 2019-02-01 MED ORDER — ACETAMINOPHEN 325 MG PO TABS *I*
650.0000 mg | ORAL_TABLET | Freq: Four times a day (QID) | ORAL | Status: DC
Start: 2019-02-01 — End: 2019-02-04
  Administered 2019-02-01 – 2019-02-04 (×11): 650 mg via ORAL
  Filled 2019-02-01 (×14): qty 2

## 2019-02-01 MED ORDER — IBUPROFEN 600 MG PO TABS *I*
600.0000 mg | ORAL_TABLET | Freq: Four times a day (QID) | ORAL | Status: DC
Start: 2019-02-01 — End: 2019-02-01
  Filled 2019-02-01 (×2): qty 1

## 2019-02-01 MED ORDER — SENNOSIDES 8.6 MG PO TABS *I*
1.0000 | ORAL_TABLET | Freq: Every day | ORAL | Status: DC
Start: 2019-02-01 — End: 2019-02-04
  Administered 2019-02-01 – 2019-02-04 (×4): 1 via ORAL
  Filled 2019-02-01 (×4): qty 1

## 2019-02-01 MED ORDER — DOXYCYCLINE HYCLATE 100 MG PO TABS *I*
100.0000 mg | ORAL_TABLET | Freq: Two times a day (BID) | ORAL | Status: DC
Start: 2019-02-01 — End: 2019-02-04
  Administered 2019-02-01 – 2019-02-04 (×6): 100 mg via ORAL
  Filled 2019-02-01 (×6): qty 1

## 2019-02-01 MED ORDER — HYDROMORPHONE HCL PF 0.5 MG/0.5 ML IJ SOLN *I*
0.5000 mg | Freq: Once | INTRAMUSCULAR | Status: AC
Start: 2019-02-01 — End: 2019-02-01
  Administered 2019-02-01: 0.5 mg via INTRAVENOUS
  Filled 2019-02-01: qty 0.5

## 2019-02-01 MED ORDER — SODIUM CHLORIDE 0.9 % 100 ML IV SOLN *I*
12.5000 mg | Freq: Once | INTRAVENOUS | Status: AC
Start: 2019-02-01 — End: 2019-02-01
  Administered 2019-02-01: 12.5 mg via INTRAVENOUS
  Filled 2019-02-01: qty 1

## 2019-02-01 MED ORDER — OXYCODONE HCL 10 MG PO TABS *I*
10.0000 mg | ORAL_TABLET | ORAL | Status: AC | PRN
Start: 2019-02-01 — End: 2019-02-04
  Administered 2019-02-01 – 2019-02-04 (×16): 10 mg via ORAL
  Filled 2019-02-01 (×17): qty 1

## 2019-02-01 MED ORDER — POLYETHYLENE GLYCOL 3350 PO PACK 17 GM *I*
17.0000 g | PACK | Freq: Every day | ORAL | Status: DC
Start: 2019-02-02 — End: 2019-02-04
  Administered 2019-02-02 – 2019-02-04 (×3): 17 g via ORAL
  Filled 2019-02-01 (×4): qty 17

## 2019-02-01 MED ORDER — OXYCODONE HCL 5 MG PO TABS *I*
5.0000 mg | ORAL_TABLET | ORAL | Status: AC | PRN
Start: 2019-02-01 — End: 2019-02-04

## 2019-02-01 NOTE — Progress Notes (Signed)
GYNECOLOGIC PROGRESS NOTE    LOS: 2 days     Subjective     Margarie reports feeling continued significant pain. Mostly in LLQ. Relieved with narcotics but recurs between. Has not had a BM since Tuesday. No further hematemesis for over 24h. Current diet is clear liquids. Intermittent nausea. No CP, SOB.     Objective     Vitals:    01/31/19 1933 01/31/19 2339 02/01/19 0220 02/01/19 0338   BP: 120/80 120/80  114/76   BP Location: Left arm Left arm  Left arm   Pulse: 87 95  93   Resp: '16 16  16   '$ Temp: 36.5 C (97.7 F) 36.4 C (97.5 F) 37.3 C (99.1 F) 36.8 C (98.2 F)   TempSrc: Temporal Temporal Oral Temporal   SpO2: 95% 96%  94%   Weight:       Height:         General appearance: NAD, comfortable  Lungs: Breathing comfortably on RA  Heart: Warm and well perfused, normal rate  Abdomen: +BS, soft, minimally distended, moderate tenderness to palpation left lower quadrant, no rebound tenderness  Extremities: No cyanosis, no edema    Labs     Blood counts  Recent Labs   Lab 02/01/19  0519 01/31/19  1247 01/31/19  0625  01/30/19  1648 01/30/19  0528   WBC 6.1  --  8.3  --  9.8 8.4   Seg Neut % 59.8  --  68.9  --   --  70.8   Neut # K/uL 3.7  --  5.7  --   --  5.9   Hemoglobin 7.3*  --  7.4*  --  7.3* 7.3*   Hematocrit 26* 25* 25*   < > 26* 25*   Platelets 442*  --  397*  --  373* 362    < > = values in this interval not displayed.     Metabolic panel  Recent Labs   Lab 01/29/19  1011 01/28/19  1229   Sodium 136 138   Potassium 4.0 4.8   Chloride 100 100   CO2 26 25   UN 5* 10   Creatinine 0.82 0.66   GFR,Caucasian 94 116   Glucose 98 102*   Calcium 8.4* 9.5      Hepatobiliary testing  Recent Labs   Lab 01/29/19  1011 01/28/19  1229   Total Protein 6.7 8.0*   Albumin 3.7 4.5   ALT 17 21   AST 15 14   Alk Phos 71 74   Bilirubin,Total 0.5 <0.2   Bilirubin,Direct  --  <0.2    Coagulopathy testing  Recent Labs   Lab 01/28/19  1229   Protime 12.1   aPTT 34.6   INR 1.0        Assessment & Plan     33 y.o. [id number] presented  with hematemesis and LLQ pain with imaging suggestive of possible TOA. Clinically improved as has been afebrile for > 48h (Tmax/last 38.9 @ 1430 on 2/19), leukocytosis resolved, tolerating full liquid diet, however continues to report significant pain. Suspect pain being exacerbated by constipation as patient has not had BM in 5 days and is not on bowel regimen with significant narcotic use.    LLQ pain - TOA   - CT: L ov multilobulated cystic lesion 4.0 x 4.3cm, 2.5 x 2.4 cystic lesion, possible neoplasm  - TA [place]: The left ovary is enlarged and contains a complex mass that is  poorly defined.  - TVUS: poor images, probable left pelvic abscess involving left adnexa  - cefoxy, doxy, flagyl (2/19- )   - GC/CT/trich negative   - Suppository, senna  - Pain: d/c dilaudid --> oxy 5-10 prn, tylenol and motrin q6 scheduled    Fever  - Tmax/last 38.9 @ 1430 on 2/19  - WBC normalized  - lactate 0.9  - blood cultures NGTD     Hematemesis  - Protonix '40mg'$  daily  - Hct stable at 26  - clear liquid diet  - appreciate GI recs    Dispo: Pending ability to transition to PO abx, control pain    Dale Durham, MD  Obstetrics & Gynecology, PGY-1  PGR [phone]

## 2019-02-02 LAB — CBC AND DIFFERENTIAL
Baso # K/uL: 0 10*3/uL (ref 0.0–0.1)
Basophil %: 0.3 %
Eos # K/uL: 0.2 10*3/uL (ref 0.0–0.4)
Eosinophil %: 2.4 %
Hematocrit: 28 % — ABNORMAL LOW (ref 34–45)
Hemoglobin: 8 g/dL — ABNORMAL LOW (ref 11.2–15.7)
IMM Granulocytes #: 0 10*3/uL (ref 0.0–0.0)
IMM Granulocytes: 0.2 %
Lymph # K/uL: 1.7 10*3/uL (ref 1.2–3.7)
Lymphocyte %: 28.1 %
MCH: 24 pg — ABNORMAL LOW (ref 26–32)
MCHC: 29 g/dL — ABNORMAL LOW (ref 32–36)
MCV: 82 fL (ref 79–95)
Mono # K/uL: 0.5 10*3/uL (ref 0.2–0.9)
Monocyte %: 8.4 %
Neut # K/uL: 3.7 10*3/uL (ref 1.6–6.1)
Nucl RBC # K/uL: 0 10*3/uL (ref 0.0–0.0)
Nucl RBC %: 0 /100 WBC (ref 0.0–0.2)
Platelets: 421 10*3/uL — ABNORMAL HIGH (ref 160–370)
RBC: 3.4 MIL/uL — ABNORMAL LOW (ref 3.9–5.2)
RDW: 17.2 % — ABNORMAL HIGH (ref 11.7–14.4)
Seg Neut %: 60.6 %
WBC: 6.2 10*3/uL (ref 4.0–10.0)

## 2019-02-02 LAB — EKG 12-LEAD
P: 59 deg
PR: 147 ms
QRS: 80 deg
QRSD: 107 ms
QT: 357 ms
QTc: 461 ms
Rate: 100 {beats}/min
T: 114 deg

## 2019-02-02 MED ORDER — DIPHENHYDRAMINE HCL 25 MG ORAL SOLID *WRAPPED*
50.0000 mg | Freq: Once | ORAL | Status: AC
Start: 2019-02-02 — End: 2019-02-02
  Administered 2019-02-02: 50 mg via ORAL
  Filled 2019-02-02: qty 2

## 2019-02-02 MED ORDER — KETOROLAC TROMETHAMINE 30 MG/ML IJ SOLN *I*
30.0000 mg | Freq: Four times a day (QID) | INTRAMUSCULAR | Status: DC | PRN
Start: 2019-02-02 — End: 2019-02-04
  Administered 2019-02-02 – 2019-02-03 (×5): 30 mg via INTRAVENOUS
  Filled 2019-02-02 (×5): qty 1

## 2019-02-02 MED ORDER — KETOROLAC TROMETHAMINE 10 MG PO TABS *I*
10.0000 mg | ORAL_TABLET | Freq: Four times a day (QID) | ORAL | Status: DC | PRN
Start: 2019-02-02 — End: 2019-02-02

## 2019-02-02 MED ORDER — ONDANSETRON HCL 4 MG PO TABS *I*
4.0000 mg | ORAL_TABLET | Freq: Three times a day (TID) | ORAL | Status: DC | PRN
Start: 2019-02-02 — End: 2019-02-04
  Filled 2019-02-02 (×2): qty 1

## 2019-02-02 MED ORDER — SODIUM CHLORIDE 0.9 % 100 ML IV SOLN *I*
12.5000 mg | Freq: Four times a day (QID) | INTRAVENOUS | Status: DC | PRN
Start: 2019-02-02 — End: 2019-02-04
  Administered 2019-02-02 – 2019-02-04 (×7): 12.5 mg via INTRAVENOUS
  Filled 2019-02-02 (×7): qty 1

## 2019-02-02 MED ORDER — HYDROMORPHONE HCL PF 0.5 MG/0.5 ML IJ SOLN *I*
0.5000 mg | Freq: Once | INTRAMUSCULAR | Status: AC
Start: 2019-02-03 — End: 2019-02-02
  Administered 2019-02-02: 0.5 mg via INTRAVENOUS
  Filled 2019-02-02: qty 0.5

## 2019-02-02 NOTE — Progress Notes (Signed)
Writer called into room by patient who states she is in "so much pain". Patient in bed, curled up, grasping stomach. She states she is having trouble breathing d/t her pain, and that it is now radiating towards her back and side. She is not currently due for any pain medication. [place] gyn 2nd call resident paged to come assess.

## 2019-02-02 NOTE — Progress Notes (Signed)
ITSP after call from nurse. Pt reports an increase in pain over the last few hours. She denies any current nausea, last vomited 12 hours ago. Reports the pain is entirely in her LLQ.    Exam:   Gen: pt laying comfortably in bed  Abdomen: soft, non-distended, + rebound tenderness, no referred pain, no guarding    Assessment: likely peritoneal irritation from TOA, unlikely ruptured at this time    Plan:   - toradol 30mg  IV   - Benadryl 50mg  for sleep  - GYN USN in the AM    Jacinto Reap, MD  OB/GYN PGY-2  [facility]

## 2019-02-02 NOTE — Progress Notes (Signed)
Resident and attending both up to evaluate patient for increase in pain. Added IV Toradol and Benadryl and ordered GYN ultrasound for later this morning.    Patient given IV Toradol, and PO Tylenol and Benadryl at 0344 per order. At 0448, patient called writer into room, stated Toradol did not help with pain. Patient with abd guarding at this time. PO oxy 10 mg administered at this time and GYN 2nd call notified. Patient encouraged to walk as able, and continue drinking water and liquids as tolerated. Nursing will continue to monitor pain level.

## 2019-02-02 NOTE — Progress Notes (Signed)
OB/GYN Attending Note    Up to see patient for continued complaints of LLQ pain.  Lying comfortably in bed when I entered room, watching a video on her phone.  Able to shift positions comfortably in bed    Reviewed VS, normotensive, AF, normal RR.    Abdomen soft, non-distended, upper quadrants and RLQ non-tender with deep palpation.  Moderate pain with deep palpation in LLQ, voluntary guarding, no rebound, exam unchanged from my prior exam earlier in the day.    Will try IV toradol given that she is unable to tolerate PO NSAIDs, one oral dose of oxy IR for BTP, she is struggling to rest so will also try IV benadryl.  Will try to avoid IV dilaudid as much as possible.     Again discussed that I do not think she has rupture of TOA, given stable abdominal exam and VS.    Will re-attempt TVUS later in the day today to better characterize mass.     Laruth Bouchard, MD  OB/GYN Attending  Pager [phone]

## 2019-02-02 NOTE — Progress Notes (Signed)
GYNECOLOGIC PROGRESS NOTE    LOS: 3 days     Subjective     Annette Preston reports feeling continued significant pain overnight. Had BM after suppository yesterday. Does not feel it changed her pain. No further hematemesis. Current diet is clear liquids - nauseated so PO minimal. No CP, SOB.       Objective     Vitals:    02/01/19 1944 02/02/19 0028 02/02/19 0246 02/02/19 0331   BP: 118/78 124/84  120/90   BP Location: Left arm Left arm  Left arm   Pulse: 95 97  83   Resp: _0 Temp: 36.9 C (98.4 F) 36.8 C (98.2 F)  37.1 C (98.8 F)   TempSrc: Temporal Temporal  Temporal   SpO2: 99% 96%  95%   Weight:       Height:         General appearance: NAD, comfortable  Lungs: Breathing comfortably on RA  Heart: Warm and well perfused, normal rate  Abdomen: +BS, soft, non distended, moderate tenderness to palpation of SP area and left lower quadrant with voluntary guarding, no rebound tenderness  Extremities: No cyanosis, no edema    Labs     Blood counts  Recent Labs   Lab 02/02/19  0352 02/01/19  0519 01/31/19  1247 01/31/19  0625   WBC 6.2 6.1  --  8.3   Seg Neut % 60.6 59.8  --  68.9   Neut # K/uL 3.7 3.7  --  5.7   Hemoglobin 8.0* 7.3*  --  7.4*   Hematocrit 28* 26* 25* 25*   Platelets 421* 442*  --  643*     Metabolic panel  Recent Labs   Lab 01/29/19  1011 01/28/19  1229   Sodium 136 138   Potassium 4.0 4.8   Chloride 100 100   CO2 26 25   UN 5* 10   Creatinine 0.82 0.66   GFR,Caucasian 94 116   Glucose 98 102*   Calcium 8.4* 9.5      Hepatobiliary testing  Recent Labs   Lab 01/29/19  1011 01/28/19  1229   Total Protein 6.7 8.0*   Albumin 3.7 4.5   ALT 17 21   AST 15 14   Alk Phos 71 74   Bilirubin,Total 0.5 <0.2   Bilirubin,Direct  --  <0.2    Coagulopathy testing  Recent Labs   Lab 01/28/19  1229   Protime 12.1   aPTT 34.6   INR 1.0        Assessment & Plan     33 y.o. [id number] admitted 2/21with hematemesis (longstanding), anemia, fevers, leukocytosis, found on CTA to have a complex left adnexal mass c/f TOA -  measuring 4cm in greatest dimension.  Did have upper endoscopy with GI that did not identify active bleeding.  Clinically improved as has been afebrile for > 48h (Tmax/last 38.9 @ 1430 on 2/19), leukocytosis resolved, tolerating full liquid diet, however continues to report significant pain out of proportion to exam. Low suspicion for ruptured TOA as no peritoneal signs/stable abdominal exam and no vital sign derangements.     LLQ pain - TOA   - CT: L ov multilobulated cystic lesion 4.0 x 4.3cm, 2.5 x 2.4 cystic lesion, possible neoplasm  - TA [place]: The left ovary is enlarged and contains a complex mass that is poorly defined.  - GC/CT/trich negative   - cefoxy, doxy, flagyl (2/19- )   - Pain:  oxy 5-10 prn, tylenol q6 scheduled  - TVUS today to better characterize mass    Hematemesis (Currently resolved)  - Protonix 73m daily  - Hct stable at 26  - clear liquid diet   - appreciate GI recs    Dispo: Pending ability to transition to PO abx, control pain    BDale Durham MD  Obstetrics & Gynecology, PGY-1  PGR #[phone]

## 2019-02-02 NOTE — Invasive Procedure Plan of Care (Signed)
[place]  OR SURGICAL PROCEDURE                            Patient Name: Annette Preston  [facility] [address]                                                              DOB: 11-06-1986         Please read this form or have someone read it to you.   It's important to understand all parts of this form. If something isn't clear, ask [place] to explain.   When you sign it, that means you understand the form and give [place] permission to do this surgery or procedure.     I agree for Anderson Malta, MD along with any assistants* they may choose, to treat the following condition(s): C/f ovarian torsion   By doing this surgery or procedure on me: Put a camera in the abdomen, possible removal of an ovary, possible removal of a cyst   This is also known as: Diagnostic laparoscopy, possible oophorectomy, possible cystectomy   Laterality: Left     *if you'd like a list of the assistants, please ask. We can give that to you.    1. The care provider has explained my condition to me. They have told me how the procedure can help me. They have told me about other ways of treating my condition. I understand the care provider cannot guarantee the result of the procedure. If I don't have this procedure, my other choices are: No procedure (not recommended)    2. The care provider has told me the risks (problems that can happen) of the procedure. I understand there may be unwanted results. The risks that are related to this procedure include: Bleeding, infection, risk of damage to surround structures (uterus, fallopian tubes, ovary, bowel, bladder), risk of needing an additional procedure, risk of larger incision,     3. I understand that during the procedure, my care provider may find a condition that we didn't know about before the treatment started. Therefore, I agree that my care provider can perform any other treatment which they think is necessary and  available.    4. I understand the care provider may remove tissue, body parts, or materials during this procedure. These materials may be used to help with my diagnosis and treatment. They might also be used for teaching purposes or for research studies that I have separately agreed to participate in. Otherwise they will be disposed of as required by law.    5. My care provider might want a representative from a [place] to be there during my procedure. I understand that person works for:          The ways they might help my care provider during my procedure include:            6. Here are my decisions about receiving blood, blood products, or tissues. I understand my decisions cover the time before, during and after my procedure, my treatment, and my time in the hospital. After my procedure, if my condition changes a lot, my care provider will talk  with me again about receiving blood or blood products. At that time, my care provider might need me to review and sign another consent form, about getting or refusing blood.    I understand that the blood is from the community blood supply. Volunteers donated the blood, the volunteers were screened for health problems. The blood was examined with very sensitive and accurate tests to look for hepatitis, HIV/AIDS, and other diseases. Before I receive blood, it is tested again to make sure it is the correct type.    My chances of getting a sickness from blood products are small. But no transfusion is 100% safe. I understand that my care provider feels the good I will receive from the blood is greater than the chances of something going wrong. My care provider has answered my questions about blood products.      My decision  about blood or  blood products   Yes, I agree to receive blood or blood products if my care provider thinks they're needed.        My decision   about tissue  Implants     Not applicable.          I understand this  form.    My care  provider  or his/her  assistants have  explained:   What I am having done and why I need it.  What other choices I can make instead of having this done.  The benefits and possible risks (problems) to me of having this done.  The benefits and possible risks (problems) to me of receiving transplants, blood, or blood products.  There is no guarantee of the results.  The care provider may not stay with me the entire time that I am in the operating or procedure room.  My provider has explained how this may affect my procedure. My provider has answered my  questions about this.         I give my  permission for  this surgery or  procedure.            _______________________________________________                                     My signature  (or parent or other person authorized to sign for you, if you are unable to sign for  yourself or if you are under 34 years old)        ______           Date        _____        Time   Electronic Signatures will display at the bottom of the consent form.    Care provider's statement: I have discussed the planned procedure, including the possibility for transfusion of blood  products or receipt of tissue as necessary; expected benefits; the possible complications and risks; and possible alternatives  and their benefits and risks with the patients or the patient's surrogate. In my opinion, the patient or the patient's surrogate  understands the proposed procedure, its risks, benefits and alternatives.              Electronically signed by: Park Liter, MD  02/02/2019         Date        11:12 PM        Time   Your doctor or someone your doctor has appointed has told you that you may need blood or a blood product transfusion, which has been collected from volunteers, as part of your treatment as a patient.    The reasons you might need blood or blood products include, but are not limited to:    Significant loss of your own  blood   Your body may not be getting enough oxygen to its tissues    Treatment of bleeding disorders caused by low platelet counts or platelets that do not work right (platelets are part of a cell that helps to form clots and keeps you from bleeding too much).   You may not have enough of other substances that help your blood clot or stop you from bleeding more  The risks of getting a transfusion of blood or blood products include, but are not limited to:    Damage to the lungs   Difficulty breathing due to fluid in the lungs   The product may contain bacteria or rarely a virus (which includes HIV and Hepatitis)   Blood from the community blood supply has been collected from volunteer donors who have been screened for health risk. The blood has been tested for major blood transmitted disease, but no transfusion is 100% safe. The blood is tested with very sensitive and accurate tests to screen for hepatitis, AIDS, and other disease, which makes the risks very small.   You may have side effects from the transfusion (rash, fever, chills) or an allergic reaction   The transfusion increases your risks of getting infection or cancer coming back   The transfusion can increase the time you have to stay in the hospital   The transfusion can potentially cause death if the wrong blood is given or your body rejects the blood   Before blood is transfused, it is tested again to make sure it is the correct type  There are other options than getting blood or blood products from other people and they include:    Drugs which can decrease bleeding   Drugs which can cause your body to make more blood (used in elective procedures with advance notice)   Autologous (your own blood) donation (needs pre-arrangement)   No transfusion  If you exercise your right to refuse to be transfused with blood or blood products; these things listed below, among others, could happen to you:   Your body may not get enough oxygen and  suffer damage   You may have a higher chance of bleeding   You may limit other options for your condition   You may die from losing too much blood

## 2019-02-03 ENCOUNTER — Encounter
Admission: EM | Disposition: A | Discharge status: Home / Self Care | Payer: Self-pay | Source: Ambulatory Visit | Attending: Obstetrics & Gynecology

## 2019-02-03 ENCOUNTER — Inpatient Hospital Stay: Payer: Medicaid Other | Admitting: Anesthesiology

## 2019-02-03 DIAGNOSIS — K660 Peritoneal adhesions (postprocedural) (postinfection): Secondary | ICD-10-CM

## 2019-02-03 DIAGNOSIS — N736 Female pelvic peritoneal adhesions (postinfective): Secondary | ICD-10-CM

## 2019-02-03 DIAGNOSIS — Z9889 Other specified postprocedural states: Secondary | ICD-10-CM

## 2019-02-03 HISTORY — PX: PR LAPS ABD PRTM&OMENTUM DX W/WO SPEC BR/WA SPX: 49320

## 2019-02-03 HISTORY — PX: PR LAP,DIAGNOSTIC ABDOMEN: 49320

## 2019-02-03 LAB — CBC AND DIFFERENTIAL
Baso # K/uL: 0 10*3/uL (ref 0.0–0.1)
Basophil %: 0.4 %
Eos # K/uL: 0.2 10*3/uL (ref 0.0–0.4)
Eosinophil %: 3.4 %
Hematocrit: 28 % — ABNORMAL LOW (ref 34–45)
Hemoglobin: 8.1 g/dL — ABNORMAL LOW (ref 11.2–15.7)
IMM Granulocytes #: 0 10*3/uL (ref 0.0–0.0)
IMM Granulocytes: 0.4 %
Lymph # K/uL: 1.3 10*3/uL (ref 1.2–3.7)
Lymphocyte %: 25.7 %
MCH: 24 pg — ABNORMAL LOW (ref 26–32)
MCHC: 29 g/dL — ABNORMAL LOW (ref 32–36)
MCV: 83 fL (ref 79–95)
Mono # K/uL: 0.4 10*3/uL (ref 0.2–0.9)
Monocyte %: 7.2 %
Neut # K/uL: 3.2 10*3/uL (ref 1.6–6.1)
Nucl RBC # K/uL: 0 10*3/uL (ref 0.0–0.0)
Nucl RBC %: 0 /100 WBC (ref 0.0–0.2)
Platelets: 413 10*3/uL — ABNORMAL HIGH (ref 160–370)
RBC: 3.4 MIL/uL — ABNORMAL LOW (ref 3.9–5.2)
RDW: 17 % — ABNORMAL HIGH (ref 11.7–14.4)
Seg Neut %: 62.9 %
WBC: 5 10*3/uL (ref 4.0–10.0)

## 2019-02-03 LAB — BLOOD CULTURE: Bacterial Blood Culture: 0

## 2019-02-03 LAB — POCT GLUCOSE: Glucose POCT: 93 mg/dL (ref 60–99)

## 2019-02-03 SURGERY — LAPAROSCOPY, DIAGNOSTIC
Anesthesia: General | Site: Abdomen | Wound class: Clean

## 2019-02-03 MED ORDER — METOPROLOL TARTRATE 1 MG/ML IV SOLN *I*
INTRAVENOUS | Status: AC
Start: 2019-02-03 — End: 2019-02-03
  Filled 2019-02-03: qty 5

## 2019-02-03 MED ORDER — LACTATED RINGERS IV SOLN *I*
125.0000 mL/h | INTRAVENOUS | Status: DC
Start: 2019-02-03 — End: 2019-02-04
  Administered 2019-02-03: 125 mL/h
  Administered 2019-02-03: 125 mL/h via INTRAVENOUS
  Administered 2019-02-04 (×2): 125 mL/h
  Administered 2019-02-04: 125 mL/h via INTRAVENOUS

## 2019-02-03 MED ORDER — SODIUM CHLORIDE 0.9 % 100 ML IV SOLN *I*
6.2500 mg | Freq: Once | INTRAVENOUS | Status: DC | PRN
Start: 2019-02-03 — End: 2019-02-03

## 2019-02-03 MED ORDER — ONDANSETRON HCL 2 MG/ML IV SOLN *I*
INTRAMUSCULAR | Status: DC | PRN
Start: 2019-02-03 — End: 2019-02-03
  Administered 2019-02-03: 4 mg via INTRAMUSCULAR

## 2019-02-03 MED ORDER — ALBUTEROL SULFATE (2.5 MG/3ML) 0.083% IN NEBU *I*
2.5000 mg | INHALATION_SOLUTION | Freq: Once | RESPIRATORY_TRACT | Status: DC | PRN
Start: 2019-02-03 — End: 2019-02-03

## 2019-02-03 MED ORDER — GLYCOPYRROLATE 0.2 MG/ML IJ SOLN *WRAPPED*
INTRAMUSCULAR | Status: AC
Start: 2019-02-03 — End: 2019-02-03
  Filled 2019-02-03: qty 2

## 2019-02-03 MED ORDER — LACTATED RINGERS IV SOLN *I*
125.0000 mL/h | INTRAVENOUS | Status: DC
Start: 2019-02-03 — End: 2019-02-03

## 2019-02-03 MED ORDER — BUPIVACAINE HCL 0.25 % IJ SOLUTION *WRAPPED*
Status: DC | PRN
Start: 2019-02-03 — End: 2019-02-03
  Administered 2019-02-03: 3 mL via SUBCUTANEOUS
  Administered 2019-02-03: 14 mL via SUBCUTANEOUS

## 2019-02-03 MED ORDER — HYDROMORPHONE HCL PF 1 MG/ML IJ SOLN *WRAPPED*
INTRAMUSCULAR | Status: AC
Start: 2019-02-03 — End: 2019-02-03
  Filled 2019-02-03: qty 1

## 2019-02-03 MED ORDER — BUPIVACAINE HCL 0.25 % IJ SOLUTION *WRAPPED*
Status: AC
Start: 2019-02-03 — End: 2019-02-03
  Filled 2019-02-03: qty 30

## 2019-02-03 MED ORDER — NEOSTIGMINE METHYLSULFATE 1 MG/ML IJ SOLN WRAPPED *I*
Status: DC | PRN
Start: 2019-02-03 — End: 2019-02-03
  Administered 2019-02-03: 4 mg via INTRAVENOUS

## 2019-02-03 MED ORDER — LIDOCAINE HCL 2 % (PF) IJ SOLN *I*
INTRAMUSCULAR | Status: AC
Start: 2019-02-03 — End: 2019-02-03
  Filled 2019-02-03: qty 5

## 2019-02-03 MED ORDER — NEOSTIGMINE METHYLSULFATE 10 MG/10ML IV SOLN *I*
INTRAVENOUS | Status: AC
Start: 2019-02-03 — End: 2019-02-03
  Filled 2019-02-03: qty 3

## 2019-02-03 MED ORDER — ONDANSETRON HCL 2 MG/ML IV SOLN *I*
4.0000 mg | Freq: Once | INTRAMUSCULAR | Status: AC | PRN
Start: 2019-02-03 — End: 2019-02-03
  Administered 2019-02-03: 4 mg via INTRAVENOUS
  Filled 2019-02-03: qty 2

## 2019-02-03 MED ORDER — DEXAMETHASONE SODIUM PHOSPHATE 4 MG/ML INJ SOLN *WRAPPED*
INTRAMUSCULAR | Status: AC
Start: 2019-02-03 — End: 2019-02-03
  Filled 2019-02-03: qty 1

## 2019-02-03 MED ORDER — FENTANYL CITRATE 50 MCG/ML IJ SOLN *WRAPPED*
INTRAMUSCULAR | Status: AC
Start: 2019-02-03 — End: 2019-02-03
  Filled 2019-02-03: qty 2

## 2019-02-03 MED ORDER — LIDOCAINE HCL 2 % IJ SOLN *I*
INTRAMUSCULAR | Status: DC | PRN
Start: 2019-02-03 — End: 2019-02-03
  Administered 2019-02-03: 50 mg via INTRAVENOUS

## 2019-02-03 MED ORDER — ENOXAPARIN SODIUM 40 MG/0.4ML IJ SOSY *I*
40.0000 mg | PREFILLED_SYRINGE | INTRAMUSCULAR | Status: DC
Start: 2019-02-04 — End: 2019-02-04
  Administered 2019-02-04: 40 mg via SUBCUTANEOUS
  Filled 2019-02-03: qty 0.4

## 2019-02-03 MED ORDER — HYDROMORPHONE HCL PF 1 MG/ML IJ SOLN *WRAPPED*
INTRAMUSCULAR | Status: DC | PRN
Start: 2019-02-03 — End: 2019-02-03
  Administered 2019-02-03 (×2): 0.5 mg via INTRAVENOUS

## 2019-02-03 MED ORDER — METOPROLOL TARTRATE 1 MG/ML IV SOLN *I*
INTRAVENOUS | Status: DC | PRN
Start: 2019-02-03 — End: 2019-02-03
  Administered 2019-02-03: 2 mg via INTRAVENOUS

## 2019-02-03 MED ORDER — GLYCOPYRROLATE 0.2 MG/ML IJ SOLN *WRAPPED*
INTRAMUSCULAR | Status: DC | PRN
Start: 2019-02-03 — End: 2019-02-03
  Administered 2019-02-03: 0.6 mg via INTRAVENOUS

## 2019-02-03 MED ORDER — ONDANSETRON HCL 2 MG/ML IV SOLN *I*
INTRAMUSCULAR | Status: AC
Start: 2019-02-03 — End: 2019-02-03
  Filled 2019-02-03: qty 2

## 2019-02-03 MED ORDER — MEPERIDINE HCL 25 MG/ML IJ SOLN *I*
12.5000 mg | INTRAMUSCULAR | Status: DC | PRN
Start: 2019-02-03 — End: 2019-02-03

## 2019-02-03 MED ORDER — ROCURONIUM BROMIDE 10 MG/ML IV SOLN *WRAPPED*
Status: AC
Start: 2019-02-03 — End: 2019-02-03
  Filled 2019-02-03: qty 5

## 2019-02-03 MED ORDER — HYDROMORPHONE HCL PF 0.5 MG/0.5 ML IJ SOLN *I*
0.5000 mg | INTRAMUSCULAR | Status: DC | PRN
Start: 2019-02-03 — End: 2019-02-03
  Administered 2019-02-03 (×3): 0.5 mg via INTRAVENOUS
  Filled 2019-02-03 (×3): qty 0.5

## 2019-02-03 MED ORDER — MIDAZOLAM HCL 1 MG/ML IJ SOLN *I* WRAPPED
INTRAMUSCULAR | Status: AC
Start: 2019-02-03 — End: 2019-02-03
  Filled 2019-02-03: qty 2

## 2019-02-03 MED ORDER — DEXAMETHASONE SODIUM PHOSPHATE 4 MG/ML INJ SOLN *WRAPPED*
INTRAMUSCULAR | Status: DC | PRN
Start: 2019-02-03 — End: 2019-02-03
  Administered 2019-02-03: 4 mg via INTRAVENOUS

## 2019-02-03 MED ORDER — ROCURONIUM BROMIDE 10 MG/ML IV SOLN *WRAPPED*
Status: DC | PRN
Start: 2019-02-03 — End: 2019-02-03
  Administered 2019-02-03: 10 mg via INTRAVENOUS
  Administered 2019-02-03: 50 mg via INTRAVENOUS
  Administered 2019-02-03: 10 mg via INTRAVENOUS

## 2019-02-03 MED ORDER — PROPOFOL 10 MG/ML IV EMUL (INTERMITTENT DOSING) WRAPPED *I*
INTRAVENOUS | Status: DC | PRN
Start: 2019-02-03 — End: 2019-02-03
  Administered 2019-02-03: 200 mg via INTRAVENOUS

## 2019-02-03 MED ORDER — FENTANYL CITRATE 50 MCG/ML IJ SOLN *WRAPPED*
INTRAMUSCULAR | Status: DC | PRN
Start: 2019-02-03 — End: 2019-02-03
  Administered 2019-02-03: 100 ug via INTRAVENOUS

## 2019-02-03 MED ORDER — PROPOFOL 10 MG/ML IV EMUL (INTERMITTENT DOSING) WRAPPED *I*
INTRAVENOUS | Status: AC
Start: 2019-02-03 — End: 2019-02-03
  Filled 2019-02-03: qty 20

## 2019-02-03 MED ORDER — MIDAZOLAM HCL 1 MG/ML IJ SOLN *I* WRAPPED
INTRAMUSCULAR | Status: DC | PRN
Start: 2019-02-03 — End: 2019-02-03
  Administered 2019-02-03: 2 mg via INTRAVENOUS

## 2019-02-03 SURGICAL SUPPLY — 43 items
BAG SPEC RETRV 224ML W4XL6IN DIA10MM ENDOPCH RETRV (Supply) ×2 IMPLANT
BLADE SURG CARBON STEEL #11 STER (Supply) ×4 IMPLANT
BLANKET UPPER BODY TEMP THERAPY (Drape) ×4 IMPLANT
DEVICE LAP SUTURE GRASPER CLOSURE (Supply) IMPLANT
DRAPE SHEET 53X77 (Drape) ×2
DRAPE SUR 3 QTR W53XL77IN SMS POLYPR ~~LOC~~ (Drape) ×2 IMPLANT
DRAPE UTILITY W/TAPE 15X26 (Drape) ×8 IMPLANT
ENDO CLOSE USSC-TROCAR SITE CL (Supply) ×4 IMPLANT
FILTER NEPTUNE 4PORT MANIFOLD (Supply) ×4 IMPLANT
GLOVE BIOGEL PI MICRO IND UNDER SZ 6.5 LF (Glove) ×4 IMPLANT
GLOVE SURG BIOGEL PI ULTRATOUCH SZ 6.5 (Glove) ×33 IMPLANT
GOWN SURG XTR LONG 3XL (Gown) ×4 IMPLANT
HANDPIECE S/I 5MM X 32CM CANNULA DISP (Supply) ×4 IMPLANT
INSTRUMENT LIGASURE BLUNT TIP 5MM (Other) ×4 IMPLANT
LOOP LIGATURE VICRYL SZ 0 18IN (Suture) IMPLANT
LUBRICANT JELLY 2.7GM STER (Supply) IMPLANT
MANIPULATOR UTERINE CLEARVIEW 7CM TIP (Other) ×4 IMPLANT
NEEDLE PNEUMOPERITONEUM 120 (Needle) IMPLANT
PACK CUSTOM GYN ENDOSCOPY PK (Pack) ×4 IMPLANT
PACK PROCEDURE TRENGUARD 450 (Pack) ×4 IMPLANT
POUCH ENDO 10MM (Supply) ×2
SEALER LAP 37CM SHAFT 5MM NANO BLUNT JAW LIGASURE (Other) ×2 IMPLANT
SLEEVE COMP KNEE MED BLENDED (Supply) ×4 IMPLANT
SLEEVE XCEL 12MMX100MM (Other) IMPLANT
SLEEVE XCEL ENDOPATH 5 X 100MM (Other) ×9 IMPLANT
SOL CHG SCRUB 4 OZ (Solution) ×2
SOL H2O IRRIG STER 1000ML BTL (Solution) ×1
SOL IV DEXTROSE 5PCT 1000ML (Drug) IMPLANT
SOL SOD CHL IRRIG .9PCT 1000ML BAG (Solution) ×4 IMPLANT
SOL SOD CHL IRRIG 1000ML BTL (Solution) ×1
SOL SODIUM CHLORIDE IRRIG 1000ML BTL (Solution) ×3 IMPLANT
SOL WATER IRRIG STERILE 1000ML BTL (Solution) ×3 IMPLANT
SOLUTION SUR PREP 4OZ 4% CHG BTL EXIDINE (Solution) ×2 IMPLANT
SUTR MONOCRYL PLUS 4-0 18PS2 (Suture) ×8 IMPLANT
SUTR VICRYL CTD 0 SUTUPAK VIOLET (Suture) ×4 IMPLANT
SYRINGE  LUER LOCK  STERILE  60ML (Other)
SYRINGE 60ML  LUER LOCK  STERILE (Other) IMPLANT
TRAY PATIENT PREP (Tray) ×2
TRAY PREP SKIN INCL 8 DRY GZ PD TWO 6IN COT TIP APPL 2 STK SPNG (Tray) ×2 IMPLANT
TROCAR XCEL ENDOPATH BLADELESS 12X100MM (Other) ×4 IMPLANT
TROCAR XCEL ENDOPATH BLADELESS 5X100MM (Other) ×4 IMPLANT
TUBE Y FOLEY 6-N-1 CONN (Supply) ×4 IMPLANT
TUBING IRRIG Y TYPE BLADDER LF (Tubing) IMPLANT

## 2019-02-03 NOTE — Op Note (Signed)
OPERATIVE NOTE  Date: 02/03/2019  Attending surgeon: Verneda Skill, MD  Resident:Shariah Assad Ninfa Linden, MD PGY-3  Resident: Bernadene Bell, MD PGY-2    Pre-Op Diagnosis:  1. Left ovarian mass  2. Concern for ovarian torsion    Anesthesia Type: general  Medications received in OR: refer to Anesthesia records    Post-op Diagnosis:  1. Left pyosalpinx  2. Extensive adhesive disease    Procedures performed:  1. Exam under anesthesia  2. Left salpingectomy  3. Extensive lysis of adhesions    EBL: 20  mL   Packing: none  Drains: none  Temporary Implants: none  Fluid Totals: Input: 1900 mL LR    Output: 20 mL blood, 1900 mL urine  Specimens to Pathology:   Patient condition: good    Indications for Procedure: 33 y.o. female admitted with hematemsis and persistent left lower quadrant pain. She had been seen multiple times in the the ED for LLQ pain. Pelvic ultrasound showed an enlarged left ovary containing a complex mass which was poorly defined. She received cefoxitin, doxycycline, and flagyl due to concern for tubo-ovarian abscess. Her pain failed to improve and the decision was made to proceed with diagnostic laparoscopy.     Operative Findings:   Exam under anesthesia: Normal external genitalia, vagina. Cervix extremely difficult to visualize and anterior.     Laparoscopy: Multiple adhesions between the anterior abdominal wall and the omentum which were taken down. Enlarged left fallopian tube with adhesions tacking the fallopian tube to the anterior abdominal wall. Fallopian tube fimbriae noted to be inverted and inside the tube. Extensive adhesions from the fundus of the uterus all the wall along the anterior uterus. Normal-appearing ovaries. Normal right fallopian tube. No evidence of trocar injury.     Description of Procedure:  The patient was taken to the operating room, placed on the operating room table and a stop check was performed confirming the patient and procedure to be performed.   General anesthesia was obtained without difficulty.  An orogastric tube was placed to decompress the stomach. Patient positioning was verified for adequacy and safety. Examination under anesthesia revealed findings as above. Patient's perineum and abdomen were prepped and draped in the usual sterile fashion. The patient was then draped in the usual sterile fashion. The surgical pause was completed. A foley catheter was inserted under sterile conditions.  Anterior and posterior retractors were placed in the patient's vagina. There was difficulty visualizing the cervix and a sponge on a stick was placed in the vagina for manipulation of the uterus.    Attention was then turned to the abdominal portion of the procedure. The left upper quadrant was infiltrated with 4cc of 0.25% of bupivicaine. A 5 mm skin incision was made. A 55mm trocar was placed under direct visualization using the Visiport.  The abdomen was insufflated with CO2 gas on low flow with low pressures noted. Laparoscopic examination of the pelvis and abdomen revealed the findings as above. Next, a 5 mm incision was made in the right lower quadrant after infiltration with 5 cc of 0.25% bupivacaine and a 5 mm trocar was placed under video visualization.  A 5 mm incision was made in the left lower quadrant after infiltration with 5 cc of 0.25% bupivacaine and a third 5 mm trocar was placed under video visualization.  Inspection of the underlying viscera revealed no evidence of trocar injury. The patient was placed in Trendelenburg position.     The Ligasure was used to take down multiple midline omental  adhesions. The left fallopian tube was visualized and noted to be as described above. The Ligasure was used to take down adhesions from the fallopian tube to the ovary. The mesosalpinx was then taken down with the Ligasure. A salpingostomy was made in the dilated portion of the tube and drained for purulent fluid. The area of the fallopian tube which was  tacked to the anterior abdominal wall with dissected as close to the abdominal wall as possible but a small amount of fallopian tube was left adherent to the wall. The fallopian tube was transected at the left uterine horn. The umbilical port site was extended and replaced with a 10 mm port. A bag was introduced through the umbilical port, the tube was placed in the bag, and the left tube was removed through umbilical trocar and sent for pathology.    The abdomen was inspected with good hemostasis noted. The abdomen and pelvis was irrigated. The abdomen was desufflated to physiologic pressure, and hemostasis was again confirmed.  The 10 mm trocar site was closed with the endoclose device. The abdominal trocars were then removed and the skin was re-approximated with 4-0 Monocryl and dressed with Dermabond.     All instruments were removed from the vagina with good hemostasis from the tenaculum site and no transcervical bleeding noted. All sponge, needle and instrument counts were correct x2 at the end of the procedure. Anesthesia was reversed, the patient was extubated in the operating room and taken to the PACU in stable condition.      Maggie Schwalbe, MD PGY-3  Obstetrics & Gynecology  - [phone]

## 2019-02-03 NOTE — Progress Notes (Signed)
GYNECOLOGIC PROGRESS NOTE    LOS: 4 days     Subjective     Annette Preston reports feeling continued significant pain overnight. Rates pain at 6/10 after getting 10 mg of oxycodone. No further hematemesis. Has had continued nausea requiring antiemetics but denies any further vomiting. Current diet is NPO for possible surgery today.    Objective     Vitals:    02/02/19 1518 02/02/19 1946 02/02/19 2301 02/03/19 0324   BP: 106/60 111/64 134/84 114/71   BP Location: Left arm Left arm Left arm Left arm   Pulse: 93 88 94 80   Resp: _0 Temp: 36 C (96.8 F) 36.7 C (98.1 F) 36.6 C (97.9 F) 36.8 C (98.2 F)   TempSrc: Temporal Temporal Temporal Temporal   SpO2: 98% 98% 100% 97%   Weight:       Height:         General appearance: NAD, comfortable  Lungs: Breathing comfortably on RA  Heart: Warm and well perfused, normal rate  Abdomen: +BS, soft, non distended, moderate tenderness to palpation of SP area and left lower quadrant with voluntary guarding, no rebound tenderness  Extremities: No cyanosis, no edema    Labs     Blood counts  Recent Labs   Lab 02/03/19  0518 02/02/19  0352 02/01/19  0519   WBC 5.0 6.2 6.1   Seg Neut % 62.9 60.6 59.8   Neut # K/uL 3.2 3.7 3.7   Hemoglobin 8.1* 8.0* 7.3*   Hematocrit 28* 28* 26*   Platelets 413* 421* 846*     Metabolic panel  Recent Labs   Lab 01/29/19  1011 01/28/19  1229   Sodium 136 138   Potassium 4.0 4.8   Chloride 100 100   CO2 26 25   UN 5* 10   Creatinine 0.82 0.66   GFR,Caucasian 94 116   Glucose 98 102*   Calcium 8.4* 9.5      Hepatobiliary testing  Recent Labs   Lab 01/29/19  1011 01/28/19  1229   Total Protein 6.7 8.0*   Albumin 3.7 4.5   ALT 17 21   AST 15 14   Alk Phos 71 74   Bilirubin,Total 0.5 <0.2   Bilirubin,Direct  --  <0.2    Coagulopathy testing  Recent Labs   Lab 01/28/19  1229   Protime 12.1   aPTT 34.6   INR 1.0        Assessment & Plan     33 y.o. [id number] admitted 2/21with hematemesis (longstanding), anemia, fevers, leukocytosis, found on CTA to have a  complex left adnexal mass c/f TOA - measuring 4cm in greatest dimension.  Did have upper endoscopy with GI that did not identify active bleeding.  Clinically improved as has been afebrile for > 48h (Tmax/last 38.9 @ 1430 on 2/19), leukocytosis resolved, tolerating full liquid diet, however continues to report significant pain out of proportion to exam. Low suspicion for ruptured TOA as no peritoneal signs/stable abdominal exam and no vital sign derangements.     LLQ pain - TOA   - CT: L ov multilobulated cystic lesion 4.0 x 4.3cm, 2.5 x 2.4 cystic lesion, possible neoplasm  - TA [place]: The left ovary is enlarged and contains a complex mass that is poorly defined.  - GC/CT/trich negative   - cefoxy, doxy, flagyl (2/19- )   - Pain: oxy 5-10 prn, tylenol q6 scheduled  - NPO for possible laparoscopy today  Hematemesis (Currently resolved)  - Protonix 20m daily  - Hct stable at 27  - clear liquid diet   - appreciate GI recs    Dispo: Pending clinical improvement    AMaggie Schwalbe MD PGY-3  Obstetrics & Gynecology  - [phone]

## 2019-02-03 NOTE — Plan of Care (Signed)
Patient in significant discomfort throughout night. Pain medication administered around the clock. Nausea also prevalent; phenergan given x2. Providers aware. Made NPO at midnight for a procedure today.     Problem: Pain/Comfort  Goal: Patient's pain or discomfort is manageable  Outcome: Goal not met     Problem: Nutrition  Goal: Patient's nutritional status is maintained or improved  Outcome: Goal not met     Problem: Mobility  Goal: Patient's functional status is maintained or improved  Outcome: Progressing towards goal     Problem: Psychosocial  Goal: Demonstrates ability to cope with illness  Outcome: Progressing towards goal     Problem: Safety  Goal: Patient will remain free of falls  Outcome: Maintaining     Problem: Cognitive function  Goal: Cognitive function will be maintained or return to baseline  Outcome: Maintaining

## 2019-02-03 NOTE — Plan of Care (Signed)
Interdisciplinary Rounding Note   Annette Preston was discussed today during health team rounds.   Problems addressed:  Active Problems:    Abdominal pain, unspecified abdominal location    TOA (tubo-ovarian abscess)      The plan of care was reviewed with the following health team members:  Charge Nurse  Social Worker  Care Coordinator  Physical Therapist    [place] 02/03/2019 10:16 AM

## 2019-02-03 NOTE — Anesthesia Case Conclusion (Signed)
CASE CONCLUSION  Emergence  Actions:  Suctioned, soft bite block and extubated  Criteria Used for Airway Removal:  Adequate Tv & RR  Assessment:  Routine  Transport  Directly to: PACU  Airway:  Facemask  Oxygen Delivery:  10 lpm  Monitoring:  Pulse oximetry  Position:  Supine  Patient Condition on Handoff  Level of Consciousness:  Mildly sedated  Patient Condition:  Stable  Handoff Report to:  RN

## 2019-02-03 NOTE — Plan of Care (Signed)
Problem: Alteration in respiratory function  Goal: Sa02 > 92% or at pre-op level  Outcome: Adequate for discharge     Problem: Alteration in hydration/electrolytes  Goal: Patient's fluid/electrolyte balance optimized w/ minimal N/V  Description  Patients fluid/electrolyte balance optimized with minimal nausea or vomiting  Outcome: Adequate for discharge     Problem: Alteration of skin integrity related to surgical procedure  Goal: Maintain skin integrity with incision line approximated  Outcome: Adequate for discharge     Problem: Alteration in comfort related to surgical procedure  Goal: Pain is relieved or minimized  Description  Pain is relieved or minimized to equal or less than 5 on a 0-10 scale  Outcome: Adequate for discharge     Problem: Alteration in level of consciousness  Goal: Patient oriented or at baseline  Outcome: Adequate for discharge     Problem: Anxiety related surgical procedure/anesthesia and findings  Goal: Anxiety under control or perceived as acceptable to patient  Outcome: Adequate for discharge     Problem: Impaired cardiovascular/circulatory function  Goal: BP, Pulse within 20% of baseline and Cardiac rhythm stable  Outcome: Completed or Resolved

## 2019-02-03 NOTE — Progress Notes (Signed)
OB/GYN Attending Note    Up to see patient after surgery.  She is resting comfortably in bed.  Reviewed operative findings and patient and husband shown images from surgery. We did not find anything intra-abdominally that could be related to her persistent hematemesis.    Abdomen soft, obese, minimally tender, l/s incisions c/d/i x4.      Discussed that her pain medication requirements should decrease significantly, now that she has had definitive surgical management of TOA.  She will be OK for discharge home in the morning.  Should require minimal, if any, narcotic pain medication upon discharge.  Will plan to continue IV antibiotics until the AM.  She has had definitive surgical management but would still recommend completing total 14d course of doxy / flagyl as outpatient.       Laruth Bouchard, MD  OB/GYN Attending  Pager [phone]

## 2019-02-03 NOTE — Progress Notes (Signed)
Annette Preston stable for transfer to E5 , handoff given to [place] .

## 2019-02-03 NOTE — Anesthesia Preprocedure Evaluation (Signed)
Anesthesia Pre-operative History and Physical for Annette Preston  .  .  .  Anesthesia Evaluation Information Source: patient, family, records     ANESTHESIA HISTORY  Pertinent(-):  No History of anesthetic complications or Family hx of anesthetic complications    GENERAL    + Obesity          central, upper body    HEENT     Denies HEENT issues PULMONARY     Denies pulmonary issues  Pertinent(-):  No asthma or recent URI    CARDIOVASCULAR  Good(4+METs) Exercise Tolerance  Pertinent(-):  No hypertension, past MI, angina or dysrhythmias    GI/HEPATIC/RENAL  Last PO Intake: >8hr before procedure and >2hr before procedure (clears)  Pertinent(-):  No GERD   Comment: Presented to ED this admission with hematemesis, now s/p EGD.  Nausea improved this admission.   NEURO/PSYCH     Denies neuro/psych issues    ENDO/OTHER     Denies endo issues  Pertinent(-):  No diabetes mellitus, thyroid disease    HEMATOLOGIC     Denies hematologic issues         Physical Exam    Airway            Mouth opening: normal            Mallampati: III            TM distance (cm): 4            Neck ROM: full            Airway Impression: easy  Dental        Cardiovascular  Normal Exam           Rhythm: regular         Pulmonary   Normal Exam    breath sounds clear to auscultation    Mental Status     oriented to person, place and time         ________________________________________________________________________  PLAN  ASA Score  3  Anesthetic Plan general     Induction (routine IV) General Anesthesia/Sedation Maintenance Plan (inhaled agents and neuromuscular blockade);  Airway Manipulation (direct laryngoscopy); Airway (cuffed ETT); Line ( use current access); Monitoring (standard ASA); Positioning (supine); PONV Plan (dexamethasone and ondansetron); Pain (per surgical team); PostOp (PACU)    Informed Consent     Risks:         Risks discussed were commensurate with the plan listed above with the following specific points: N/V, aspiration and  sore throat, Damage to: eyes, nerves and teeth, allergic Rx, unexpected serious injury and awareness.    Anesthetic Consent:         Anesthetic plan (and risks as noted above) were discussed with patient and spouse    Plan also discussed with team members including:       surgeon    Attending Attestation:  As the primary attending anesthesiologist, I attest that the patient or proxy understands and accepts the risks and benefits of the anesthesia plan. I also attest that I have personally performed a pre-anesthetic examination and evaluation, and prescribed the anesthetic plan for this particular location within 48 hours prior to the anesthetic as documented. Durene Romans, MD 2:31 PM

## 2019-02-03 NOTE — Anesthesia Procedure Notes (Signed)
---------------------------------------------------------------------------------------------------------------------------------------    AIRWAY   GENERAL INFORMATION AND STAFF    Patient location during procedure: OR       Date of Procedure: 02/03/2019 3:20 PM  CONDITION PRIOR TO MANIPULATION     Current Airway/Neck Condition:  Normal        For more airway physical exam details, see Anesthesia PreOp Evaluation  AIRWAY METHOD     Patient Position:  Sniffing    Preoxygenated: yes      Induction: IV    Mask Difficulty Assessment:  1 - vent by mask      Mask NMB: 1 - vent by mask      Technique Used for Successful ETT Placement:  Direct laryngoscopy    Blade Type:  Macintosh    Laryngoscope Blade/Video laryngoscope Blade Size:  3    Cormack-Lehane Classification:  Grade IIb - view of arytenoids or posterior of glottis only    Placement Verified by: capnometry, auscultation and equal breath sounds      Number of Attempts at Approach:  1  FINAL AIRWAY DETAILS    Final Airway Type:  Endotracheal airway    Adjunct Airway: soft bite block    Final Endotracheal Airway:  ETT      Cuffed: cuffed    Insertion Site:  Oral  ----------------------------------------------------------------------------------------------------------------------------------------

## 2019-02-04 ENCOUNTER — Encounter: Payer: Self-pay | Admitting: Obstetrics and Gynecology

## 2019-02-04 LAB — CBC AND DIFFERENTIAL
Baso # K/uL: 0 10*3/uL (ref 0.0–0.1)
Basophil %: 0.1 %
Eos # K/uL: 0 10*3/uL (ref 0.0–0.4)
Eosinophil %: 0 %
Hematocrit: 27 % — ABNORMAL LOW (ref 34–45)
Hemoglobin: 8.1 g/dL — ABNORMAL LOW (ref 11.2–15.7)
IMM Granulocytes #: 0 10*3/uL (ref 0.0–0.0)
IMM Granulocytes: 0.4 %
Lymph # K/uL: 0.8 10*3/uL — ABNORMAL LOW (ref 1.2–3.7)
Lymphocyte %: 8.8 %
MCH: 24 pg — ABNORMAL LOW (ref 26–32)
MCHC: 30 g/dL — ABNORMAL LOW (ref 32–36)
MCV: 80 fL (ref 79–95)
Mono # K/uL: 0.2 10*3/uL (ref 0.2–0.9)
Monocyte %: 2.6 %
Neut # K/uL: 8.2 10*3/uL — ABNORMAL HIGH (ref 1.6–6.1)
Nucl RBC # K/uL: 0 10*3/uL (ref 0.0–0.0)
Nucl RBC %: 0 /100 WBC (ref 0.0–0.2)
Platelets: 483 10*3/uL — ABNORMAL HIGH (ref 160–370)
RBC: 3.4 MIL/uL — ABNORMAL LOW (ref 3.9–5.2)
RDW: 17.2 % — ABNORMAL HIGH (ref 11.7–14.4)
Seg Neut %: 88.1 %
WBC: 9.3 10*3/uL (ref 4.0–10.0)

## 2019-02-04 LAB — BLOOD CULTURE: Bacterial Blood Culture: 0

## 2019-02-04 MED ORDER — DOCUSATE SODIUM 100 MG PO CAPS *I*
100.0000 mg | ORAL_CAPSULE | Freq: Two times a day (BID) | ORAL | 5 refills | Status: DC | PRN
Start: 2019-02-04 — End: 2019-02-13

## 2019-02-04 MED ORDER — OXYCODONE HCL 5 MG PO TABS *I*
5.0000 mg | ORAL_TABLET | ORAL | Status: DC | PRN
Start: 2019-02-04 — End: 2019-02-04

## 2019-02-04 MED ORDER — OXYCODONE HCL 5 MG PO TABS *I*
5.0000 mg | ORAL_TABLET | ORAL | 0 refills | Status: DC | PRN
Start: 2019-02-04 — End: 2019-02-13

## 2019-02-04 MED ORDER — OXYCODONE HCL 10 MG PO TABS *I*
10.0000 mg | ORAL_TABLET | ORAL | Status: DC | PRN
Start: 2019-02-04 — End: 2019-02-04
  Administered 2019-02-04: 10 mg via ORAL
  Filled 2019-02-04: qty 1

## 2019-02-04 MED ORDER — DOXYCYCLINE HYCLATE 100 MG PO TABS *I*
100.0000 mg | ORAL_TABLET | Freq: Two times a day (BID) | ORAL | 0 refills | Status: DC
Start: 2019-02-04 — End: 2019-02-13

## 2019-02-04 MED ORDER — METRONIDAZOLE 500 MG PO TABS *I*
500.0000 mg | ORAL_TABLET | Freq: Two times a day (BID) | ORAL | 0 refills | Status: DC
Start: 2019-02-04 — End: 2019-02-13

## 2019-02-04 NOTE — Plan of Care (Signed)
Interdisciplinary Rounding Note   Alaze Garverick was discussed today during health team rounds.   Problems addressed:  Principal Problem:    s/p laparoscopic LS with extensive LOA  Active Problems:    Abdominal pain, unspecified abdominal location    TOA (tubo-ovarian abscess)      The plan of care was reviewed with the following health team members:  Charge Nurse  Social Worker  Palliative Care  Care Coordinator  Physical Therapist      Carolin Sicks, RN 02/04/2019 10:10 AM

## 2019-02-04 NOTE — Discharge Summary (Signed)
Name: Carletta Feasel MRN: [id number] DOB: [birth year]     Admit Date: 01/28/2019   Date of Discharge: 02/04/2019     Patient was accepted for discharge to   Home or [place] [1]           Discharge Attending Physician: Thompsoncarrillo, Raquel Sarna, MD      Hospitalization Summary    CONCISE NARRATIVE: 33 y.o. female admitted with hematemsis and persistent left lower quadrant pain. She had been seen multiple times in the the ED for LLQ pain. Pelvic ultrasound showed an enlarged left ovary containing a complex mass which was poorly defined. She received cefoxitin, doxycycline, and flagyl due to concern for tubo-ovarian abscess. Her pain failed to improve and the decision was made to proceed with diagnostic laparoscopy. On 2/24/202 she underwent a diagnostic laparoscopy, left salpingectomy, extensive lysis of adhesions with removal of a vary dilated left fallopian tube. She recovered well post-operatively and was ultimately discharged home on PO antibiotics and PO pain medication on post-op day #1.         OR PROCEDURE: 02/03/2019:: diagnostic laparoscopy, left salpingectomy, extensive lysis of adhesions                   Signed: Conchita Paris, MD  On: 02/04/2019  at: 10:26 AM

## 2019-02-04 NOTE — Progress Notes (Signed)
Patient's pain well controlled, all VSS, ambulating independently, tolerating all food and fluids well, voiding appropriately. All new medications reviewed with patient. PIV removed. Patient wheeled out via wheelchair for home with husband.

## 2019-02-04 NOTE — Progress Notes (Signed)
GYNECOLOGIC PROGRESS NOTE    LOS: 5 days     Subjective     Annette Preston reports feeling better this AM. Still sore but overall better. Reports some mild nausea but denies emesis. Has ambulated and voided.     Objective     Vitals:    02/03/19 2002 02/03/19 2234 02/03/19 2350 02/04/19 0522   BP: 132/88 124/89 133/89 (!) 134/94   BP Location: Left arm Left arm Left arm Left arm   Pulse: 93 102 104 94   Resp: _0 Temp: 36.7 C (98.1 F) 36.4 C (97.5 F) 37.1 C (98.8 F) 36.6 C (97.9 F)   TempSrc: Temporal Temporal Temporal Temporal   SpO2: 95% 96% 97% 95%   Weight:       Height:         General appearance: NAD, comfortable  Lungs: clear to auscultation bilaterally  Heart: RRR  Abdomen: +BS, soft, non distended, mildly tender  Extremities: No cyanosis, no edema    Labs     Blood counts  Recent Labs   Lab 02/04/19  0532 02/03/19  0518 02/02/19  0352   WBC 9.3 5.0 6.2   Seg Neut % 88.1 62.9 60.6   Neut # K/uL 8.2* 3.2 3.7   Hemoglobin 8.1* 8.1* 8.0*   Hematocrit 27* 28* 28*   Platelets 483* 413* 102*     Metabolic panel  Recent Labs   Lab 01/29/19  1011 01/28/19  1229   Sodium 136 138   Potassium 4.0 4.8   Chloride 100 100   CO2 26 25   UN 5* 10   Creatinine 0.82 0.66   GFR,Caucasian 94 116   Glucose 98 102*   Calcium 8.4* 9.5      Hepatobiliary testing  Recent Labs   Lab 01/29/19  1011 01/28/19  1229   Total Protein 6.7 8.0*   Albumin 3.7 4.5   ALT 17 21   AST 15 14   Alk Phos 71 74   Bilirubin,Total 0.5 <0.2   Bilirubin,Direct  --  <0.2    Coagulopathy testing  Recent Labs   Lab 01/28/19  1229   Protime 12.1   aPTT 34.6   INR 1.0        Assessment & Plan     33 y.o. [id number] POD#1 s/p laparoscopic left salpingectomy and extensive LOA for left pyosalpinx. Transitioned to oral antibiotics last night and has remained afebrile.     Post-op  - Pain: oxy, tyl  - EBL 20 cc    TOA  - cefoxy, doxy, flagyl (2/19-2/22) -> PO flagyl, doxy (2/22 - )   - GC/CT/trich negative   - Tmax/last 38.9 @ 1430 on 2/19  - Leukocytosis  resolved  - blood cultures NGTD     Hematemesis  - Protonix 53m daily  - Hct stable at 26  - ADAT  - follow up with GI in 3-4 weeks for possible scope    Dispo: Pending clinical improvement    AMaggie Schwalbe MD PGY-3  Obstetrics & Gynecology  - [phone]

## 2019-02-04 NOTE — Discharge Instructions (Signed)
DISCHARGE INSTRUCTIONS  Laparoscopy    01/28/2019     You have received sedative medication and/or general anesthesia which may make you drowsy for as long as 24 hours:     A)  DO NOT drive or operate any machinery for 24 hours     B)  DO NOT drink alcoholic beverages for 12 hours     C)  DO NOT make major decisions, sign contracts, etc for 24 hours  Please continue to adhere to these precautions if you are taking narcotic medication.    Discomfort after surgery:  - It is normal to have mild to moderate abdominal cramping or gas pains for a few days to a few weeks  - It is normal to have mild pain along your incision  - It is normal to have shoulder or neck pain (caused by gas in your abdomen)  - Ambulating or a heating pad set on low may help relieve gas pains  - Use acetaminophen (Tylenol) or ibuprofen for pain unless you are allergic to these drugs    Surgery site care after surgery  - It is normal to have some vaginal bleeding  - Keep incision site clean, it does not need to be covered    Activity after surgery  - You should rest the day of the procedure  - You can resume normal activities the day following your procedure  - Shower: You may shower  - Driving: you may drive after 24 hours  - Exercise: Do not resume vigorous exercise for 1 week  - Intercourse: Nothing in the vagina (no tampons, douching or intercourse) 2-4 weeks.    Diet after surgery  - You may feel nauseated from the surgery or anesthesia.  Advance diet as tolerated.    Medications  See medication reconciliation sheet    When to call your doctor:  - fever greater than 100 degrees F and/or chills  - Nausea and vomiting  - inability to urinate  - severe cramping or abdominal pain not relieved by acetaminophen (Tylenol) or ibuprofen  - foul smelling discharge  - heavy vaginal bleeding (soaking through 1 pad every 1-2 hours)  - With any other concerns or questions  - If you have an emergency and are unable to contact your doctor, you may call the  Emergency Department.

## 2019-02-05 LAB — SURGICAL PATHOLOGY

## 2019-02-05 NOTE — Anesthesia Postprocedure Evaluation (Signed)
Anesthesia Post-Op Note    Patient: Annette Preston    Procedure(s) Performed:  Procedure Summary  Date:  02/03/2019 Anesthesia Start: 02/03/2019  2:43 PM Anesthesia Stop: 02/03/2019  5:08 PM Room / Location:  [id number] / [place]   Procedure(s):  LAPAROSCOPY, DIAGNOSTIC, EXTENSIVE LYSIS OF ADHESIONS  SALPINGECTOMY, LAPAROSCOPIC LEFT Diagnosis:  Ovarian mass, left [N83.8]  Ovarian torsion [N83.519] Surgeon(s):  Thompsoncarrillo, Raquel Sarna, MD  Alphonzo Grieve, MD  Conchita Paris, MD Attending Anesthesiologist:  Durene Romans, MD         Recovery Vitals  BP: 128/88 (02/04/2019  8:00 AM)  Heart Rate: 94 (02/04/2019  8:00 AM)  Heart Rate (via Pulse Ox): 74 (02/03/2019  5:45 PM)  Resp: 16 (02/04/2019  8:00 AM)  Temp: 36.2 C (97.2 F) (02/04/2019  8:00 AM)  SpO2: 98 % (02/04/2019  8:00 AM)  O2 Flow Rate: 2 L/min (02/03/2019 10:34 PM)   0-10 Scale: 8 (02/04/2019 10:34 AM)    Anesthesia type:  general  Complications Noted During Procedure or in PACU:  None   Comment:    Patient Location:  [facility] Floor (discharged prior to my arrival; per chart and per team:)  Level of Consciousness:    Recovered to baseline  Patient Participation:     Able to participate  Temperature Status:    Normothermic  Oxygen Saturation:    Within patient's normal range  Cardiac Status:   Within patient's normal range  Fluid Status:    Stable  Airway Patency:     Yes  Pulmonary Status:    Baseline  Pain Management:    Adequate analgesia and satisfactory to patient  Nausea and Vomiting:    Controlled    Post Op Assessment:    Tolerated procedure well"   Attending Attestation:  All indicated post anesthesia care provided       -

## 2019-02-08 ENCOUNTER — Encounter: Payer: Self-pay | Admitting: Student in an Organized Health Care Education/Training Program

## 2019-02-08 ENCOUNTER — Emergency Department: Payer: Medicaid Other

## 2019-02-08 ENCOUNTER — Emergency Department
Admission: EM | Admit: 2019-02-08 | Discharge: 2019-02-08 | Disposition: A | Discharge status: Home / Self Care | Payer: Medicaid Other | Source: Ambulatory Visit | Attending: Emergency Medicine | Admitting: Emergency Medicine

## 2019-02-08 DIAGNOSIS — R1032 Left lower quadrant pain: Secondary | ICD-10-CM | POA: Insufficient documentation

## 2019-02-08 DIAGNOSIS — N838 Other noninflammatory disorders of ovary, fallopian tube and broad ligament: Secondary | ICD-10-CM

## 2019-02-08 DIAGNOSIS — R1033 Periumbilical pain: Secondary | ICD-10-CM

## 2019-02-08 DIAGNOSIS — R109 Unspecified abdominal pain: Secondary | ICD-10-CM

## 2019-02-08 DIAGNOSIS — N7093 Salpingitis and oophoritis, unspecified: Secondary | ICD-10-CM

## 2019-02-08 DIAGNOSIS — R110 Nausea: Secondary | ICD-10-CM

## 2019-02-08 DIAGNOSIS — R509 Fever, unspecified: Secondary | ICD-10-CM

## 2019-02-08 LAB — POCT URINALYSIS DIPSTICK
Glucose,UA POCT: NORMAL mg/dL
Leuk Esterase,UA POCT: 1 — AB
Lot #: 40387304
Nitrite,UA POCT: NEGATIVE
PH,UA POCT: 6 (ref 5–8)
Protein,UA POCT: NEGATIVE mg/dL

## 2019-02-08 LAB — CBC AND DIFFERENTIAL
Baso # K/uL: 0 10*3/uL (ref 0.0–0.1)
Basophil %: 0.1 %
Eos # K/uL: 0.1 10*3/uL (ref 0.0–0.4)
Eosinophil %: 0.5 %
Hematocrit: 30 % — ABNORMAL LOW (ref 34–45)
Hemoglobin: 8.8 g/dL — ABNORMAL LOW (ref 11.2–15.7)
IMM Granulocytes #: 0 10*3/uL (ref 0.0–0.0)
IMM Granulocytes: 0.4 %
Lymph # K/uL: 1.9 10*3/uL (ref 1.2–3.7)
Lymphocyte %: 19.6 %
MCH: 23 pg — ABNORMAL LOW (ref 26–32)
MCHC: 29 g/dL — ABNORMAL LOW (ref 32–36)
MCV: 80 fL (ref 79–95)
Mono # K/uL: 0.5 10*3/uL (ref 0.2–0.9)
Monocyte %: 5.1 %
Neut # K/uL: 7.3 10*3/uL — ABNORMAL HIGH (ref 1.6–6.1)
Nucl RBC # K/uL: 0 10*3/uL (ref 0.0–0.0)
Nucl RBC %: 0 /100 WBC (ref 0.0–0.2)
Platelets: 518 10*3/uL — ABNORMAL HIGH (ref 160–370)
RBC: 3.8 MIL/uL — ABNORMAL LOW (ref 3.9–5.2)
RDW: 17.7 % — ABNORMAL HIGH (ref 11.7–14.4)
Seg Neut %: 74.3 %
WBC: 9.9 10*3/uL (ref 4.0–10.0)

## 2019-02-08 LAB — BASIC METABOLIC PANEL
Anion Gap: 13 (ref 7–16)
CO2: 23 mmol/L (ref 20–28)
Calcium: 9.3 mg/dL (ref 8.8–10.2)
Chloride: 105 mmol/L (ref 96–108)
Creatinine: 0.64 mg/dL (ref 0.51–0.95)
GFR,Black: 136 *
GFR,Caucasian: 118 *
Glucose: 93 mg/dL (ref 60–99)
Lab: 10 mg/dL (ref 6–20)
Potassium: 4.5 mmol/L (ref 3.3–5.1)
Sodium: 141 mmol/L (ref 133–145)

## 2019-02-08 MED ORDER — SODIUM CHLORIDE 0.9 % 100 ML IV SOLN *I*
25.0000 mg | Freq: Once | INTRAVENOUS | Status: AC
Start: 2019-02-08 — End: 2019-02-08
  Administered 2019-02-08: 25 mg via INTRAVENOUS
  Filled 2019-02-08: qty 1

## 2019-02-08 MED ORDER — PROMETHAZINE HCL 12.5 MG PO TABS *I*
12.5000 mg | ORAL_TABLET | Freq: Four times a day (QID) | ORAL | 0 refills | Status: DC | PRN
Start: 2019-02-08 — End: 2019-02-13

## 2019-02-08 MED ORDER — ONDANSETRON HCL 2 MG/ML IV SOLN *I*
4.0000 mg | Freq: Once | INTRAMUSCULAR | Status: AC
Start: 2019-02-08 — End: 2019-02-08
  Administered 2019-02-08: 4 mg via INTRAVENOUS
  Filled 2019-02-08: qty 2

## 2019-02-08 MED ORDER — STERILE WATER FOR IRRIGATION IR SOLN *I*
900.0000 mL | Freq: Once | Status: AC
Start: 2019-02-08 — End: 2019-02-08
  Administered 2019-02-08: 900 mL via ORAL

## 2019-02-08 MED ORDER — OXYCODONE HCL 5 MG PO TABS *I*
5.0000 mg | ORAL_TABLET | Freq: Four times a day (QID) | ORAL | 0 refills | Status: DC | PRN
Start: 2019-02-08 — End: 2019-02-13

## 2019-02-08 MED ORDER — SODIUM CHLORIDE 0.9 % IV SOLN WRAPPED *I*
125.0000 mL/h | Status: DC
Start: 2019-02-08 — End: 2019-02-09
  Administered 2019-02-08: 125 mL/h via INTRAVENOUS

## 2019-02-08 MED ORDER — DEXTROSE 5 % FLUSH FOR PUMPS *I*
0.0000 mL/h | INTRAVENOUS | Status: DC | PRN
Start: 2019-02-08 — End: 2019-02-09

## 2019-02-08 MED ORDER — SODIUM CHLORIDE 0.9 % FLUSH FOR PUMPS *I*
0.0000 mL/h | INTRAVENOUS | Status: DC | PRN
Start: 2019-02-08 — End: 2019-02-09

## 2019-02-08 MED ORDER — MORPHINE SULFATE 4 MG/ML IV SOLN *WRAPPED*
4.0000 mg | INTRAVENOUS | Status: AC | PRN
Start: 2019-02-08 — End: 2019-02-08
  Administered 2019-02-08 (×3): 4 mg via INTRAVENOUS
  Filled 2019-02-08 (×3): qty 1

## 2019-02-08 MED ORDER — IOHEXOL 350 MG/ML (OMNIPAQUE) IV SOLN *I*
1.0000 mL | Freq: Once | INTRAVENOUS | Status: AC
Start: 2019-02-08 — End: 2019-02-08
  Administered 2019-02-08: 146 mL via INTRAVENOUS

## 2019-02-08 NOTE — ED Notes (Signed)
Pt states she had gyn tube 6 days ago, she states 2 days post op w increased  LLQ pain, fevers, n/v. She states she has not been able to reach her surgeon.

## 2019-02-08 NOTE — ED Provider Progress Notes (Signed)
ED Provider Progress Note    Patient feeling better. She is tolerating a boxed lunch. ObGyn colleagues evaluated. We will be discharging with additional analgesia and antiemetics at home. Patient and family updated and agree with this plan.         Sinclair Grooms, MD, 02/08/2019, 10:15 PM     Sinclair Grooms, MD  02/08/19 2216

## 2019-02-08 NOTE — ED Provider Notes (Signed)
History     Chief Complaint   Patient presents with    Abdominal Pain    Post-op Problem     Per pt with lower abd pain. She had a salpingectomy by Dr. Dennison Nancy on Wednesday, for presumed TOA.   She reports no change in her pain since surgery.   Fever and chills.  Nausea, no vomiting.       History provided by:  Patient      Medical/Surgical/Family History     Past Medical History:   Diagnosis Date    Anemia     Depression     GI bleed     hemetesis        Patient Active Problem List   Diagnosis Code    Anemia D64.9    Abdominal pain, unspecified abdominal location R10.9    TOA (tubo-ovarian abscess) N70.93    s/p laparoscopic LS with extensive LOA Z98.890            Past Surgical History:   Procedure Laterality Date    CESAREAN SECTION, UNSPECIFIED      CHOLECYSTECTOMY      GALLBLADDER SURGERY      PR ESOPHAGOGASTRODUODENOSCOPY TRANSORAL DIAGNOSTIC N/A 01/29/2019    Procedure: EGD (ESOPHAGOGASTRODUODENOSCOPY);  Surgeon: Maryann Alar, MD;  Location: [facility];  Service: GI    PR LAP,DIAGNOSTIC ABDOMEN N/A 02/03/2019    Procedure: LAPAROSCOPY, DIAGNOSTIC, EXTENSIVE LYSIS OF ADHESIONS;  Surgeon: Thompsoncarrillo, Raquel Sarna, MD;  Location: [facility];  Service: OBGYN     Family History   Problem Relation Age of Onset    Colon cancer Paternal Grandmother     Breast cancer Maternal Grandmother 31          Social History     Tobacco Use    Smoking status: Never Smoker    Smokeless tobacco: Never Used   Substance Use Topics    Alcohol use: Not Currently     Frequency: Never    Drug use: Never     Living Situation     Questions Responses    Patient lives with Family    Homeless     Caregiver for other family member     External Services     Employment Employed    Domestic Violence Risk                 Review of Systems   Review of Systems   Constitutional: Positive for chills and fever.   HENT: Negative for congestion.    Eyes: Negative for visual disturbance.   Respiratory: Negative for  shortness of breath.    Cardiovascular: Negative for chest pain.   Gastrointestinal: Positive for abdominal pain and nausea. Negative for constipation, diarrhea and vomiting.   Genitourinary: Negative for dysuria.   Skin: Negative for color change.   Neurological: Negative for dizziness, light-headedness and headaches.   Psychiatric/Behavioral: Negative for confusion.       Physical Exam     Triage Vitals  Triage Start: Start, (02/08/19 1350)   First Recorded BP: (!) 143/94, Resp: 16, Temp: 35.4 C (95.7 F), Temp src: TEMPORAL Oxygen Therapy SpO2: 100 %, Oximetry Source: Rt Hand, O2 Device: None (Room air), Heart Rate: 99, (02/08/19 1353)  .  First Pain Reported  0-10 Scale: 10, Pain Location/Orientation: Abdomen, (02/08/19 1353)       Physical Exam  Vitals signs and nursing note reviewed.   Constitutional:       Appearance: She is well-developed.   HENT:  Head: Normocephalic and atraumatic.      Right Ear: External ear normal.      Left Ear: External ear normal.   Eyes:      Conjunctiva/sclera: Conjunctivae normal.      Pupils: Pupils are equal, round, and reactive to light.   Neck:      Musculoskeletal: Normal range of motion.   Cardiovascular:      Rate and Rhythm: Normal rate and regular rhythm.      Heart sounds: Normal heart sounds.   Pulmonary:      Effort: Pulmonary effort is normal. No respiratory distress.      Breath sounds: Normal breath sounds. No wheezing or rales.   Abdominal:      General: Bowel sounds are normal. There is no distension.      Palpations: Abdomen is soft.      Tenderness: There is abdominal tenderness in the periumbilical area and suprapubic area.   Musculoskeletal: Normal range of motion.   Skin:     General: Skin is warm and dry.   Neurological:      Mental Status: She is alert and oriented to person, place, and time.   Psychiatric:         Behavior: Behavior normal.         Thought Content: Thought content normal.         Judgment: Judgment normal.         Medical Decision  Making   Patient seen by me on:  02/08/2019    Assessment:  Annette Preston is a 33 y.o. female  With abd pain     Differential diagnosis:  Abscess,  Seroma  Ileus  Electrolyte disorder  Infection     Plan:  Labs: cbc,chem 7, ua  Meds: IV fluids, iv analgesia and antiemetics as needed.     Imaging: CT a/p   Dispo: Gyn consult.  Dispo pending clinical course in the ED.         Independent review of: Existing labs, CT scans, chart/prior records              [place], DO          Hale Bogus, DO  02/08/19 1806

## 2019-02-08 NOTE — Provider Consult (Signed)
GYNECOLOGY CONSULTATION HISTORY & PHYSICAL      Consult Requested By: Dr. Delena Bali from ED  Consult Question: post operative pain    HPI: Annette Preston is a 33 y.o. [id number] female who was recently admitted with hematemsis and persistent left lower quadrant pain. She had been seen multiple times in the the ED for LLQ pain. Pelvic ultrasound showed an enlarged left ovary containing a complex mass which was poorly defined. She received cefoxitin, doxycycline, and flagyl due to concern for tubo-ovarian abscess. Her pain failed to improve and the decision was made to proceed with diagnostic laparoscopy. On 2/24/202 she underwent a diagnostic laparoscopy, left salpingectomy, extensive lysis of adhesions with removal of a vary dilated left fallopian tube. She was then discharged home in stable condition on PO flagyl, doxy, tylenol, oxy.     Patient reports she has been taking tylenol q4 hours and had finished her oxycodone. She reports a temperature of 100.3 at home. She has had nausea and emesis with the last episode being this AM. She has been passing gas and had a bowel movement today. She reports no improvement in pain since before the surgery.      Past Medical History     Past Medical History:   Diagnosis Date    Anemia     Depression     GI bleed     hemetesis       Gynecologic History   Menstrual  Patient's last menstrual period was 01/15/2019.      Past Surgical History     Past Surgical History:   Procedure Laterality Date    CESAREAN SECTION, UNSPECIFIED      CHOLECYSTECTOMY      GALLBLADDER SURGERY      PR ESOPHAGOGASTRODUODENOSCOPY TRANSORAL DIAGNOSTIC N/A 01/29/2019    Procedure: EGD (ESOPHAGOGASTRODUODENOSCOPY);  Surgeon: Maryann Alar, MD;  Location: [facility];  Service: GI    PR LAP,DIAGNOSTIC ABDOMEN N/A 02/03/2019    Procedure: LAPAROSCOPY, DIAGNOSTIC, EXTENSIVE LYSIS OF ADHESIONS;  Surgeon: Anderson Malta, MD;  Location: [facility];  Service: OBGYN       Obstetric History      OB History   Gravida Para Term Preterm AB Living   5 5 5     5    SAB TAB Ectopic Multiple Live Births           5      # Outcome Date GA Lbr Len/2nd Weight Sex Delivery Anes PTL Lv   5 Term 2015           4 Term 2013     CS-Unspec      3 Term 2010     CS-Unspec      2 Term 2009     CS-Unspec      1 Term      CS-Unspec          Social History     Social History     Tobacco Use    Smoking status: Never Smoker    Smokeless tobacco: Never Used   Substance Use Topics    Alcohol use: Not Currently     Frequency: Never    Drug use: Never       Family History     Family History            Breast cancer Maternal Grandmother    Colon cancer Paternal Grandmother          Allergies   No Known Allergies (drug, envir,  food or latex)    Current Home Medications   Medication reconciliation performed at the time of evaluation. See Med Rec portion of patient's chart.     Review of Systems  All other ROS negative unless otherwise noted in HPI.       Physical Exam     Vitals:    02/08/19 1353   BP: (!) 143/94   BP Location: Left arm   Pulse: 99   Resp: 16   Temp: 35.4 C (95.7 F)   TempSrc: Temporal   SpO2: 100%   Weight: 98.4 kg (217 lb)   Height: 1.6 m (5\' 3" )       General: well developed and well nourished  Mental: alert and oriented  HEENT: Normocephalic, atraumatic  Cardiovascular: Regular rate and rhythm with no murmurs  Respiratory: Clear to auscultation bilaterally, normal respiratory effort  Abdomen: non-distended, normal bowel sounds, tender in LLQ only with voluntary guarding. No rebound, otherwise non-tender, incisions are well healing. Bruising around umbilical incision  Extremities/Skin: Minimal edema of pedal and pretibial (1+)        Labs   All labs reviewed and notable for:  WBC 9.9  HCT 30      Imaging       CT Abd/pelvis: "1. Small, structure with central low-attenuation and mild perifocal stranding in the left adnexa.. This may represent a physiologic adnexal cyst or an abscess if recent surgery involved  removal of the ovary."    Assessment & Recommendations     Annette Preston is a 33 y.o. [id number] female who presents with post-operative pain and nausea/vomiting. She underwent diagnostic lap, left salpingectomy and lysis on adhesions on 02/03/19 with concern for TOA. She was discharged home on POD#1 with PO flagyl, doxy as well as oxy and tylenol. She came to the ED due to uncontrolled pain as well as nausea. She received pain and nausea medications in the ED with good effect. Abdominal exam is benign, no white count, normal vital signs. CT scan showed post-operative changes. If able to tolerate PO will go home with oxycodone, phenergan, tylenol. Will continue PO antibiotics.       Seen with Dr. Dr. Marland Kitchen    Avanell Shackleton, MD  OB/GYN R2  Pager [phone]

## 2019-02-08 NOTE — ED Notes (Signed)
Pt c/o LLQ pain x 2 weeks. States she is scheduled for removal of her left fallopian tube on Monday. Pt c/o copious amounts of brown vaginal discharge. +N/V/D

## 2019-02-08 NOTE — Discharge Instructions (Addendum)
While in the Emergency Department you received IV fluids and IV medications for pain and nausea.    Make sure to get lots of rest, drink plenty of healthy fluids, and eat a nutritious balanced diet. You may take ibuprofen or tylenol over-the-counter as needed for pain, use as directed on the label. You have also been given a prescription for a stronger pain medication. This medication can cause drowsiness or sleepiness. Do NOT operate a vehicle while taking this medication. Do not put yourself in any other position that could potentially cause harm to yourself or those around you.     Follow up with ObGyn as planned. If any symptoms worsen or new symptoms develop that are concerning to you, call your doctor or return to the Emergency Department.

## 2019-02-08 NOTE — ED Triage Notes (Addendum)
PT presents to ED with c.o LLQ abd pain that started about 2 weeks ago -- pt reports she had "a fallopian tube removed on Monday (02/03/2019)", but that she is still having the same pain after surgery.  Endorses fevers, chills, NVD.  Denies drainage to surgical incision.  Endorses "heavy" vaginal bleeding x1 hour PTA. Denies blood clots.        Triage Note   Honor Loh, RN

## 2019-02-10 ENCOUNTER — Telehealth: Payer: Self-pay

## 2019-02-10 ENCOUNTER — Inpatient Hospital Stay
Admission: EM | Admit: 2019-02-10 | Discharge: 2019-02-13 | DRG: 531 | Disposition: A | Discharge status: Home / Self Care | Payer: Medicaid Other | Source: Ambulatory Visit | Attending: Obstetrics and Gynecology | Admitting: Obstetrics and Gynecology

## 2019-02-10 ENCOUNTER — Encounter: Payer: Self-pay | Admitting: Emergency Medicine

## 2019-02-10 DIAGNOSIS — I10 Essential (primary) hypertension: Secondary | ICD-10-CM | POA: Diagnosis present

## 2019-02-10 DIAGNOSIS — R112 Nausea with vomiting, unspecified: Secondary | ICD-10-CM

## 2019-02-10 DIAGNOSIS — R102 Pelvic and perineal pain: Secondary | ICD-10-CM | POA: Diagnosis present

## 2019-02-10 DIAGNOSIS — Z9079 Acquired absence of other genital organ(s): Secondary | ICD-10-CM

## 2019-02-10 DIAGNOSIS — R109 Unspecified abdominal pain: Secondary | ICD-10-CM

## 2019-02-10 DIAGNOSIS — D649 Anemia, unspecified: Secondary | ICD-10-CM | POA: Diagnosis present

## 2019-02-10 DIAGNOSIS — N739 Female pelvic inflammatory disease, unspecified: Principal | ICD-10-CM | POA: Diagnosis present

## 2019-02-10 DIAGNOSIS — R509 Fever, unspecified: Secondary | ICD-10-CM

## 2019-02-10 DIAGNOSIS — R1032 Left lower quadrant pain: Secondary | ICD-10-CM

## 2019-02-10 DIAGNOSIS — E860 Dehydration: Secondary | ICD-10-CM | POA: Diagnosis present

## 2019-02-10 DIAGNOSIS — D473 Essential (hemorrhagic) thrombocythemia: Secondary | ICD-10-CM | POA: Diagnosis present

## 2019-02-10 DIAGNOSIS — G8929 Other chronic pain: Secondary | ICD-10-CM | POA: Diagnosis present

## 2019-02-10 LAB — CBC AND DIFFERENTIAL
Baso # K/uL: 0 10*3/uL (ref 0.0–0.1)
Basophil %: 0.3 %
Eos # K/uL: 0.1 10*3/uL (ref 0.0–0.4)
Eosinophil %: 0.8 %
Hematocrit: 30 % — ABNORMAL LOW (ref 34–45)
Hemoglobin: 8.6 g/dL — ABNORMAL LOW (ref 11.2–15.7)
IMM Granulocytes #: 0 10*3/uL (ref 0.0–0.0)
IMM Granulocytes: 0.3 %
Lymph # K/uL: 2.3 10*3/uL (ref 1.2–3.7)
Lymphocyte %: 24.6 %
MCH: 24 pg — ABNORMAL LOW (ref 26–32)
MCHC: 29 g/dL — ABNORMAL LOW (ref 32–36)
MCV: 82 fL (ref 79–95)
Mono # K/uL: 0.4 10*3/uL (ref 0.2–0.9)
Monocyte %: 4.2 %
Neut # K/uL: 6.4 10*3/uL — ABNORMAL HIGH (ref 1.6–6.1)
Nucl RBC # K/uL: 0 10*3/uL (ref 0.0–0.0)
Nucl RBC %: 0 /100 WBC (ref 0.0–0.2)
Platelets: 588 10*3/uL — ABNORMAL HIGH (ref 160–370)
RBC: 3.7 MIL/uL — ABNORMAL LOW (ref 3.9–5.2)
RDW: 17.3 % — ABNORMAL HIGH (ref 11.7–14.4)
Seg Neut %: 69.8 %
WBC: 9.2 10*3/uL (ref 4.0–10.0)

## 2019-02-10 LAB — RUQ PANEL (ED ONLY)
ALT: 14 U/L (ref 0–35)
AST: 14 U/L (ref 0–35)
Albumin: 4.3 g/dL (ref 3.5–5.2)
Alk Phos: 58 U/L (ref 35–105)
Amylase: 36 U/L (ref 28–100)
Bilirubin,Direct: <0.2 mg/dL (ref 0.0–0.3)
Bilirubin,Total: <0.2 mg/dL (ref 0.0–1.2)
Globulin: 3.1 g/dL (ref 2.7–4.3)
Lipase: 18 U/L (ref 13–60)
Total Protein: 7.4 g/dL (ref 6.3–7.7)

## 2019-02-10 LAB — BASIC METABOLIC PANEL
Anion Gap: 14 (ref 7–16)
CO2: 24 mmol/L (ref 20–28)
Calcium: 9.2 mg/dL (ref 8.8–10.2)
Chloride: 101 mmol/L (ref 96–108)
Creatinine: 0.56 mg/dL (ref 0.51–0.95)
GFR,Black: 142 *
GFR,Caucasian: 123 *
Glucose: 89 mg/dL (ref 60–99)
Lab: 9 mg/dL (ref 6–20)
Potassium: 4.3 mmol/L (ref 3.3–5.1)
Sodium: 139 mmol/L (ref 133–145)

## 2019-02-10 LAB — LACTATE, VENOUS, WHOLE BLOOD: Lactate VEN,WB: 1.1 mmol/L (ref 0.5–2.2)

## 2019-02-10 LAB — PREGNANCY TEST, SERUM: Preg,Serum: NEGATIVE

## 2019-02-10 MED ORDER — DEXTROSE IN LACTATED RINGERS 5 % IV SOLN *I*
125.0000 mL/h | INTRAVENOUS | Status: DC
Start: 2019-02-10 — End: 2019-02-12
  Administered 2019-02-10: 125 mL/h
  Administered 2019-02-10: 125 mL/h via INTRAVENOUS
  Administered 2019-02-10 (×2): 125 mL/h
  Administered 2019-02-11: 125 mL/h via INTRAVENOUS
  Administered 2019-02-11 (×6): 125 mL/h
  Administered 2019-02-11: 20 mL/h
  Administered 2019-02-11: 125 mL/h
  Administered 2019-02-11: 20 mL/h
  Administered 2019-02-11 (×3): 125 mL/h
  Administered 2019-02-11: 125 mL/h via INTRAVENOUS
  Administered 2019-02-11 (×8): 125 mL/h
  Administered 2019-02-11: 125 mL/h via INTRAVENOUS
  Administered 2019-02-11 – 2019-02-12 (×5): 125 mL/h

## 2019-02-10 MED ORDER — KETOROLAC TROMETHAMINE 30 MG/ML IJ SOLN *I*
15.0000 mg | Freq: Once | INTRAMUSCULAR | Status: AC
Start: 2019-02-11 — End: 2019-02-11
  Administered 2019-02-11: 15 mg via INTRAVENOUS
  Filled 2019-02-10: qty 1

## 2019-02-10 MED ORDER — CEFOXITIN SODIUM 2000 MG IN 50ML 0.9% NACL MINIBAG IVPB *I*
2000.0000 mg | Freq: Four times a day (QID) | INTRAVENOUS | Status: DC
Start: 2019-02-10 — End: 2019-02-12
  Administered 2019-02-10 – 2019-02-12 (×6): 2000 mg via INTRAVENOUS
  Filled 2019-02-10 (×8): qty 50

## 2019-02-10 MED ORDER — METRONIDAZOLE IN NACL 5 MG/ML IV SOLUTION *WRAPPED*
500.0000 mg | Freq: Two times a day (BID) | INTRAVENOUS | Status: DC
Start: 2019-02-10 — End: 2019-02-12
  Administered 2019-02-10 – 2019-02-12 (×4): 500 mg via INTRAVENOUS
  Filled 2019-02-10 (×4): qty 100

## 2019-02-10 MED ORDER — KETOROLAC TROMETHAMINE 10 MG PO TABS *I*
20.0000 mg | ORAL_TABLET | Freq: Once | ORAL | Status: DC
Start: 2019-02-10 — End: 2019-02-10

## 2019-02-10 MED ORDER — KETOROLAC TROMETHAMINE 10 MG PO TABS *I*
10.0000 mg | ORAL_TABLET | Freq: Four times a day (QID) | ORAL | Status: DC
Start: 2019-02-11 — End: 2019-02-10

## 2019-02-10 MED ORDER — ACETAMINOPHEN 325 MG PO TABS *I*
650.0000 mg | ORAL_TABLET | ORAL | Status: DC
Start: 2019-02-10 — End: 2019-02-13
  Administered 2019-02-10 – 2019-02-13 (×14): 650 mg via ORAL
  Filled 2019-02-10 (×16): qty 2

## 2019-02-10 MED ORDER — DOXYCYCLINE 100 MG / 110 ML NS *I*
100.0000 mg | Freq: Two times a day (BID) | INTRAVENOUS | Status: DC
Start: 2019-02-10 — End: 2019-02-12
  Administered 2019-02-10 – 2019-02-11 (×3): 100 mg via INTRAVENOUS
  Filled 2019-02-10 (×5): qty 100

## 2019-02-10 MED ORDER — CYCLOBENZAPRINE HCL 10 MG PO TABS *I*
5.0000 mg | ORAL_TABLET | Freq: Three times a day (TID) | ORAL | Status: DC | PRN
Start: 2019-02-10 — End: 2019-02-13
  Administered 2019-02-11 – 2019-02-12 (×2): 5 mg via ORAL
  Filled 2019-02-10 (×2): qty 1

## 2019-02-10 MED ORDER — OXYCODONE HCL 5 MG PO TABS *I*
5.0000 mg | ORAL_TABLET | ORAL | Status: DC | PRN
Start: 2019-02-10 — End: 2019-02-10
  Administered 2019-02-10: 5 mg via ORAL
  Filled 2019-02-10: qty 1

## 2019-02-10 MED ORDER — SENNOSIDES 8.6 MG PO TABS *I*
2.0000 | ORAL_TABLET | Freq: Every day | ORAL | Status: DC
Start: 2019-02-10 — End: 2019-02-13
  Administered 2019-02-10 – 2019-02-12 (×3): 2 via ORAL
  Filled 2019-02-10 (×4): qty 2

## 2019-02-10 MED ORDER — OXYCODONE HCL 10 MG PO TABS *I*
10.0000 mg | ORAL_TABLET | Freq: Four times a day (QID) | ORAL | Status: DC | PRN
Start: 2019-02-10 — End: 2019-02-13
  Administered 2019-02-10 – 2019-02-12 (×7): 10 mg via ORAL
  Filled 2019-02-10 (×7): qty 1

## 2019-02-10 MED ORDER — SODIUM CHLORIDE 0.9 % IV SOLN WRAPPED *I*
125.0000 mL/h | Status: DC
Start: 2019-02-10 — End: 2019-02-10

## 2019-02-10 MED ORDER — SODIUM CHLORIDE 0.9 % FLUSH FOR PUMPS *I*
0.0000 mL/h | INTRAVENOUS | Status: DC | PRN
Start: 2019-02-10 — End: 2019-02-10

## 2019-02-10 MED ORDER — KETOROLAC TROMETHAMINE 10 MG PO TABS *I*
10.0000 mg | ORAL_TABLET | Freq: Four times a day (QID) | ORAL | Status: DC
Start: 2019-02-11 — End: 2019-02-13
  Administered 2019-02-11 – 2019-02-13 (×8): 10 mg via ORAL
  Filled 2019-02-10 (×15): qty 1

## 2019-02-10 MED ORDER — DEXTROSE 5 % FLUSH FOR PUMPS *I*
0.0000 mL/h | INTRAVENOUS | Status: DC | PRN
Start: 2019-02-10 — End: 2019-02-10

## 2019-02-10 MED ORDER — OXYCODONE HCL 5 MG PO TABS *I*
5.0000 mg | ORAL_TABLET | Freq: Four times a day (QID) | ORAL | Status: DC | PRN
Start: 2019-02-10 — End: 2019-02-13
  Administered 2019-02-12 (×2): 5 mg via ORAL
  Filled 2019-02-10 (×2): qty 1

## 2019-02-10 MED ORDER — ONDANSETRON HCL 2 MG/ML IV SOLN *I*
4.0000 mg | Freq: Three times a day (TID) | INTRAMUSCULAR | Status: DC | PRN
Start: 2019-02-10 — End: 2019-02-11
  Administered 2019-02-11: 4 mg via INTRAVENOUS
  Filled 2019-02-10: qty 2

## 2019-02-10 MED ORDER — DOCUSATE SODIUM 100 MG PO CAPS *I*
100.0000 mg | ORAL_CAPSULE | Freq: Two times a day (BID) | ORAL | Status: DC
Start: 2019-02-10 — End: 2019-02-13
  Administered 2019-02-11 – 2019-02-12 (×3): 100 mg via ORAL
  Filled 2019-02-10 (×6): qty 1

## 2019-02-10 MED ORDER — ACETAMINOPHEN 325 MG PO TABS *I*
650.0000 mg | ORAL_TABLET | Freq: Once | ORAL | Status: DC
Start: 2019-02-10 — End: 2019-02-10

## 2019-02-10 MED ORDER — SODIUM CHLORIDE 0.9 % IV BOLUS *I*
1000.0000 mL | Freq: Once | Status: DC
Start: 2019-02-10 — End: 2019-02-10

## 2019-02-10 MED ORDER — IBUPROFEN 600 MG PO TABS *I*
600.0000 mg | ORAL_TABLET | Freq: Four times a day (QID) | ORAL | Status: DC
Start: 2019-02-10 — End: 2019-02-10
  Administered 2019-02-10: 600 mg via ORAL
  Filled 2019-02-10: qty 1

## 2019-02-10 MED ORDER — SODIUM CHLORIDE 0.9 % 100 ML IV SOLN *I*
25.0000 mg | Freq: Four times a day (QID) | INTRAVENOUS | Status: DC | PRN
Start: 2019-02-10 — End: 2019-02-11
  Administered 2019-02-10: 25 mg via INTRAVENOUS
  Filled 2019-02-10: qty 1

## 2019-02-10 NOTE — ED Provider Notes (Addendum)
History     Chief Complaint   Patient presents with    Fever    Post-op Problem     Patient is a 33 y/o female with PMH of anemia and TOA who presents today with the concern of abdominal pain.  She states that last Wednesday, she had surgery for removal of left salpingectomy; she states that since then, she has been experiencing severe LLQ abdominal pain, 10/10 in severity described as a cramping sensation with associated fevers, chills, nausea, and vomiting.  She states that she has been taking oxy and tylenol for her symptoms.  She denies upper respiratory symptoms, urinary symptoms, bowel changes, injury/trauma to the area, or overlying skin changes.       History provided by:  Patient  Language interpreter used: No        Medical/Surgical/Family History     Past Medical History:   Diagnosis Date    Anemia     Depression     GI bleed     hemetesis        Patient Active Problem List   Diagnosis Code    Anemia D64.9    Abdominal pain, unspecified abdominal location R10.9    TOA (tubo-ovarian abscess) N70.93    s/p laparoscopic LS with extensive LOA Z98.890    Abdominal pain R10.9            Past Surgical History:   Procedure Laterality Date    CESAREAN SECTION, UNSPECIFIED      CHOLECYSTECTOMY      GALLBLADDER SURGERY      PR ESOPHAGOGASTRODUODENOSCOPY TRANSORAL DIAGNOSTIC N/A 01/29/2019    Procedure: EGD (ESOPHAGOGASTRODUODENOSCOPY);  Surgeon: Maryann Alar, MD;  Location: [place];  Service: GI    PR LAP,DIAGNOSTIC ABDOMEN N/A 02/03/2019    Procedure: LAPAROSCOPY, DIAGNOSTIC, EXTENSIVE LYSIS OF ADHESIONS;  Surgeon: Thompsoncarrillo, Raquel Sarna, MD;  Location: [place] [facility];  Service: OBGYN     Family History   Problem Relation Age of Onset    Colon cancer Paternal Grandmother     Breast cancer Maternal Grandmother 36          Social History     Tobacco Use    Smoking status: Never Smoker    Smokeless tobacco: Never Used   Substance Use Topics    Alcohol use: Not Currently     Frequency:  Never    Drug use: Never     Living Situation     Questions Responses    Patient lives with Family    Homeless     Caregiver for other family member     External Services     Employment Employed    Domestic Violence Risk                 Review of Systems   Review of Systems   Constitutional: Positive for chills and fever. Negative for appetite change and fatigue.   HENT: Negative for ear pain, rhinorrhea, sneezing and sore throat.    Eyes: Negative for visual disturbance.   Respiratory: Negative for cough, chest tightness and shortness of breath.    Cardiovascular: Negative for chest pain.   Gastrointestinal: Positive for abdominal pain, nausea and vomiting. Negative for abdominal distention, constipation and diarrhea.   Genitourinary: Negative for dysuria, frequency and urgency.   Musculoskeletal: Negative for neck pain and neck stiffness.   Skin: Negative for rash.   Neurological: Negative for dizziness, syncope, weakness, light-headedness, numbness and headaches.  Physical Exam     Triage Vitals  Triage Start: Start, (02/10/19 1517)   First Recorded BP: (!) 136/96, Resp: 18, Temp: 37.3 C (99.1 F), Temp src: TEMPORAL Oxygen Therapy SpO2: 100 %, Oximetry Source: Rt Hand, O2 Device: None (Room air), Heart Rate: 101, (02/10/19 1518)  .  First Pain Reported  0-10 Scale: 10, Pain Location/Orientation: Abdomen, (02/10/19 1518)       Physical Exam  Vitals signs and nursing note reviewed.   Constitutional:       General: She is not in acute distress.     Appearance: She is not diaphoretic.   HENT:      Head: Normocephalic and atraumatic.      Right Ear: External ear normal.      Left Ear: External ear normal.      Nose: Nose normal.   Eyes:      Conjunctiva/sclera: Conjunctivae normal.   Neck:      Musculoskeletal: Normal range of motion and neck supple.   Cardiovascular:      Rate and Rhythm: Normal rate.      Heart sounds: Normal heart sounds. No murmur. No friction rub. No gallop.    Pulmonary:      Effort:  Pulmonary effort is normal. No respiratory distress.      Breath sounds: Normal breath sounds. No wheezing or rales.   Chest:      Chest wall: No tenderness.   Abdominal:      General: Bowel sounds are normal. There is no distension.      Palpations: Abdomen is soft. There is no mass.      Tenderness: There is abdominal tenderness (LLQ). There is no guarding or rebound.   Musculoskeletal: Normal range of motion.         General: No tenderness or deformity.   Skin:     General: Skin is warm and dry.      Coloration: Skin is not pale.      Findings: No erythema or rash.   Neurological:      Mental Status: She is alert and oriented to person, place, and time.      Cranial Nerves: No cranial nerve deficit.      Motor: No abnormal muscle tone.      Coordination: Coordination normal.   Psychiatric:         Mood and Affect: Mood normal.         Thought Content: Thought content normal.         Medical Decision Making   Patient seen by me on:  02/10/2019    Assessment:  33 y/o female presenting with concern of LLQ abdominal pain, fevers, chills, nausea, and vomiting.  On exam, she displays tenderness with palpation of LLQ without guarding, rigidity, or rebound tenderness.      Differential diagnosis:  Post op complication, seroma, abscess, musculoskeletal pain, viral illness, dehydration, electrolyte abnormality, pregnancy     Plan:  Diagnostic: Labs Reviewed  BLOOD CULTURE  BLOOD CULTURE  CBC AND DIFFERENTIAL  BASIC METABOLIC PANEL  RUQ PANEL (ED ONLY)  LACTATE, VENOUS, WHOLE BLOOD  PREGNANCY TEST, SERUM  POCT URINE PREGNANCY    Therapeutic: IVF, tylenol     GYN consult.         Independent review of: Existing labs, chart/prior records    ED Course and Disposition:  Will hospitalize under care of GYN team.             Benny Lennert, PA  APP Review:    I had face-to-face interaction with the patient on 02/10/2019.    I was asked by APP to see this patient due to the complexity of the current medical presentation.       I have reviewed and agree with the above documentation and, in addition:    Our plan is Admission to the gynecological service for symptomatic management and further treatment..    Author:  Audie Clear, DO       Benny Lennert, [place]  02/10/19 [place], [place], DO  02/13/19 1734

## 2019-02-10 NOTE — ED Notes (Signed)
Writer paged Dr. Lois Huxley regarding the following:    ED OBS-Annette Preston pt is wondering about different pain medications as the Tylenol, ibuprofen and oxy are not helping. Thank you, [name]. Please call [name], [place].

## 2019-02-10 NOTE — ED Triage Notes (Signed)
Surgery to remove left fallopian tube on 2/24. Having increased pain and fevers since. Seen in ED over the weekend, but sxs not improving.       Triage Note   Collene Massimino B Randon Somera, RN

## 2019-02-10 NOTE — ED Notes (Signed)
02/10/19 1436   Expected Call-In Information   ED Service [facility] Adult Call-in   PCP/Service Referral Barnetta Chapel, RN  ([facility])   Pt Info note/Reason for sending Pt had surgery on 2/24- Laparoscopy; Pt is experiencing 10/10 pain; Fever of 104; Pt seen in [facility] ED on Saturday (2/29); Pain medications have not been helping; Pt coming from home by car   Pt Coming from MD Office   Call Back number [phone]   Call reported to ASB

## 2019-02-10 NOTE — H&P (Signed)
GYNECOLOGY HISTORY & PHYSICAL      Consult Requested By: ED  Consult Question: postsurgical abdominal pain    HPI: Annette Preston is a 33 y.o. [id number] female s/p laparoscopic left salpingectomy and LOA on 2/24 who presents with pain and emesis. Was seen on 2/29 in the ED for the same issues. Originally admitted to the OB/GYN service 2/19 with pelvic pain and was empirically treated with IV antibiotics for PID based on fever, elevated WBC count, lower abdominal pain, and complex left adnexal mass. Due to failure of pain to improve after 5 days of IV antibiotics, she was taken to OR on 2/24. Improved and was discharged home on POD#1 with plan to complete 2 week course of PO antibiotics. Today, she reports feeling the same as she did when she represented on 2/29. She initially felt better after receiving antiemetics and pain control in ED, but since then she has been unable to tolerate liquids or solids without vomiting. Reports fever of 100.4 yesterday that resolved with tylenol. Has felt chills since. Pain is isolated to LLQ and not controlled with oxycodone and tylenol that patient is taking around the clock. Reports compliance with antibiotic regiment.  Denies dysuria, new vaginal discharge, changes in bowel movements, cough, SOB, URI symptoms, chest pain.     Patient also seen by GI during initial admission for hematemesis, found to have mild gastritis on EGD.     Past Medical History     Past Medical History:   Diagnosis Date    Anemia     Depression     GI bleed     hemetesis         Past Surgical History     Past Surgical History:   Procedure Laterality Date    CESAREAN SECTION, UNSPECIFIED      CHOLECYSTECTOMY      GALLBLADDER SURGERY      PR ESOPHAGOGASTRODUODENOSCOPY TRANSORAL DIAGNOSTIC N/A 01/29/2019    Procedure: EGD (ESOPHAGOGASTRODUODENOSCOPY);  Surgeon: Maryann Alar, MD;  Location: [place];  Service: GI    PR LAP,DIAGNOSTIC ABDOMEN N/A 02/03/2019    Procedure: LAPAROSCOPY,  DIAGNOSTIC, EXTENSIVE LYSIS OF ADHESIONS;  Surgeon: Anderson Malta, MD;  Location: [place] [facility];  Service: OBGYN       Obstetric History     OB History   Gravida Para Term Preterm AB Living   5 5 5     5    SAB TAB Ectopic Multiple Live Births           5      # Outcome Date GA Lbr Len/2nd Weight Sex Delivery Anes PTL Lv   5 Term 2015           4 Term 2013     CS-Unspec      3 Term 2010     CS-Unspec      2 Term 2009     CS-Unspec      1 Term      CS-Unspec          Social History     Social History     Tobacco Use    Smoking status: Never Smoker    Smokeless tobacco: Never Used   Substance Use Topics    Alcohol use: Not Currently     Frequency: Never    Drug use: Never       Family History     Family History            Breast cancer  Maternal Grandmother    Colon cancer Paternal Grandmother          Allergies   No Known Allergies (drug, envir, food or latex)    Current Home Medications   Medication reconciliation performed at the time of evaluation. See Med Rec portion of patient's chart.     Review of Systems  All other ROS negative unless otherwise noted in HPI.       Physical Exam     Vitals:    02/10/19 1518 02/10/19 1719   BP: (!) 136/96    Pulse: 101    Resp: 18    Temp: 37.3 C (99.1 F) 37.7 C (99.9 F)   TempSrc: Temporal Oral   SpO2: 100%    Weight: 98.4 kg (217 lb)    Height: 1.524 m (5')        General: well developed and well nourished, alert and oriented, in no acute distress  HEENT: Normocephalic, atraumatic  Cardiovascular: Regular rate and rhythm with no murmurs  Respiratory: Clear to auscultation bilaterally, normal respiratory effort  Abdomen: soft, non-distended, tender to palpation in LLQ, no rebound or guarding  Extremities/Skin: No edema noted      Labs   Labs pending:  CBC w/ differential  BMP  RUQ panel  Pregnancy test  Blood cultures  Lactate    Imaging     CT abdomen/pelvis 2/29:   Small, structure with central low-attenuation and mild perifocal stranding in the left adnexa.  This may represent a physiologic adnexal cyst or an abscess if recent surgery involved removal of the ovary.        Assessment & Recommendations     Annette Preston is a 33 y.o. [id number] female s/p laparoscopic left salpingectomy and LOA on 2/24 who presents with pain and emesis. Mild hypertension, vitals otherwise normal. Exam significant for moderate tenderness to palpation of LLQ. CT 2/29 suggestive of left ovarian cyst, seen intraoperatively. Labs pending. Due to return to ED 2x since surgery and inability to tolerate PO, will admit for outpatient treatment failure of PID.    PID  - vitals q 4 hours  - IV cefoxy, doxy, flagyl (3/2-)  - pain: Motrin, tylenol scheduled. Oxycodone PRN.   - nausea: zofran, phenergan  - will follow-up labs   - if fails to improve over the next few days, will consider reimaging    F: D5LR 125 cc/hr  E: PRN  N: regular     DVT prophylaxis: ambulation  GI prophylaxis: colace, senna    D/w Dr. Edsel Petrin.     Ilda Foil, MD, MPH  PGY2 OB/GYN  Pager [phone]

## 2019-02-10 NOTE — Telephone Encounter (Signed)
T/c from patient. Patient states she had surgery last week. Went to ED on 2/29 and got more pain meds but states pain in not relieved.     Patient was given phenergan and oxycodone at ED.    Patient states pain is 10 out of 10. Endorses fever of 100.4.    Discussed with Dr. Rene Kocher. Per MD, patient needs to go to ED. Patient advised. ED notified.

## 2019-02-10 NOTE — ED Notes (Signed)
Report given to [name], RN in ED OBS.    Any questions please call [name], [place].

## 2019-02-11 MED ORDER — ONDANSETRON HCL 2 MG/ML IV SOLN *I*
4.0000 mg | Freq: Three times a day (TID) | INTRAMUSCULAR | Status: DC | PRN
Start: 2019-02-11 — End: 2019-02-12

## 2019-02-11 MED ORDER — SODIUM CHLORIDE 0.9 % 100 ML IV SOLN *I*
25.0000 mg | Freq: Four times a day (QID) | INTRAVENOUS | Status: DC | PRN
Start: 2019-02-11 — End: 2019-02-12
  Administered 2019-02-11 – 2019-02-12 (×4): 25 mg via INTRAVENOUS
  Filled 2019-02-11 (×3): qty 1

## 2019-02-11 MED ORDER — PROMETHAZINE HCL 25 MG/ML IJ SOLN *I*
INTRAMUSCULAR | Status: AC
Start: 2019-02-11 — End: 2019-02-11
  Filled 2019-02-11: qty 1

## 2019-02-11 MED ORDER — LIDOCAINE 5 % EX PTCH *I*
1.0000 | MEDICATED_PATCH | CUTANEOUS | Status: DC
Start: 2019-02-11 — End: 2019-02-13
  Administered 2019-02-11 – 2019-02-12 (×2): 1 via TRANSDERMAL
  Filled 2019-02-11 (×2): qty 1

## 2019-02-11 MED ORDER — MORPHINE SULFATE 4 MG/ML IV SOLN *WRAPPED*
4.0000 mg | Freq: Once | INTRAVENOUS | Status: AC
Start: 2019-02-11 — End: 2019-02-11
  Administered 2019-02-11: 4 mg via INTRAVENOUS
  Filled 2019-02-11: qty 1

## 2019-02-11 NOTE — Progress Notes (Signed)
GYNECOLOGIC SURGERY PROGRESS NOTE    LOS: 0 days     Subjective     Orine reports feeling uncomfortable this morning, though sleeping soundly this morning. Pain is well-controlled with current regimen. Her current diet is regular and she reports tolerating this with some nausea though has only had one episode of vomiting in the hospital per patient report. She has ambulated. Denies chest pain, SOB, or lightheadedness. Otherwise without concerns this morning.    Objective     Vitals:    02/10/19 1719 02/10/19 2216 02/11/19 0124 02/11/19 0604   BP:  108/67 108/64 106/52   BP Location:  Left arm  Left arm   Pulse:  80 84 71   Resp:  _0 Temp: 37.7 C (99.9 F) 37.3 C (99.1 F) 36 C (96.8 F) 36.2 C (97.2 F)   TempSrc: Oral Temporal Temporal Temporal   SpO2:  97% 96% 96%   Weight:       Height:         General appearance: NAD, comfortable, sleeping soundly in bed  Lungs: Clear to auscultation bilaterally, normal respiratory effort  Heart: S1S2 RRR  Abdomen: soft, mildly tender in the LLQ though overall non-tender throughout, no rebound or guarding, + BS   Extremities: No cyanosis, no edema  Incisions: Clean, dry and intact - healing well from surgery    Labs     Blood counts  Recent Labs   Lab 02/10/19  1929 02/08/19  1548   WBC 9.2 9.9   Seg Neut % 69.8 74.3   Neut # K/uL 6.4* 7.3*   Hemoglobin 8.6* 8.8*   Hematocrit 30* 30*   Platelets 588* 315*     Metabolic panel  Recent Labs   Lab 02/10/19  1929 02/08/19  1548   Sodium 139 141   Potassium 4.3 4.5   Chloride 101 105   CO2 24 23   UN 9 10   Creatinine 0.56 0.64   GFR,Caucasian 123 118   Glucose 89 93   Calcium 9.2 9.3      Hepatobiliary testing  Recent Labs   Lab 02/10/19  1929   Total Protein 7.4   Albumin 4.3   ALT 14   AST 14   Alk Phos 58   Bilirubin,Total <0.2   Bilirubin,Direct <0.2         Assessment & Plan     33 y.o. s/p laparoscopic left salpingectomy and LOA on 2/24 readmitted 3/2 with pain & emesis and failure of outpatient PID therapy for IV  antibiotic treatment.     PID  - IV cefoxy, doxy, flagyl (3/2-)  - pain: toradol PO, tylenol, oxy, flexeril  - nausea: zofran, phenergan  - CT ab/pelvis 2/29: Small structure with mild perifocal stranding in the left adnexa-  physiologic adnexal cyst or an abscess if left ovary removed  - WBC 9.2    F: D5LR 125 cc/hr  E: PRN  N: regular    Dispo: Pending clinical course, likely 24-48 hours.     Bernadene Bell, MD  OBGYN Resident PGY-2  Pager [phone]

## 2019-02-11 NOTE — Plan of Care (Signed)
Problem: Safety  Goal: Patient will remain free of falls  Outcome: Maintaining     Problem: Pain/Comfort  Goal: Patient's pain or discomfort is manageable  Outcome: Maintaining     Problem: Nutrition  Goal: Patient's nutritional status is maintained or improved  Outcome: Maintaining     Problem: Mobility  Goal: Patient's functional status is maintained or improved  Outcome: Maintaining     Problem: Psychosocial  Goal: Demonstrates ability to cope with illness  Outcome: Maintaining     Problem: Cognitive function  Goal: Cognitive function will be maintained or return to baseline  Outcome: Maintaining     Patient arrived to floor and oriented to unit. Resting comfortably.

## 2019-02-11 NOTE — Progress Notes (Signed)
02/11/19 1339   UM Patient Class Review   Patient Class Review Observation     Carlyon Prows, RN   Utilization Management Nurse  Office: [phone] / [phone]  Pager: [phone]

## 2019-02-11 NOTE — Progress Notes (Addendum)
Patient's pain has not been controlled with current interventions and regimen. Requesting to speak to provider regarding pain management. Covering provider, [place] resident, paged. Rogg, MD up to speak to patient. Lidocaine patch ordered; RN will apply.

## 2019-02-11 NOTE — Progress Notes (Signed)
Brief Gyn Progress Note:   In to see patient for increasing pain. Resting comfortably in bed and visiting with husband. States she is in 10/10 pain in her lower abdomen. Denies nausea, vomiting, chest pain, shortness of breath or other symptoms.     Vitals:    02/11/19 2351   BP: 123/64   Pulse: 69   Resp: 18   Temp: 37.1 C (98.8 F)   Weight:    Height:      Resting comfortably   Normal respiratory effort   Abdomen: Soft, nondistended, mildly tender to palpation at trigger point in left lower abdomen. No rebound or guarding    Discussed overall stable picture. Discussed that we would unlikely be able to completely get rid of her pain, but that her physical exam and current workup rules out anything emergent or life threatening to act on. Discussed likelihood of chronic lower abdominal pain and evident trigger point.     Will give lidocaine patch and reassess.   Aviva Kluver, MD  OBGYN R3  Pager Number [phone]

## 2019-02-11 NOTE — ED Notes (Signed)
Report Given To      Pilar Jarvis, RN [facility] 5)    Descriptive Sentence / Reason for Admission     Fever and Post-op Problem    Active Issues / Relevant Events     Last Filed Vitals    02/11/19 0947   BP: 118/74   Pulse: 92   Resp: 16   Temp: 35.5 C (95.9 F)   SpO2: 99%       To Do List    -Symptom management  -MIVF D5 LR at 176ml/hr  -IV antibiotics    Anticipatory Guidance / Discharge Planning

## 2019-02-12 DIAGNOSIS — N7093 Salpingitis and oophoritis, unspecified: Secondary | ICD-10-CM

## 2019-02-12 MED ORDER — DEXTROSE 5 % FLUSH FOR PUMPS *I*
0.0000 mL/h | INTRAVENOUS | Status: DC | PRN
Start: 2019-02-12 — End: 2019-02-13

## 2019-02-12 MED ORDER — PROMETHAZINE HCL 25 MG PO TABS *I*
25.0000 mg | ORAL_TABLET | Freq: Three times a day (TID) | ORAL | Status: DC
Start: 2019-02-12 — End: 2019-02-13
  Administered 2019-02-12 – 2019-02-13 (×3): 25 mg via ORAL
  Filled 2019-02-12 (×4): qty 1

## 2019-02-12 MED ORDER — PROMETHAZINE HCL 25 MG PO TABS *I*
25.0000 mg | ORAL_TABLET | Freq: Four times a day (QID) | ORAL | Status: DC | PRN
Start: 2019-02-12 — End: 2019-02-12

## 2019-02-12 MED ORDER — METRONIDAZOLE 250 MG PO TABS *I*
500.0000 mg | ORAL_TABLET | Freq: Two times a day (BID) | ORAL | Status: DC
Start: 2019-02-12 — End: 2019-02-13
  Administered 2019-02-12: 500 mg via ORAL
  Filled 2019-02-12 (×2): qty 2

## 2019-02-12 MED ORDER — DIPHENHYDRAMINE HCL 25 MG ORAL SOLID *WRAPPED*
50.0000 mg | Freq: Once | ORAL | Status: AC
Start: 2019-02-12 — End: 2019-02-12
  Administered 2019-02-12: 50 mg via ORAL
  Filled 2019-02-12: qty 2

## 2019-02-12 MED ORDER — SODIUM CHLORIDE 0.9 % FLUSH FOR PUMPS *I*
0.0000 mL/h | INTRAVENOUS | Status: DC | PRN
Start: 2019-02-12 — End: 2019-02-13

## 2019-02-12 MED ORDER — ONDANSETRON 4 MG PO TBDP *I*
4.0000 mg | ORAL_TABLET | Freq: Three times a day (TID) | ORAL | Status: DC | PRN
Start: 2019-02-12 — End: 2019-02-12

## 2019-02-12 MED ORDER — PROMETHAZINE HCL 25 MG/ML IJ SOLN *I*
12.5000 mg | Freq: Once | INTRAMUSCULAR | Status: AC
Start: 2019-02-12 — End: 2019-02-12
  Administered 2019-02-12: 12.5 mg via INTRAMUSCULAR
  Filled 2019-02-12: qty 1

## 2019-02-12 MED ORDER — DIPHENHYDRAMINE-LIDOCAINE-MAALOX (BMX/FIRST MOUTHWASH) *WRAPPED*
15.0000 mL | Freq: Once | ORAL | Status: AC
Start: 2019-02-13 — End: 2019-02-13
  Administered 2019-02-13: 15 mL via OROMUCOSAL
  Filled 2019-02-12: qty 15

## 2019-02-12 MED ORDER — HALOPERIDOL LACTATE 5 MG/ML IJ SOLN *I*
0.5000 mg | Freq: Once | INTRAMUSCULAR | Status: AC
Start: 2019-02-13 — End: 2019-02-12
  Administered 2019-02-12: 0.5 mg via INTRAVENOUS
  Filled 2019-02-12: qty 1

## 2019-02-12 MED ORDER — DOXYCYCLINE HYCLATE 100 MG PO TABS *I*
100.0000 mg | ORAL_TABLET | Freq: Two times a day (BID) | ORAL | Status: DC
Start: 2019-02-12 — End: 2019-02-13
  Administered 2019-02-12 (×2): 100 mg via ORAL
  Filled 2019-02-12 (×3): qty 1

## 2019-02-12 NOTE — Progress Notes (Signed)
[place] [facility] Day: 3    Subjective     Tariya called out this morning with 10/10 pain. Patient sleeping upon entry to room. Upon waking, reports pain is not well-controlled with current regimen. States the oxycodone works best, hoping for something as strong. Her current diet is regular and she reports tolerating this with some nausea. No emesis over the past day. She has ambulated. Denies chest pain, SOB, or lightheadedness.     Objective     Vitals:    02/11/19 1155 02/11/19 1604 02/11/19 2046 02/11/19 2351   BP: 108/76 106/72 120/88 123/64   BP Location: Left arm Left arm Right arm Right arm   Pulse: 86 88 95 69   Resp: '16 18 18 18   '$ Temp: 35.8 C (96.4 F) 35.5 C (95.9 F) 36.6 C (97.9 F) 37.1 C (98.8 F)   TempSrc: Temporal Temporal Temporal Temporal   SpO2: 97% 96% 97% 97%   Weight:       Height:         General appearance: NAD, appears comfortable  Lungs: Breathing comfortably on room air  Heart: regular rate  Abdomen: soft, mildly tender in the LLQ though overall non-tender throughout, no rebound or guarding, + BS   Extremities: No cyanosis, no edema  Incisions: Clean, dry and intact     Labs     Blood counts  Recent Labs   Lab 02/10/19  1929 02/08/19  1548   WBC 9.2 9.9   Seg Neut % 69.8 74.3   Neut # K/uL 6.4* 7.3*   Hemoglobin 8.6* 8.8*   Hematocrit 30* 30*   Platelets 588* 440*     Metabolic panel  Recent Labs   Lab 02/10/19  1929 02/08/19  1548   Sodium 139 141   Potassium 4.3 4.5   Chloride 101 105   CO2 24 23   UN 9 10   Creatinine 0.56 0.64   GFR,Caucasian 123 118   Glucose 89 93   Calcium 9.2 9.3      Hepatobiliary testing  Recent Labs   Lab 02/10/19  1929   Total Protein 7.4   Albumin 4.3   ALT 14   AST 14   Alk Phos 58   Bilirubin,Total <0.2   Bilirubin,Direct <0.2         Assessment & Plan     33 y.o. s/p laparoscopic left salpingectomy and LOA on 2/24 readmitted 3/2 with pain & emesis and failure of outpatient PID therapy for IV antibiotic treatment.      PID  - IV cefoxy, doxy, flagyl (3/2-)  - pain: toradol PO, tylenol, oxy, flexeril. Discussed expectations and that we will not be able to get her pain to a level of 0. Discussed adding gabapentin, patient reports hives with this medication. Discussed possibility of trigger point injection during the day, and adding benadryl to help sleep. Also discussed referral to chronic pelvic pain clinic will likely be the best long term plan for pain control. Patient agreeable to this plan and avoiding further narcotics at this point. Benadryl 50 mg PO ordered.  - nausea: zofran, phenergan  - CT ab/pelvis 2/29: Small structure with mild perifocal stranding in the left adnexa-  physiologic adnexal cyst or an abscess if left ovary removed  - WBC 9.2    F: D5LR 125 cc/hr  E: PRN  N: regular    Dispo: Pending clinical course, likely 24-48 hours.     Donnetta Simpers  Lois Huxley, MD, MPH  PGY2 OB/GYN  Pager [phone]

## 2019-02-12 NOTE — Progress Notes (Signed)
Brief progress note: In to see patient for 10/10 pain. Initially when she was visualized in the room, she was resting comfortably, upon seeing the provider she began to rock that she stated was due to pain. States that it is making her nauseous and describes the pain in her lower abdomen. Otherwise denies other symptoms. She states she is having regular bowel movements, which are normal in consistency for her.     Vitals:    02/12/19 2135   BP: 118/70   Pulse: 82   Resp: 18   Temp: 36.5 C (97.7 F)   Weight:    Height:        Resting in bed as described above   Normal respiratory effort   Abdomen: Soft, nondistended, 2 pinpoint areas in lower abdomen tender to palpation similar to a trigger point.     Will give IM phenergan to treat nausea and continue to monitor.   D/w Dr. Marland Kitchen,   Aviva Kluver, MD  OBGYN R3  Pager Number [phone]

## 2019-02-12 NOTE — Progress Notes (Signed)
02/12/19 1426   UM Patient Class Review   Patient Class Review Inpatient   Patient Class effective 02/12/2019.    Carlyon Prows, RN   Utilization Management Nurse  Office: [phone] / [phone]  Pager: [phone]

## 2019-02-12 NOTE — Progress Notes (Signed)
Interdisciplinary Rounding Note   Annette Preston was discussed today during health team rounds.   Problems addressed:  Active Problems:    Abdominal pain    Pelvic inflammatory disease      The plan of care was reviewed with the following health team members:  Charge Nurse  Social Worker  [place] Coordinator    Martinique Palma, [place] 02/12/2019 10:04 AM

## 2019-02-13 ENCOUNTER — Telehealth: Payer: Self-pay

## 2019-02-13 ENCOUNTER — Other Ambulatory Visit: Payer: Self-pay | Admitting: Obstetrics and Gynecology

## 2019-02-13 ENCOUNTER — Other Ambulatory Visit: Payer: Self-pay

## 2019-02-13 ENCOUNTER — Encounter: Payer: MEDICAID | Admitting: Obstetrics and Gynecology

## 2019-02-13 DIAGNOSIS — R102 Pelvic and perineal pain: Secondary | ICD-10-CM

## 2019-02-13 MED ORDER — IBUPROFEN 200 MG PO TABS *I*
600.0000 mg | ORAL_TABLET | Freq: Four times a day (QID) | ORAL | 0 refills | Status: DC | PRN
Start: 2019-02-13 — End: 2019-02-13

## 2019-02-13 MED ORDER — PROMETHAZINE HCL 25 MG RE SUPP *I*
25.0000 mg | Freq: Four times a day (QID) | RECTAL | 0 refills | Status: DC | PRN
Start: 2019-02-13 — End: 2019-08-26
  Filled 2019-02-13: qty 30, 7d supply, fill #0

## 2019-02-13 MED ORDER — CYCLOBENZAPRINE HCL 5 MG PO TABS *I*
5.0000 mg | ORAL_TABLET | Freq: Three times a day (TID) | ORAL | 0 refills | Status: DC | PRN
Start: 2019-02-13 — End: 2019-08-26

## 2019-02-13 MED ORDER — ACETAMINOPHEN 325 MG PO TABS *I*
650.0000 mg | ORAL_TABLET | Freq: Four times a day (QID) | ORAL | 0 refills | Status: AC | PRN
Start: 2019-02-13 — End: 2019-03-15

## 2019-02-13 MED ORDER — PROMETHAZINE HCL 12.5 MG PO TABS *I*
12.5000 mg | ORAL_TABLET | Freq: Four times a day (QID) | ORAL | 0 refills | Status: AC | PRN
Start: 2019-02-13 — End: 2019-02-18

## 2019-02-13 MED ORDER — OXYCODONE HCL 5 MG PO TABS *I*
5.0000 mg | ORAL_TABLET | ORAL | 0 refills | Status: DC | PRN
Start: 2019-02-13 — End: 2019-08-26
  Filled 2019-02-13: qty 12, 2d supply, fill #0

## 2019-02-13 MED ORDER — PROMETHAZINE HCL 25 MG RE SUPP *I*
25.0000 mg | Freq: Four times a day (QID) | RECTAL | Status: DC | PRN
Start: 2019-02-13 — End: 2019-02-13
  Administered 2019-02-13: 25 mg via RECTAL
  Filled 2019-02-13 (×2): qty 1

## 2019-02-13 MED ORDER — ONDANSETRON 4 MG PO TBDP *I*
8.0000 mg | ORAL_TABLET | Freq: Once | ORAL | Status: AC
Start: 2019-02-13 — End: 2019-02-13
  Administered 2019-02-13: 8 mg via ORAL
  Filled 2019-02-13: qty 2

## 2019-02-13 MED ORDER — PANTOPRAZOLE SODIUM 20 MG PO TBEC *I*
40.0000 mg | DELAYED_RELEASE_TABLET | Freq: Every day | ORAL | 0 refills | Status: AC
Start: 2019-02-13 — End: 2019-03-15
  Filled 2019-02-13: qty 60, 30d supply, fill #0

## 2019-02-13 MED ORDER — DOCUSATE SODIUM 100 MG PO CAPS *I*
100.0000 mg | ORAL_CAPSULE | Freq: Two times a day (BID) | ORAL | 5 refills | Status: AC | PRN
Start: 2019-02-13 — End: 2019-08-12

## 2019-02-13 MED ORDER — HALOPERIDOL LACTATE 5 MG/ML IJ SOLN *I*
0.5000 mg | Freq: Once | INTRAMUSCULAR | Status: AC
Start: 2019-02-13 — End: 2019-02-13
  Administered 2019-02-13: 0.5 mg via INTRAVENOUS
  Filled 2019-02-13: qty 1

## 2019-02-13 MED ORDER — OXYCODONE HCL 5 MG PO TABS *I*
5.0000 mg | ORAL_TABLET | Freq: Four times a day (QID) | ORAL | Status: DC | PRN
Start: 2019-02-13 — End: 2019-02-13
  Administered 2019-02-13: 5 mg via ORAL
  Filled 2019-02-13 (×2): qty 1

## 2019-02-13 MED ORDER — KETOROLAC TROMETHAMINE 30 MG/ML IJ SOLN *I*
30.0000 mg | Freq: Once | INTRAMUSCULAR | Status: AC
Start: 2019-02-13 — End: 2019-02-13
  Administered 2019-02-13: 30 mg via INTRAVENOUS
  Filled 2019-02-13: qty 1

## 2019-02-13 MED ORDER — PANTOPRAZOLE SODIUM 40 MG PO TBEC *I*
40.0000 mg | DELAYED_RELEASE_TABLET | Freq: Every morning | ORAL | Status: DC
Start: 2019-02-13 — End: 2019-02-13
  Filled 2019-02-13: qty 1

## 2019-02-13 NOTE — Discharge Instructions (Signed)
Abdominal Pain   WHAT YOU NEED TO KNOW:   Abdominal pain can be dull, achy, or sharp. You may have pain in one area of your abdomen, or in your entire abdomen. Your pain may be caused by a condition such as constipation, food sensitivity or poisoning, infection, or a blockage. Abdominal pain can also be from a hernia, appendicitis, or an ulcer. Liver, gallbladder, or kidney conditions can also cause abdominal pain. The cause of your abdominal pain may be unknown.   DISCHARGE INSTRUCTIONS:   Return to the emergency department if:    You have new chest pain or shortness of breath.     You have pulsing pain in your upper abdomen or lower back that suddenly becomes constant.     Your pain is in the right lower abdominal area and worsens with movement.      You have a fever over 100.43F (38C) or shaking chills.      You are vomiting and cannot keep food or liquids down.      Your pain does not improve or gets worse over the next 8 to 12 hours.      You see blood in your vomit or bowel movements, or they look black and tarry.      Your skin or the whites of your eyes turn yellow.      You are a woman and have a large amount of vaginal bleeding that is not your monthly period.    Contact your healthcare provider if:    You have pain in your lower back.      You are a man and have pain in your testicles.     You have pain when you urinate.      You have questions or concerns about your condition or care.    Follow up with your healthcare provider within 24 hours or as directed:  Write down your questions so you remember to ask them during your visits.   Medicines:    Medicines  may be given to calm your stomach and prevent vomiting or to decrease pain. Ask how to take pain medicine safely.     Take your medicine as directed.  Contact your healthcare provider if you think your medicine is not helping or if you have side effects. Tell him of her if you are allergic to any medicine. Keep a list of the  medicines, vitamins, and herbs you take. Include the amounts, and when and why you take them. Bring the list or the pill bottles to follow-up visits. Carry your medicine list with you in case of an emergency.     Copyright [employer] 2019 Information is for [employer] use only and may not be sold, redistributed or otherwise used for commercial purposes. All illustrations and images included in CareNotes are the copyrighted property of [employer] Inc[employer] or [place]  The above information is an educational aid only. It is not intended as medical advice for individual conditions or treatments. Talk to your doctor, nurse or pharmacist before following any medical regimen to see if it is safe and effective for you.

## 2019-02-13 NOTE — Progress Notes (Signed)
Brief OBGYN Progress Note:    In to see patient to check on her this afternoon. She received rectal phenergan suppository around 2:30pm. She reports afterward she had a bowel movement, but did feel that her nausea improved. After feeling better the patient tolerated 5 mg of Oxycodone and a cup of ginger ale and a small amount of cracker. Patient reports that after eating she had an "episode of vomiting" and that her nurse saw the episode. In talking with Tori, RN she reports that patient had episode of spitting up clear saliva and that pill / food / ginger ale was not visualized within this.     Discussed with patient again that she has completed treatment of PID with 14 day course of antibiotics. No further treatment indicated. Referral for pelvic pain clinic has been placed and recommend she follow-up with CPP for further management of her ongoing pelvic pain. Discussed with patient the importance of GI follow-up for her ongoing nausea / vomiting. Recommend patient call GI for follow-up when she is discharged. Further recommend patient continue protonix as recommended by GI for acid suppression.     Patient stable for discharge home with pain and nausea medication prescribed.    D/w Dr. Eulis Manly, MD  OBGYN Resident PGY-2  Pager [phone]

## 2019-02-13 NOTE — Progress Notes (Signed)
[facility] Day: 3    Subjective     Annette Preston was sleeping comfortably in bed when in to see her this morning, reports feeling "so-so". Upon waking, reports her pain is about the same as it has been. Her current diet is regular and she reports tolerating this with some nausea, but no additional vomiting beyond the one episode earlier. She has ambulated. Denies chest pain, SOB, or lightheadedness.     Reviewed that she will likely be going home this morning, given that she has now completed her antibiotic regimen fr treatment of PID/TOA.     Objective     Vitals:    02/12/19 2135 02/12/19 2307 02/13/19 0010 02/13/19 0301   BP: 118/70 115/72     BP Location: Right arm Right arm     Pulse: 82 80     Resp: '18 18 18 16   '$ Temp: 36.5 C (97.7 F) 36.4 C (97.5 F)     TempSrc: Temporal Temporal     SpO2: 98% 99%     Weight:       Height:         General appearance: NAD, appears comfortable  Lungs: Breathing comfortably on room air  Heart: regular rate  Abdomen: soft, mildly tender in the LLQ though overall non-tender throughout, no rebound or guarding, + BS   Extremities: No cyanosis, no edema  Incisions: Clean, dry and intact     Labs     Blood counts  Recent Labs   Lab 02/10/19  1929 02/08/19  1548   WBC 9.2 9.9   Seg Neut % 69.8 74.3   Neut # K/uL 6.4* 7.3*   Hemoglobin 8.6* 8.8*   Hematocrit 30* 30*   Platelets 588* 628*     Metabolic panel  Recent Labs   Lab 02/10/19  1929 02/08/19  1548   Sodium 139 141   Potassium 4.3 4.5   Chloride 101 105   CO2 24 23   UN 9 10   Creatinine 0.56 0.64   GFR,Caucasian 123 118   Glucose 89 93   Calcium 9.2 9.3      Hepatobiliary testing  Recent Labs   Lab 02/10/19  1929   Total Protein 7.4   Albumin 4.3   ALT 14   AST 14   Alk Phos 58   Bilirubin,Total <0.2   Bilirubin,Direct <0.2         Assessment & Plan     33 y.o. s/p laparoscopic left salpingectomy and LOA on 2/24 readmitted 3/2 with pain & emesis and failure of outpatient PID therapy for IV  antibiotic treatment.     PID  - IV cefoxy, doxy, flagyl (3/2-3/4) --> PO doxy, flagyl (3/4 - ) - will plan to d/c all antibiotics after 1 additional dose today as patient has completed treatment of PID/TOA  - pain: toradol PO, tylenol, oxy, flexeril  - nausea: phenergan  - CT ab/pelvis 2/29: Small structure with mild perifocal stranding in the left adnexa-  physiologic adnexal cyst or an abscess if left ovary removed  - WBC 9.2    F: D5LR 125 cc/hr  E: PRN  N: regular    Dispo: Likely discharge home later today, given that patient remains afebrile, no WBC, and has now completed treatment for PID.     Bernadene Bell, MD  OBGYN Resident PGY-2  Pager [phone]

## 2019-02-13 NOTE — Telephone Encounter (Signed)
-----   Message from Conchita Paris, MD sent at 02/13/2019  6:55 AM EST -----  Regarding: New patient - questionnaire  Hello!    I just sent a referral for this patient to be seen in CPP clinic, bit she will need a questionnaire sent to her. I was not sure which pool to message for that to be sent to her.    Thank you for your help!    -Annamary Carolin, MD  OBGYN Resident PGY-2  Pager [phone]

## 2019-02-13 NOTE — Discharge Summary (Signed)
Name: Annette Preston MRN: [id number] DOB: 1986/05/30     Admit Date: 02/10/2019   Date of Discharge: 02/13/2019     Patient was accepted for discharge to   Home or Self Care [1]           Discharge Attending Physician: Gaylyn Cheers Harley Hallmark, MD      Hospitalization Summary    CONCISE NARRATIVE: Annette Preston is a 33 year old female who was readmitted on POD#7 after laparoscopic salpingectomy and lysis of adhesions for pyosalpinx, after failing outpatient treatment of PID due to nausea and vomiting. In the hospital she received IV cefoxitin, doxycycline, and flagyl and was eventually switched to PO antibiotics to finally complete her full 14 day course of treatment. Throughout her hospital stay she remained afebrile with a normal white count and without evidence of infection. Her pain was thought to likely be chronic pain related and a referral to chronic pelvic pain clinic was placed. Her nausea continued, but was eventually helped with phenergan per rectum. She was discharged home on 02/13/2019 tolerating PO after anti-emetic medication and with her pain controlled on an oral pain regimen. She will have outpatient follow-up with [facility], chronic pelvic pain clinic, and gastroenterology.                           Signed: Conchita Paris, MD  On: 02/13/2019  at: 4:51 PM

## 2019-02-13 NOTE — Progress Notes (Signed)
Provider notified multiple times throughout night r/t lower abdominal pain and 2 episodes of emesis. IM phenergan, IV haldol, and prn pain medications administered. At 0600 writer entered room and found patient sleeping soundly. Abdomen remains rounded, but soft.    Nursing will continue to monitor.    Bethann Humble, RN  02/13/2019  6:11 AM

## 2019-02-13 NOTE — Telephone Encounter (Signed)
Waiting for provider to review the referral to send the pelvic pain questionnaire.

## 2019-02-13 NOTE — Plan of Care (Signed)
Problem: Safety  Goal: Patient will remain free of falls  Outcome: Adequate for discharge     Problem: Pain/Comfort  Goal: Patient's pain or discomfort is manageable  Outcome: Adequate for discharge     Problem: Nutrition  Goal: Patient's nutritional status is maintained or improved  Outcome: Adequate for discharge     Problem: Mobility  Goal: Patient's functional status is maintained or improved  Outcome: Adequate for discharge     Problem: Psychosocial  Goal: Demonstrates ability to cope with illness  Outcome: Adequate for discharge     Problem: Cognitive function  Goal: Cognitive function will be maintained or return to baseline  Outcome: Adequate for discharge       Reviewed medication and discharge instructions with pt and family member. Pt verbalized understanding. Pt adequate for discharge.     Janae Sauce, RN

## 2019-02-13 NOTE — Progress Notes (Addendum)
Writer attempted to give pt morning meds. Pt vomited all of the pills after administration. Provider aware. Haldol and Toradol given at 0957. Pt still stated an hour later that she was in 8/10 pain and had another round of emesis. Provider aware ordered disintegrating Zofran. Nursing will continue to monitor.     Pt states still having pain and emesis. Provider aware.     Pt refusing all oral medications. Phenergan rectally administered at 1427. Nursing will continue to monitor.     Janae Sauce, RN

## 2019-02-15 ENCOUNTER — Emergency Department
Admission: EM | Admit: 2019-02-15 | Discharge: 2019-02-15 | Disposition: A | Discharge status: Home / Self Care | Payer: Medicaid Other | Source: Ambulatory Visit | Attending: Emergency Medicine | Admitting: Emergency Medicine

## 2019-02-15 ENCOUNTER — Encounter: Payer: Self-pay | Admitting: Emergency Medicine

## 2019-02-15 DIAGNOSIS — Z9889 Other specified postprocedural states: Secondary | ICD-10-CM | POA: Insufficient documentation

## 2019-02-15 DIAGNOSIS — R102 Pelvic and perineal pain: Secondary | ICD-10-CM

## 2019-02-15 DIAGNOSIS — R112 Nausea with vomiting, unspecified: Secondary | ICD-10-CM | POA: Insufficient documentation

## 2019-02-15 DIAGNOSIS — R509 Fever, unspecified: Secondary | ICD-10-CM

## 2019-02-15 DIAGNOSIS — N739 Female pelvic inflammatory disease, unspecified: Secondary | ICD-10-CM

## 2019-02-15 DIAGNOSIS — N898 Other specified noninflammatory disorders of vagina: Secondary | ICD-10-CM | POA: Insufficient documentation

## 2019-02-15 DIAGNOSIS — R1030 Lower abdominal pain, unspecified: Secondary | ICD-10-CM

## 2019-02-15 DIAGNOSIS — N939 Abnormal uterine and vaginal bleeding, unspecified: Secondary | ICD-10-CM

## 2019-02-15 LAB — CBC AND DIFFERENTIAL
Baso # K/uL: 0.1 10*3/uL (ref 0.0–0.1)
Basophil %: 0.7 %
Eos # K/uL: 0.1 10*3/uL (ref 0.0–0.4)
Eosinophil %: 1 %
Hematocrit: 31 % — ABNORMAL LOW (ref 34–45)
Hemoglobin: 9.1 g/dL — ABNORMAL LOW (ref 11.2–15.7)
IMM Granulocytes #: 0 10*3/uL (ref 0.0–0.0)
IMM Granulocytes: 0.3 %
Lymph # K/uL: 1.8 10*3/uL (ref 1.2–3.7)
Lymphocyte %: 20.4 %
MCH: 23 pg/cell — ABNORMAL LOW (ref 26–32)
MCHC: 29 g/dL — ABNORMAL LOW (ref 32–36)
MCV: 79 fL (ref 79–95)
Mono # K/uL: 0.4 10*3/uL (ref 0.2–0.9)
Monocyte %: 4.9 %
Neut # K/uL: 6.4 10*3/uL — ABNORMAL HIGH (ref 1.6–6.1)
Nucl RBC # K/uL: 0 10*3/uL (ref 0.0–0.0)
Nucl RBC %: 0 /100 WBC (ref 0.0–0.2)
Platelets: 815 10*3/uL — ABNORMAL HIGH (ref 160–370)
RBC: 4 MIL/uL (ref 3.9–5.2)
RDW: 18.7 % — ABNORMAL HIGH (ref 11.7–14.4)
Seg Neut %: 72.7 %
WBC: 8.8 10*3/uL (ref 4.0–10.0)

## 2019-02-15 LAB — RUQ PANEL (ED ONLY)
Albumin: 4.5 g/dL (ref 3.5–5.2)
Bilirubin,Direct: <0.2 mg/dL (ref 0.0–0.3)
Bilirubin,Total: <0.2 mg/dL (ref 0.0–1.2)
Lipase: 17 U/L (ref 13–60)
Total Protein: 8.4 g/dL — ABNORMAL HIGH (ref 6.3–7.7)

## 2019-02-15 LAB — HOLD BLUE

## 2019-02-15 LAB — PLASMA PROF 7 (ED ONLY)
Anion Gap,PL: 10 (ref 7–16)
CO2,Plasma: 25 mmol/L (ref 20–28)
Chloride,Plasma: 99 mmol/L (ref 96–108)
Creatinine: 0.6 mg/dL (ref 0.51–0.95)
GFR,Black: 138 *
GFR,Caucasian: 120 *
Glucose,Plasma: 97 mg/dL (ref 60–99)
Sodium,Plasma: 134 mmol/L (ref 133–145)
UN,Plasma: 9 mg/dL (ref 6–20)

## 2019-02-15 LAB — LACTATE, PLASMA: Lactate: 1.4 mmol/L (ref 0.5–2.2)

## 2019-02-15 LAB — PREGNANCY TEST, SERUM: Preg,Serum: NEGATIVE

## 2019-02-15 MED ORDER — SODIUM CHLORIDE 0.9 % FLUSH FOR PUMPS *I*
0.0000 mL/h | INTRAVENOUS | Status: DC | PRN
Start: 2019-02-15 — End: 2019-02-16

## 2019-02-15 MED ORDER — MORPHINE SULFATE 4 MG/ML IV SOLN *WRAPPED*
4.0000 mg | Freq: Once | INTRAVENOUS | Status: AC
Start: 2019-02-15 — End: 2019-02-15
  Administered 2019-02-15: 4 mg via INTRAVENOUS
  Filled 2019-02-15: qty 1

## 2019-02-15 MED ORDER — OXYCODONE HCL 10 MG PO TABS *I*
10.0000 mg | ORAL_TABLET | ORAL | 0 refills | Status: AC | PRN
Start: 2019-02-15 — End: 2019-02-17

## 2019-02-15 MED ORDER — SODIUM CHLORIDE 0.9 % IV BOLUS *I*
1000.0000 mL | Freq: Once | Status: AC
Start: 2019-02-15 — End: 2019-02-15
  Administered 2019-02-15: 1000 mL via INTRAVENOUS

## 2019-02-15 MED ORDER — PROMETHAZINE HCL 25 MG/ML IJ SOLN *I*
12.5000 mg | Freq: Once | INTRAMUSCULAR | Status: AC
Start: 2019-02-15 — End: 2019-02-15
  Administered 2019-02-15: 12.5 mg via INTRAVENOUS
  Filled 2019-02-15: qty 1

## 2019-02-15 MED ORDER — KETOROLAC TROMETHAMINE 30 MG/ML IJ SOLN *I*
15.0000 mg | Freq: Once | INTRAMUSCULAR | Status: AC
Start: 2019-02-15 — End: 2019-02-15
  Administered 2019-02-15: 15 mg via INTRAVENOUS
  Filled 2019-02-15: qty 1

## 2019-02-15 MED ORDER — DEXTROSE 5 % FLUSH FOR PUMPS *I*
0.0000 mL/h | INTRAVENOUS | Status: DC | PRN
Start: 2019-02-15 — End: 2019-02-16

## 2019-02-15 NOTE — Discharge Instructions (Signed)
Your blood work was normal.    The gynecology team saw you and did not find concerns.    Please follow up with your Pcp and Gynecologist this week.

## 2019-02-15 NOTE — ED Triage Notes (Signed)
Left fallopian tube was removed 2 weeks ago. Since even before the surgery she has had LLQ abd pain and fevers. Pt now reports her vaginal discharge has blood tinged in it. Denies urinary symptoms. Mask applied at triage for fevers.        Triage Note   Stan Head, RN

## 2019-02-15 NOTE — ED Provider Notes (Addendum)
History     Chief Complaint   Patient presents with    Abdominal Pain     Annette Preston is a 33 y.o. female who presents with left lower pelvic pain, blood- tinged vaginal discharge, fevers, emesis. To review, she was initially admitted to [facility] from 01/28/19 - 02/04/19 with hematemesis and persistent LLQ abdominal pain. She was admitted to OB/GYN service. She underwent diagnostic laparoscopy, left salpingectomy, LOA on 02/03/19. She was re-admitted on POD #7 from 02/10/19 - 02/13/19 due to failing outpatient treatment for nausea and for PID.     She presents today complaining primarily of left lower abdominal/ pelvic pain. Her pain has persisted for the past month, and was not relieved since her surgery 2 weeks ago. Her pain is localized to her LLQ/ left pelvic region, radiates to her left low back. She is still having same left-sided pelvic pain as she had prior to surgery. She took her antibiotics as prescribed, without relief. She reports vomiting and fevers. Yesterday at [facility] she documented fever of 100.4 F. She last had dry heaves 2 hours ago. She is not eating, cannot tolerate oral intake. She endorses vaginal discharge tinged with a little blood. Her discharge is clear- mucousy, no odor. No urinary symptoms. She denies flank pain, dysuria, change in bowel pattern, diarrhea. She endorses light- headedness.     She currently takes oxycodone and phenergan for her pain and nausea. Her PMH is notable for s/p cholecystectomy, andanemia, on PO iron supplement.       History provided by:  Patient  Language interpreter used: No    Abdominal Pain   Pain location: LLQ, left pelvic region.  Pain radiation: L back.  Duration: 1 month.  Chronicity:  New  Associated symptoms: fever, nausea, vaginal bleeding, vaginal discharge and vomiting    Associated symptoms: no chest pain, no chills, no constipation, no cough, no diarrhea, no dysuria, no fatigue, no hematuria, no shortness of breath and no sore throat    Vaginal  discharge:     Vaginal discharge characteristics: clear- mucousy, blood tinged.      Medical/Surgical/Family History     Past Medical History:   Diagnosis Date    Anemia     Depression     GI bleed     hemetesis        Patient Active Problem List   Diagnosis Code    Anemia D64.9    Abdominal pain, unspecified abdominal location R10.9    TOA (tubo-ovarian abscess) N70.93    s/p laparoscopic LS with extensive LOA Z98.890    Abdominal pain R10.9    Pelvic inflammatory disease N73.9            Past Surgical History:   Procedure Laterality Date    CESAREAN SECTION, UNSPECIFIED      CHOLECYSTECTOMY      GALLBLADDER SURGERY      PR ESOPHAGOGASTRODUODENOSCOPY TRANSORAL DIAGNOSTIC N/A 01/29/2019    Procedure: EGD (ESOPHAGOGASTRODUODENOSCOPY);  Surgeon: Maryann Alar, MD;  Location: [facility];  Service: GI    PR LAP,DIAGNOSTIC ABDOMEN N/A 02/03/2019    Procedure: LAPAROSCOPY, DIAGNOSTIC, EXTENSIVE LYSIS OF ADHESIONS;  Surgeon: Thompsoncarrillo, Raquel Sarna, MD;  Location: [facility];  Service: OBGYN     Family History   Problem Relation Age of Onset    Colon cancer Paternal Grandmother     Breast cancer Maternal Grandmother 67          Social History     Tobacco Use  Smoking status: Never Smoker    Smokeless tobacco: Never Used   Substance Use Topics    Alcohol use: Not Currently     Frequency: Never    Drug use: Never     Living Situation     Questions Responses    Patient lives with Family    Homeless     Caregiver for other family member     External Services     Employment Employed    Domestic Violence Risk                 Review of Systems   Review of Systems   Constitutional: Positive for fever. Negative for activity change, appetite change, chills, diaphoresis, fatigue and unexpected weight change.   HENT: Negative for congestion, dental problem, drooling, ear discharge, ear pain, facial swelling, hearing loss, mouth sores, nosebleeds, postnasal drip, rhinorrhea, sinus pressure, sinus pain,  sneezing, sore throat, tinnitus, trouble swallowing and voice change.    Eyes: Negative for visual disturbance.   Respiratory: Negative for cough, choking, chest tightness, shortness of breath, wheezing and stridor.    Cardiovascular: Negative for chest pain, palpitations and leg swelling.   Gastrointestinal: Positive for abdominal pain, nausea and vomiting. Negative for abdominal distention, anal bleeding, blood in stool, constipation, diarrhea and rectal pain.   Genitourinary: Positive for pelvic pain (Left Lower Pelvic Pain), vaginal bleeding and vaginal discharge. Negative for decreased urine volume, difficulty urinating, dyspareunia, dysuria, enuresis, flank pain, frequency, genital sores, hematuria, menstrual problem, urgency and vaginal pain.   Musculoskeletal: Negative for arthralgias, back pain, gait problem, joint swelling, myalgias, neck pain and neck stiffness.   Skin: Negative for color change, pallor, rash and wound.   Neurological: Negative for dizziness, tremors, seizures, syncope, facial asymmetry, speech difficulty, weakness, light-headedness, numbness and headaches.   Hematological: Negative for adenopathy. Does not bruise/bleed easily.   Psychiatric/Behavioral: Negative for agitation, behavioral problems, confusion, decreased concentration, dysphoric mood, hallucinations, self-injury, sleep disturbance and suicidal ideas. The patient is not nervous/anxious and is not hyperactive.        Physical Exam     Triage Vitals  Triage Start: Start, (02/15/19 1246)   First Recorded BP: 132/78, Resp: 19, Temp: 37.5 C (99.5 F), Temp src: Oral Oxygen Therapy SpO2: 100 %, Oximetry Source: Lt Hand, O2 Device: None (Room air), Heart Rate: 99, (02/15/19 1249)  .  First Pain Reported  0-10 Scale: 10, Pain Location/Orientation: Abdomen, (02/15/19 1249)       Physical Exam  Vitals signs and nursing note reviewed.   Constitutional:       General: She is not in acute distress.     Appearance: Normal appearance. She  is well-developed. She is ill-appearing. She is not toxic-appearing or diaphoretic.   HENT:      Head: Normocephalic.      Nose: Nose normal.   Eyes:      Conjunctiva/sclera: Conjunctivae normal.   Neck:      Musculoskeletal: Normal range of motion and neck supple.      Thyroid: No thyromegaly.      Vascular: Normal carotid pulses. No carotid bruit or JVD.   Cardiovascular:      Rate and Rhythm: Normal rate and regular rhythm.      Heart sounds: Normal heart sounds. No murmur. No friction rub. No gallop.    Pulmonary:      Effort: Pulmonary effort is normal. No respiratory distress.      Breath sounds: Normal breath sounds. No stridor. No  wheezing or rales.   Chest:      Chest wall: No tenderness.   Abdominal:      General: Abdomen is flat. Bowel sounds are normal. There is no distension or abdominal bruit. There are no signs of injury.      Palpations: Abdomen is soft. There is no shifting dullness, fluid wave, hepatomegaly, splenomegaly, mass or pulsatile mass.      Tenderness: There is abdominal tenderness. There is no right CVA tenderness, left CVA tenderness, guarding or rebound. Negative signs include Murphy's sign, Rovsing's sign, McBurney's sign, psoas sign and obturator sign.      Hernia: No hernia is present.      Comments: Diffuse pelvic tenderness, especially in left pelvic area.       Genitourinary:     Vagina: No signs of injury and foreign body. Erythema present. No vaginal discharge, tenderness or bleeding.      Cervix: Cervical motion tenderness and discharge present. No friability.      Uterus: Normal. Tender. Not deviated, not enlarged and not fixed.       Adnexa: Right adnexa normal.        Right: No mass, tenderness or fullness.          Left: Tenderness present. No mass or fullness.        Comments: Pinkish vaginal discharge, no odor. There is cervical motion tenderness. The cervix is healthy appearing, no erythema, no swelling, non- friable.   Musculoskeletal: Normal range of motion.          General: No tenderness.   Lymphadenopathy:      Cervical: No cervical adenopathy.   Skin:     General: Skin is warm and dry.      Capillary Refill: Capillary refill takes less than 2 seconds.      Coloration: Skin is not pale.      Findings: No erythema or rash.   Neurological:      General: No focal deficit present.      Mental Status: She is alert and oriented to person, place, and time.      Motor: No weakness.   Psychiatric:         Mood and Affect: Mood normal.         Behavior: Behavior normal.         Thought Content: Thought content normal.         Judgment: Judgment normal.         Medical Decision Making   Patient seen by me on:  02/15/2019    Assessment:  Annette Preston is a 33 y.o. female who is s/p diagnostic laparoscopy, left salpingectomy, LOA on 02/03/19, present with LLQ abdominal pain, blood- tinged vaginal discharge, fevers, emesis. No urinary or bowel symptoms.     Differential diagnosis:  Infection, post-op issue, pregnancy. Less likely UTI or GI issue.     Plan:  Labs: CBC with diff, ED7, RUQ panel, Lactate, & Serum Pregnancy.  Pelvic exam with vaginal cuiltures  IV fluids  Pain control (IV toradol and IV Morphine)  Promethazine 12.5mg  IV.  Consult with OB/GYN.      ED Course and Disposition:  Lab work reviewed. No elevated WBC. Normal Hematocrit.  Lactate normal.  Pt still in pain.             I Alto Denver, am scribing for and in the presence of Maxie Barb, NP.  02/15/2019 4:27 PM     I, Maxie Barb, NP,  personally performed the services described in this documentation, as scribed by Alto Denver in my presence, and it is accurate and complete.              Alto Denver  02/15/19 [place], Czarina Gingras Jo, NP  02/15/19 1700

## 2019-02-15 NOTE — Provider Consult (Signed)
GYNECOLOGY CONSULTATION HISTORY & PHYSICAL      Consult Requested By: Maxie Barb, NP from ED  Consult Question: postop LLQ pain    HPI: Annette Preston is a 33 y.o. [id number] female who presents with 1 day of increasingly severe pain in the setting of 1 month of chronic pelvic pain.     She reports constant, cramping, 10/10 LLQ pain radiating to her back for the past 1 month. She has had the pain daily except for 1 day following her diagnostic laparoscopy, LS, and LOA on 02/03/19, but the pain returned shortly after. She usually takes oxycodone and 2 tablets of tylenol for the pain which brings it down to a 6/10 for 1.5 hours, but the pain returns afterwards. Hot packs do not relieve the pain either, and walking exacerbates it. She also notes continuous nausea/vomiting, although she is able to keep down her medications.    She presents today following 1 day of worsening pain 20/10 in her LLQ which started suddenly while in bed yesterday morning, radiating into her groin and left back. She tried taking 2 oxycodone and 2 tylenol but it did not improve her pain. Associated symptoms include vomiting and an inability to tolerate PO intake (3 times yesterday, but reports improved from her normal, last meal 5pm yesterday), brown-yellow vaginal discharge with some blood, 1 episode of drenching night sweats, and temperature 100.4 yesterday night. She denies hematuria, dysuria, urinary frequency or urgency. Her LMP was 1 week ago and she is sexually active with no current contraception.       Past Medical History     Past Medical History:   Diagnosis Date    Anemia     Depression     GI bleed     hemetesis       Past Surgical History     Past Surgical History:   Procedure Laterality Date    CESAREAN SECTION, UNSPECIFIED      CHOLECYSTECTOMY      GALLBLADDER SURGERY      PR ESOPHAGOGASTRODUODENOSCOPY TRANSORAL DIAGNOSTIC N/A 01/29/2019    Procedure: EGD (ESOPHAGOGASTRODUODENOSCOPY);  Surgeon: Maryann Alar, MD;   Location: [place];  Service: GI    PR LAP,DIAGNOSTIC ABDOMEN N/A 02/03/2019    Procedure: LAPAROSCOPY, DIAGNOSTIC, EXTENSIVE LYSIS OF ADHESIONS;  Surgeon: Anderson Malta, MD;  Location: [place] [facility];  Service: OBGYN       Obstetric History     OB History   Gravida Para Term Preterm AB Living   5 5 5     5    SAB TAB Ectopic Multiple Live Births           5      # Outcome Date GA Lbr Len/2nd Weight Sex Delivery Anes PTL Lv   5 Term 2015           4 Term 2013     CS-Unspec      3 Term 2010     CS-Unspec      2 Term 2009     CS-Unspec      1 Term      CS-Unspec          Social History     Social History     Tobacco Use    Smoking status: Never Smoker    Smokeless tobacco: Never Used   Substance Use Topics    Alcohol use: Not Currently     Frequency: Never    Drug use: Never  Family History     Family History            Breast cancer Maternal Grandmother    Colon cancer Paternal Grandmother          Allergies     Allergies   Allergen Reactions    Gabapentin Anaphylaxis       Current Home Medications   Medication reconciliation performed at the time of evaluation. See Med Rec portion of patient's chart.   Notable medications include: phenergan, tylenol, flexeril, protonix, oxycodone      Review of Systems  All other ROS negative unless otherwise noted in HPI.       Physical Exam     Vitals:    02/15/19 1249 02/15/19 1714   BP: 132/78 117/81   BP Location:  Left arm   Pulse: 99 90   Resp: 19 16   Temp: 37.5 C (99.5 F) 36.8 C (98.2 F)   TempSrc: Oral Temporal   SpO2: 100% 96%   Weight: 99.8 kg (220 lb)    Height: 1.524 m (5')        General: well-appearing, NAD  Mental: alert and oriented  HEENT: Normocephalic, atraumatic  Respiratory: normal respiratory effort  Abdomen: Soft, non-distended, moderate tenderness to palpation in LLQ, voluntary guarding, no rebound, not peritonitic      Labs   All labs reviewed and notable for:    Normal electrolytes  WBC 8.8  Hgb 9.1  Negative  BHCG      Assessment & Recommendations     Annette Preston is a 33 y.o. [id number] female with multiple recent admission for LLQ pain, nausea and vomiting, including a laparoscopic LS, LOA on 02/03/19 presenting with worsening LLQ pain. She was discharged from [facility] on 3/5 for similar complaints with an analgesic and antiemetic regimen. Referrals had been made for her to follow up with GI for her chronic nausea/vomiting and with the chronic pelvic pain clinic.     She is clinically well-appearing with normal vitals and a reassuring abdominal exam. Pain likely related to her chronic pelvic pain, as well as the extensive intraabdominal adhesive disease noted at the time of her surgery. Reassured her that it does not appear that her infection is getting worse and there is no indication for further intervention at this time. Likely will take time for her pain to improve, and the goal is for her pain to be manageable. Unrealistic to expect zero pain. Discussed that she should continue tylenol around the clock with oxycodone as needed. She can call the office on Monday morning to get an appointment to be seen earlier that her scheduled 3/26 appointment if she needs refills of her pain medications.    Patient okay to be discharged from a GYN standpoint.      D/w Dr. Bonnielee Haff, MD  Obstetrics and Gynecology Resident, PGY3  Pager: [phone]

## 2019-02-15 NOTE — ED Notes (Signed)
Pt up and ambulatory without assistance. Pt tolerating PO intake. Pt discharge instructions reviewed. Pt comfortable with discharge planning and verbalizes understanding of discharge instruction. Pt will follow up with PCP. Pt belongings with pt. Pt safe to discharge at this time. Pt leaving to home via her significant other driving her. Pt has access to residence.

## 2019-02-16 LAB — BLOOD CULTURE
Bacterial Blood Culture: 0
Bacterial Blood Culture: 0

## 2019-02-17 ENCOUNTER — Ambulatory Visit: Payer: Medicaid Other | Attending: Obstetrics and Gynecology | Admitting: Obstetrics and Gynecology

## 2019-02-17 ENCOUNTER — Encounter: Payer: Self-pay | Admitting: Obstetrics and Gynecology

## 2019-02-17 ENCOUNTER — Telehealth: Payer: Self-pay

## 2019-02-17 VITALS — BP 128/74 | Wt 213.8 lb

## 2019-02-17 DIAGNOSIS — M7918 Myalgia, other site: Secondary | ICD-10-CM

## 2019-02-17 DIAGNOSIS — R109 Unspecified abdominal pain: Secondary | ICD-10-CM

## 2019-02-17 DIAGNOSIS — R110 Nausea: Secondary | ICD-10-CM

## 2019-02-17 DIAGNOSIS — R102 Pelvic and perineal pain: Secondary | ICD-10-CM

## 2019-02-17 LAB — N. GONORRHOEAE DNA AMPLIFICATION: N. gonorrhoeae DNA Amplification: 0

## 2019-02-17 LAB — VAGINITIS SCREEN: DNA PROBE: Vaginitis Screen:DNA Probe: 0

## 2019-02-17 LAB — CHLAMYDIA PLASMID DNA AMPLIFICATION: Chlamydia Plasmid DNA Amplification: 0

## 2019-02-17 NOTE — Telephone Encounter (Signed)
T/c from patient. Calling with pelvic pain. Was seen in ED on 3/7 for pelvic pain post laparoscopy. Patient has history of PID.     Patient is taking Oxycodone every 4-6 hours alternating with Tylenol. Patient is also taking Flexeril. Patient denies unusual vaginal odor. Patient having some dark red/ brown vaginal discharge. Denies fever. Denies vomiting & diarrhea.     Per Dr. Gaylyn Cheers, schedule for afternoon appointment today. Patient scheduled for an appointment with Dr. Ozzie Hoyle.

## 2019-02-17 NOTE — Progress Notes (Signed)
Annette Preston  Location: [facility]     Subjective     Annette Preston is a 33 y.o. [id number] who presents for follow-up from recent hospitilzation. She was initially admitted to [facility] from 01/28/19 - 02/04/19 with hematemesis and persistent LLQ abdominal pain. She began treatment for PID during that hospitalization.  Due to lack of improvement in her pain she underwent diagnostic laparoscopy, left salpingectomy, LOA on 02/03/19. The pathology showed acute and chronic salpingitis. She was discharged home for outpatient completion of her PID treatment. She was re-admitted on POD #7 from 02/10/19 - 02/13/19 due to failing outpatient treatment of PID, due to ongoing nausea. She was transitioned back to PO antibiotics and pain medication, and completed her 14 day PID treatment regimen while in the hospital. She was ultimately discharged with a referral to pelvic pain clinic for further management of her ongoing pelvic pain without etiology, and recommendation for further outpatient GI work-up for nausea and vomiting.     She most recently was seen in the ED on 02/15/2019 for ongoing pain with reports of the sam symptoms.     Today she continues to reports constant, cramping, 10/10 LLQ pain. She has had the pain daily except for 1 day following her diagnostic laparoscopy, but the pain returned shortly after. She usually takes oxycodone and tylenol for the pain which brings it down to a 6/10 for 1.5 hours, but the pain returns afterwards. Hot packs do not relieve the pain either, and walking exacerbates it. She also notes continuous nausea/vomiting, although she is able to keep down her medications. The Phenergan helps with her nausea.     Today she reports that the pain continues and feels like it is getting worse. She continues to take Oxycodone and Tylenol for pain control, but overall it is not better.    She has not called GI yet for follow-up. The referral that was placed  for the pelvic pain clinic is still under review.     Patient's medications, allergies, past medical, surgical, obstetrical, gynecologic, social and family histories were reviewed and updated as appropriate.    General ROS: negative   Respiratory ROS: no cough, shortness of breath, or wheezing  Cardiovascular ROS: no chest pain or dyspnea on exertion  Gastrointestinal ROS: reports ongoing abdominal pain in the LLQ and nausea  Urinary ROS: no dysuria, trouble voiding, or hematuria  Musculoskeletal ROS: negative    Objective     BP 128/74    Wt 97 kg (213 lb 12.8 oz)    LMP 02/07/2019    BMI 41.75 kg/m      General: well developed and well nourished, alert and oriented, in no acute distress  Abdomen: soft, tender to palpation in the LLQ with no rebound or guarding, BS present, 4 trigger points identified  CV: Regular rate  Lung: Normal respiratory effort on RA    Assessment     Annette Preston is an 33 y.o. [id number] presenting to the office for follow-up from recent hospitalization related to ongoing pelvic pain, s/p diagnostic laparoscopy, left salpingectomy, LOA on 02/03/19 and full 14 day treatment of PID with antibiotics without obvious source of ongoing pain  .  1. Combined abdominal and pelvic pain     2. Pelvic pain        Plan     Nausea  - Recommended patient call GI office for follow-up  - Phenergan PRN sent from hospital for nausea  Abdominal Pain  - Discussed pain expectations and reviewed that if this is a chronic pain process the goal and expectation is not for zero pain, but rather for manageable pain symptoms  - Exam remains benign with acute source of infection requiring intervention today  - Discussed that at this point she should not need opiate level of pain control and we will not be refilling her Oxycodone prescription today - patient expressed udnerstanding  - Referral sent to Chronic Pelvic Pain clinic  - Tylenol and Motrin ATC for continued pain management  - Trigger point injections  performed today at 4 trigger points, 1 muscle group - see separate procedure note for further details    Dispo:  She will follow-up with GI for ongoing nausea symptoms and with Chronic Pelvic Pain clinic. Further recommended that if she has not been seen by Chronic Pelvic Pain clinic and finds the trigger point injection helpful she can follow-up with our office for repeat injections.    Patient seen and discussed with Dr. Otelia Santee, MD  OBGYN Resident PGY-2  Pager [phone]

## 2019-02-17 NOTE — Procedures (Addendum)
Trigger Point Injections    Reason for injection: Myofascial pain with trigger points.    Area to be injected:    Abdomen: yes   Back: no   Pelvic floor: no    Location of trigger points:   Left rectus abdominis   Total number: 4        Anesthetic Agent:    Lidocaine 1%   Total Volume: 5 mL    Pre-Injection Pain Level:  Pain    02/17/19 1432   PainSc:  10 - Worst pain ever   PainLoc: Generalized     Post-Injection Pain Level: 8/10    Adverse Reaction: no    Dr. Jeralyn Bennett present throughout entire procedure    Bernadene Bell, MD  OBGYN Resident PGY-2  Pager [phone]

## 2019-02-18 ENCOUNTER — Telehealth: Payer: Self-pay

## 2019-02-18 LAB — UNMAPPED LAB RESULTS
Basophil # (HT): 0.1 10 3/uL — NL (ref 0.0–0.2)
Basophil % (HT): 1 % — NL (ref 0–3)
Eosinophil # (HT): 0 10 3/uL — NL (ref 0.0–0.6)
Eosinophil % (HT): 1 % — NL (ref 0–5)
Hematocrit (HT): 33 % — ABNORMAL LOW (ref 35–47)
Hemoglobin (HGB) (HT): 9.5 g/dL — ABNORMAL LOW (ref 12.0–16.0)
Lymphocyte # (HT): 1.3 10 3/uL — NL (ref 1.0–4.8)
Lymphocyte % (HT): 18 % — NL (ref 15–45)
MCHC (HT): 29.2 g/dL — ABNORMAL LOW (ref 31.0–37.5)
MCV (HT): 79 fL — ABNORMAL LOW (ref 80–100)
Mean Corpuscular Hemoglobin (MCH) (HT): 23 pg — ABNORMAL LOW (ref 26.0–34.0)
Monocyte # (HT): 0.2 10 3/uL — NL (ref 0.1–1.0)
Monocyte % (HT): 3 % — NL (ref 0–15)
Neutrophil # (HT): 5.3 10 3/uL — NL (ref 1.8–8.0)
Platelets (HT): 644 10 3/uL — ABNORMAL HIGH (ref 150–450)
RBC (HT): 4.13 10 6/uL — NL (ref 3.80–5.20)
RDW (HT): 16.9 % — ABNORMAL HIGH (ref 0.0–15.2)
Seg Neut % (HT): 77 % — ABNORMAL HIGH (ref 45–75)
WBC (HT): 7 10 3/uL — NL (ref 4.0–11.0)

## 2019-02-18 NOTE — Telephone Encounter (Addendum)
02/18/2019 Phone call from patient with complaints of abdominal and left leg pain as well as numbness in her left leg. Seen in the office 02/17/19, received trigger point injections. States pain and numbness in her leg started last night around 10 pm. States abdomen is painful. Denies any other paralysis. Denies any numbness in other extremities. No asymmetry in facial features. Advised patient will notify provider of complaints and see what they advise. Gerarda Gunther, RN    Return call from patient, states she is worried about how she is feeling. Still no other complaints. Advised waiting for provider to review.   Gerarda Gunther, RN

## 2019-02-18 NOTE — Telephone Encounter (Signed)
02/18/2019 Advised patient per MD note. Informed her to monitor symptoms if worsening can follow up at urgent care given no PCP. Patient states trigger points didn't help with pain, she is awaiting to be seen at pelvic pain clinic has yet to be scheduled. Advised patient to call their office. If pain worsens or no improvement patient to call and move appointment up. Pending appointment in 2 weeks. Gerarda Gunther, RN

## 2019-02-18 NOTE — Telephone Encounter (Signed)
Dermatomes injected in her procedure do not overlap with those of the leg, as noted in the image below:    Image pulled from [email or web address].    Given this, it is more likely that these symptoms are unrelated to the procedure.    Thanks,  Berna Spare, MD

## 2019-02-19 LAB — UNMAPPED LAB RESULTS
Basophil # (HT): 0 10 3/uL — NL (ref 0.0–0.2)
Basophil % (HT): 0 % — NL (ref 0–3)
Eosinophil # (HT): 0.1 10 3/uL — NL (ref 0.0–0.6)
Eosinophil % (HT): 2 % — NL (ref 0–5)
Hematocrit (HT): 27 % — ABNORMAL LOW (ref 35–47)
Hemoglobin (HGB) (HT): 7.8 g/dL — ABNORMAL LOW (ref 12.0–16.0)
Lymphocyte # (HT): 1.9 10 3/uL — NL (ref 1.0–4.8)
Lymphocyte % (HT): 37 % — NL (ref 15–45)
MCHC (HT): 28.8 g/dL — ABNORMAL LOW (ref 31.0–37.5)
MCV (HT): 80 fL — NL (ref 80–100)
Mean Corpuscular Hemoglobin (MCH) (HT): 22.9 pg — ABNORMAL LOW (ref 26.0–34.0)
Monocyte # (HT): 0.3 10 3/uL — NL (ref 0.1–1.0)
Monocyte % (HT): 6 % — NL (ref 0–15)
Neutrophil # (HT): 2.8 10 3/uL — NL (ref 1.8–8.0)
Platelets (HT): 515 10 3/uL — ABNORMAL HIGH (ref 150–450)
RBC (HT): 3.4 10 6/uL — ABNORMAL LOW (ref 3.80–5.20)
RDW (HT): 16.9 % — ABNORMAL HIGH (ref 0.0–15.2)
Seg Neut % (HT): 55 % — NL (ref 45–75)
WBC (HT): 5.2 10 3/uL — NL (ref 4.0–11.0)

## 2019-02-20 LAB — UNMAPPED LAB RESULTS
ABO RH Blood Type (HT): A POS — NL
Antibody Screen (HT): NEGATIVE — NL
Basophil # (HT): 0 10 3/uL — NL (ref 0.0–0.2)
Basophil % (HT): 0 % — NL (ref 0–3)
Eosinophil # (HT): 0.1 10 3/uL — NL (ref 0.0–0.6)
Eosinophil % (HT): 1 % — NL (ref 0–5)
Hematocrit (HT): 31 % — ABNORMAL LOW (ref 35–47)
Hemoglobin (HGB) (HT): 8.9 g/dL — ABNORMAL LOW (ref 12.0–16.0)
Lymphocyte # (HT): 1.7 10 3/uL — NL (ref 1.0–4.8)
Lymphocyte % (HT): 16 % — NL (ref 15–45)
MCHC (HT): 28.5 g/dL — ABNORMAL LOW (ref 31.0–37.5)
MCV (HT): 82 fL — NL (ref 80–100)
Mean Corpuscular Hemoglobin (MCH) (HT): 23.2 pg — ABNORMAL LOW (ref 26.0–34.0)
Monocyte # (HT): 0.5 10 3/uL — NL (ref 0.1–1.0)
Monocyte % (HT): 4 % — NL (ref 0–15)
Neutrophil # (HT): 8.7 10 3/uL — ABNORMAL HIGH (ref 1.8–8.0)
Platelets (HT): 551 10 3/uL — ABNORMAL HIGH (ref 150–450)
RBC (HT): 3.83 10 6/uL — NL (ref 3.80–5.20)
RDW (HT): 16.6 % — ABNORMAL HIGH (ref 0.0–15.2)
Seg Neut % (HT): 79 % — ABNORMAL HIGH (ref 45–75)
WBC (HT): 11 10 3/uL — NL (ref 4.0–11.0)

## 2019-02-21 ENCOUNTER — Telehealth: Payer: Self-pay

## 2019-02-21 NOTE — Telephone Encounter (Signed)
02/21/2019 Phone call from patient with complaints of continued pelvic pain. States she went to [facility] and was advised she still has PID based on exam. Writer reviewed ED note from [facility] it read as follows ;      "Vernetti, Jesse Fall, MD - 02/20/2019 You were seen here in the Emergency Department today for abdominal pain. Your lab work showed no significant findings to explain your pain. Your pelvic exam was suggestive of PID, which we will treat with antibiotics (first doses were given in the ED). You need to make an appointment to follow up with your OB/GYN. After thorough evaluation it was determined that you are safe for discharge at this time. Some illnesses are best understood and treated over time. Please read the attached pamphlet(s) for more information about your diagnosis and treatment. You were provided with a prescription for antibiotics, which you should take only as prescribed."    Patient informed writer she received no prescription for antibiotics. She is in a lot of discomfort. States Ibuprofen and Tylenol are not helping. States she isn't taking the prescribed oxyCODONE (ROXICODONE) 10 MG immediate release tablet that was given on 02/15/19. States it doesn't help. Patient seen in our office 02/17/19 received trigger point injections with no relief. Referral was sent to Chronic Pelvic Pain Clinic. Advised at this time will make provider aware and see what they recommend.  Gerarda Gunther, RN

## 2019-02-21 NOTE — Telephone Encounter (Signed)
02/21/2019 Phone call to patient no answer, left message to call writer back. Gerarda Gunther, RN

## 2019-02-21 NOTE — Telephone Encounter (Signed)
Very unlikely that patient has recurrent or persistent PID as she has been treated medically and surgically for this.  She has acute on chronic pelvic pain.  If she wants to be seen in our office before seeing chronic pain clinic that is fine, but she does not need urgent re-evaluation for PID    Laruth Bouchard, MD  OB/GYN Attending  Pager [phone]

## 2019-02-22 ENCOUNTER — Encounter: Payer: Self-pay | Admitting: Emergency Medicine

## 2019-02-22 ENCOUNTER — Emergency Department: Payer: Medicaid Other

## 2019-02-22 ENCOUNTER — Other Ambulatory Visit: Payer: Self-pay

## 2019-02-22 ENCOUNTER — Emergency Department
Admission: EM | Admit: 2019-02-22 | Discharge: 2019-02-22 | Disposition: A | Discharge status: Home / Self Care | Payer: Medicaid Other | Source: Ambulatory Visit | Attending: Emergency Medicine | Admitting: Emergency Medicine

## 2019-02-22 DIAGNOSIS — R102 Pelvic and perineal pain: Secondary | ICD-10-CM

## 2019-02-22 DIAGNOSIS — Z9889 Other specified postprocedural states: Secondary | ICD-10-CM

## 2019-02-22 DIAGNOSIS — R1030 Lower abdominal pain, unspecified: Secondary | ICD-10-CM

## 2019-02-22 DIAGNOSIS — R1031 Right lower quadrant pain: Secondary | ICD-10-CM

## 2019-02-22 DIAGNOSIS — R509 Fever, unspecified: Secondary | ICD-10-CM

## 2019-02-22 DIAGNOSIS — R112 Nausea with vomiting, unspecified: Secondary | ICD-10-CM | POA: Insufficient documentation

## 2019-02-22 DIAGNOSIS — G8929 Other chronic pain: Secondary | ICD-10-CM | POA: Insufficient documentation

## 2019-02-22 DIAGNOSIS — N739 Female pelvic inflammatory disease, unspecified: Secondary | ICD-10-CM

## 2019-02-22 DIAGNOSIS — R109 Unspecified abdominal pain: Secondary | ICD-10-CM

## 2019-02-22 DIAGNOSIS — R1032 Left lower quadrant pain: Secondary | ICD-10-CM | POA: Insufficient documentation

## 2019-02-22 DIAGNOSIS — K921 Melena: Secondary | ICD-10-CM

## 2019-02-22 LAB — PLASMA PROF 7 (ED ONLY)
Anion Gap,PL: 13 (ref 7–16)
CO2,Plasma: 23 mmol/L (ref 20–28)
Chloride,Plasma: 104 mmol/L (ref 96–108)
Creatinine: 0.67 mg/dL (ref 0.51–0.95)
GFR,Black: 133 *
GFR,Caucasian: 116 *
Glucose,Plasma: 104 mg/dL — ABNORMAL HIGH (ref 60–99)
Potassium,Plasma: 3.9 mmol/L (ref 3.4–4.7)
Sodium,Plasma: 140 mmol/L (ref 133–145)
UN,Plasma: 9 mg/dL (ref 6–20)

## 2019-02-22 LAB — CBC AND DIFFERENTIAL
Baso # K/uL: 0 10*3/uL (ref 0.0–0.1)
Basophil %: 0.5 %
Eos # K/uL: 0 10*3/uL (ref 0.0–0.4)
Eosinophil %: 0.5 %
Hematocrit: 30 % — ABNORMAL LOW (ref 34–45)
Hemoglobin: 8.8 g/dL — ABNORMAL LOW (ref 11.2–15.7)
IMM Granulocytes #: 0 10*3/uL (ref 0.0–0.0)
IMM Granulocytes: 0.5 %
Lymph # K/uL: 1.4 10*3/uL (ref 1.2–3.7)
Lymphocyte %: 17.9 %
MCH: 23 pg/cell — ABNORMAL LOW (ref 26–32)
MCHC: 29 g/dL — ABNORMAL LOW (ref 32–36)
MCV: 78 fL — ABNORMAL LOW (ref 79–95)
Mono # K/uL: 0.4 10*3/uL (ref 0.2–0.9)
Monocyte %: 5.6 %
Neut # K/uL: 5.7 10*3/uL (ref 1.6–6.1)
Nucl RBC # K/uL: 0 10*3/uL (ref 0.0–0.0)
Nucl RBC %: 0 /100 WBC (ref 0.0–0.2)
Platelets: 530 10*3/uL — ABNORMAL HIGH (ref 160–370)
RBC: 3.9 MIL/uL (ref 3.9–5.2)
RDW: 16.8 % — ABNORMAL HIGH (ref 11.7–14.4)
Seg Neut %: 75 %
WBC: 7.7 10*3/uL (ref 4.0–10.0)

## 2019-02-22 LAB — URINALYSIS WITH MICROSCOPIC
Ketones, UA: NEGATIVE
Nitrite,UA: NEGATIVE
Protein,UA: NEGATIVE mg/dL
RBC,UA: 1 /hpf (ref 0–2)
Specific Gravity,UA: 1.004 (ref 1.002–1.030)
WBC,UA: 1 /hpf (ref 0–5)
pH,UA: 7 (ref 5.0–8.0)

## 2019-02-22 LAB — HOLD BLUE

## 2019-02-22 LAB — BLOOD BANK HOLD RED

## 2019-02-22 LAB — PREGNANCY TEST, SERUM: Preg,Serum: NEGATIVE

## 2019-02-22 LAB — HOLD GRAY

## 2019-02-22 LAB — BLOOD BANK HOLD LAVENDER

## 2019-02-22 MED ORDER — SODIUM CHLORIDE 0.9 % IV BOLUS *I*
1000.0000 mL | Freq: Once | Status: AC
Start: 2019-02-22 — End: 2019-02-22
  Administered 2019-02-22: 1000 mL via INTRAVENOUS

## 2019-02-22 MED ORDER — HYDROMORPHONE HCL 2 MG/ML IJ SOLN *WRAPPED*
0.5000 mg | Freq: Once | INTRAMUSCULAR | Status: AC
Start: 2019-02-22 — End: 2019-02-22
  Administered 2019-02-22: 0.5 mg via INTRAVENOUS
  Filled 2019-02-22: qty 1

## 2019-02-22 MED ORDER — ONDANSETRON HCL 2 MG/ML IV SOLN *I*
4.0000 mg | Freq: Once | INTRAMUSCULAR | Status: DC
Start: 2019-02-22 — End: 2019-02-22

## 2019-02-22 MED ORDER — IOHEXOL 350 MG/ML (OMNIPAQUE) IV SOLN *I*
1.0000 mL | Freq: Once | INTRAVENOUS | Status: AC
Start: 2019-02-22 — End: 2019-02-22
  Administered 2019-02-22: 125 mL via INTRAVENOUS

## 2019-02-22 MED ORDER — SODIUM CHLORIDE 0.9 % FLUSH FOR PUMPS *I*
0.0000 mL/h | INTRAVENOUS | Status: DC | PRN
Start: 2019-02-22 — End: 2019-02-23

## 2019-02-22 MED ORDER — KETOROLAC TROMETHAMINE 30 MG/ML IJ SOLN *I*
15.0000 mg | Freq: Once | INTRAMUSCULAR | Status: AC
Start: 2019-02-22 — End: 2019-02-22
  Administered 2019-02-22: 15 mg via INTRAVENOUS
  Filled 2019-02-22: qty 1

## 2019-02-22 MED ORDER — PROMETHAZINE HCL 25 MG/ML IJ SOLN *I*
25.0000 mg | Freq: Once | INTRAMUSCULAR | Status: AC
Start: 2019-02-22 — End: 2019-02-22
  Administered 2019-02-22: 25 mg via INTRAVENOUS
  Filled 2019-02-22: qty 1

## 2019-02-22 MED ORDER — ACETAMINOPHEN 500 MG PO TABS *I*
1000.0000 mg | ORAL_TABLET | Freq: Once | ORAL | Status: AC
Start: 2019-02-22 — End: 2019-02-22
  Administered 2019-02-22: 1000 mg via ORAL
  Filled 2019-02-22: qty 2

## 2019-02-22 MED ORDER — DEXTROSE 5 % FLUSH FOR PUMPS *I*
0.0000 mL/h | INTRAVENOUS | Status: DC | PRN
Start: 2019-02-22 — End: 2019-02-23

## 2019-02-22 MED ORDER — STERILE WATER FOR IRRIGATION IR SOLN *I*
900.0000 mL | Freq: Once | Status: AC
Start: 2019-02-22 — End: 2019-02-22
  Administered 2019-02-22: 900 mL via ORAL

## 2019-02-22 MED ORDER — HYDROMORPHONE HCL 2 MG/ML IJ SOLN *WRAPPED*
0.5000 mg | Freq: Once | INTRAMUSCULAR | Status: DC
Start: 2019-02-22 — End: 2019-02-22

## 2019-02-22 NOTE — Provider Consult (Signed)
GYNECOLOGY CONSULTATION HISTORY & PHYSICAL      Consult Requested By: ED  Consult Question: Abdominal pain    HPI: Annette Preston is a 33 y.o. [id number] female who presents for evaluation of abdominal pain.      She was initially admitted to [facility] from 01/28/19 - 02/04/19 with hematemesis and persistent LLQ abdominal pain for which she was treated with abx for PID.  Due to lack of improvement in her pain she underwent diagnostic laparoscopy, left salpingectomy, LOA on 02/03/19. The pathology showed acute and chronic salpingitis.     She was re-admitted on POD #7 from 02/10/19 - 02/13/19 due to failing outpatient treatment of PID, due to ongoing nausea. She was transitioned back to PO antibiotics and pain medication, and completed her 14 day PID treatment regimen while in the hospital. She was ultimately discharged with a referral to pelvic pain clinic for further management of her ongoing pelvic pain without etiology, and recommendation for further outpatient GI work-up for nausea and vomiting.     3/7 ED visit [place]: Persistent pain - Benign abdominal exam, no evidence of infection.    Clinic visit at [facility] on 3/9 at which time evaluation was without identifiable cause for pain; benign exam with no acute source of infection. She received four trigger point injections at that time, with plans to follow up in chronic pelvic pain clinic. It was discussed with the patient that no further narcotics would be prescribed.    3/10 ED visit [place]: GYN team consulted, pelvic US unremarkable. CT abdomen with small amount of nonspecific fat stranding involving the umbilical area    3/74 ED visit [place]: Diagnosed with PID based on exam (CMT), note states"will treat for PID with ceftriaxone/doxycycline". Pt subsequently called [facility] and reported that she was never given prescription for antibiotics.     3/14 ED visit [place]: No change in clinical status, instructed to follow up with OBGYN as outpatient    Today the patient reports persistent LLQ  abdominal pain that has not changed in character since prior to her surgery. She also notes occasional low grade temps at home to 100.71F and some nausea and vomting (none today). Has been taking oxy q6h, tylenol q6h at home with no improvement. Got some IV dilaudid in the ED here with temporary improvement.    Past Medical History     Past Medical History:   Diagnosis Date    Anemia     Depression     GI bleed     hemetesis       Gynecologic History   Menstrual  Patient's last menstrual period was 02/07/2019.  Menses are regular every 28-30 days with mild cramping.     Past Surgical History     Past Surgical History:   Procedure Laterality Date    CESAREAN SECTION, UNSPECIFIED      CHOLECYSTECTOMY      GALLBLADDER SURGERY      PR ESOPHAGOGASTRODUODENOSCOPY TRANSORAL DIAGNOSTIC N/A 01/29/2019    Procedure: EGD (ESOPHAGOGASTRODUODENOSCOPY);  Surgeon: Maryann Alar, MD;  Location: [place];  Service: GI    PR LAP,DIAGNOSTIC ABDOMEN N/A 02/03/2019    Procedure: LAPAROSCOPY, DIAGNOSTIC, EXTENSIVE LYSIS OF ADHESIONS;  Surgeon: Anderson Malta, MD;  Location: [place] [facility];  Service: OBGYN       Obstetric History     OB History   Gravida Para Term Preterm AB Living   5 5 5     5    SAB TAB Ectopic Multiple Live Births  5      # Outcome Date GA Lbr Len/2nd Weight Sex Delivery Anes PTL Lv   5 Term 2015           4 Term 2013     CS-Unspec      3 Term 2010     CS-Unspec      2 Term 2009     CS-Unspec      1 Term      CS-Unspec          Social History     Social History     Tobacco Use    Smoking status: Never Smoker    Smokeless tobacco: Never Used   Substance Use Topics    Alcohol use: Not Currently     Frequency: Never    Drug use: Never       Family History     Family History            Breast cancer Maternal Grandmother    Colon cancer Paternal Grandmother          Allergies     Allergies   Allergen Reactions    Gabapentin Anaphylaxis       Current Home Medications   Medication  reconciliation performed at the time of evaluation. See Med Rec portion of patient's chart.       Review of Systems  All other ROS negative unless otherwise noted in HPI.       Physical Exam     Vitals:    02/22/19 1427 02/22/19 1755   BP: (!) 152/107 (!) 153/96   BP Location: Left arm Left arm   Pulse: 104 98   Resp: 18 16   Temp: 36 C (96.8 F) 36.6 C (97.9 F)   TempSrc: Temporal Temporal   SpO2: 100% 100%   Weight: 91.6 kg (202 lb)    Height: 1.524 m (5')        General: well developed and well nourished, alert and oriented, in no acute distress  Mental: alert and oriented  HEENT: Normocephalic, atraumatic  Cardiovascular: Warm and well perfused, normal rate  Respiratory: Breathing comfortably on RA  Abdomen: Soft. Mild tenderness to palpation of LLQ with voluntary guarding. No peritoneal signs; no pain with straight leg raise. No rebound. No masses.  Extremities/Skin: No edema noted    Labs   All labs reviewed and notable for:    WBC 7.7  Serum bHCG neg    Imaging     CTA 02/22/2019 Impression  The uterus appears adhesed to the low ventral abdominal wall with hyperenhancing tissue extending into the rectus abdominous raising possibility of endometrial implants here in addition to scarring.      There is fat stranding within the low pelvic fat just below the sigmoid colon. This is new compared to the prior exam, likely infectious/inflammatory, could be due to PID in the appropriate clinical scenario. No discrete abscess here.      Small low-attenuation structures in the left ovary measuring up to 1.2 cm are statistically small physiologic cysts, minimally more prominent compared to the prior exam. Much less likely possibility of microabscess here not ruled out in patient with    history of PID.       Assessment & Recommendations     Annette Preston is a 33 y.o. [id number] female who presents with chronic lower abdominal pain. She has been seen in the ED multiple times in the past week with no idenitifiable reason  for  her pain. CT today shows some fat stranding in the left adnexa which is unchanged from prior CT 2/29. She is clinically well-appearing with normal vitals, no white count and a non surgical abdominal exam. Pain likely related to her chronic pelvic pain, as well as the extensive intraabdominal adhesive disease noted at the time of  Surgery. Discussed adjustment of expectation that she will have zero pain. Will take time for her pain to improve, and the goal is for her pain to be manageable. Reassured her that there is no indication for acute intervention at this time.     Referrals had been made for her to follow up with GI for her chronic nausea/vomiting and with the chronic pelvic pain clinic.     She can call the office on Monday morning to get an appointment to be seen earlier than her scheduled appt if she feels appropriate.    Pt seen with Dr. Shanon Rosser and Dr. Threasa Beards, MD  Obstetrics & Gynecology, PGY-1  PGR [phone]

## 2019-02-22 NOTE — Discharge Instructions (Addendum)
You were seen at [facility] Emergency Department for abdominal pain. Your CT scan was reassuring and you were evaluated by the OB/Gyn team. It was then determined that you are safe to be discharged home. Please follow up with your OB/Gyn as previously scheduled and please follow up with the pelvic pain clinic.     Please see your primary care physician or return to the Emergency Department if you develop any of the following signs or symptoms:  -Inability to tolerate food or drink  -Development of fevers  -Any other concerning symptoms.

## 2019-02-22 NOTE — ED Provider Notes (Addendum)
History     Chief Complaint   Patient presents with    Abdominal Pain     Pt is a 33 y/o female with a history of PID s/p laparoscopic salpingectomy on 02/03/19, re-admitted from 3/2 - 3/5 for ongoing pain, presenting for worsening pain that started yesterday. She says that she is experiencing pain in the LLQ and RLQ, untouched by oxycodone. She is experiencing nausea, vomiting, and diarrhea and has noticed a bit of bright red blood in her vomit and diarrhea. She also has been having a white vaginal discharge, fever, and chills. She denies burning or itching on urination, URI symptoms.   Of note, patient has multiple ED visits over the past 1 month and has required readmission for pain control.  Patient has been to the OB/GYN clinic for trigger point injections and has not yet followed up with chronic pelvic pain clinic.      History provided by:  Patient      Medical/Surgical/Family History     Past Medical History:   Diagnosis Date    Anemia     Depression     GI bleed     hemetesis        Patient Active Problem List   Diagnosis Code    Anemia D64.9    Abdominal pain, unspecified abdominal location R10.9    TOA (tubo-ovarian abscess) N70.93    s/p laparoscopic LS with extensive LOA Z98.890    Abdominal pain R10.9    Pelvic inflammatory disease N73.9    Pelvic pain R10.2            Past Surgical History:   Procedure Laterality Date    CESAREAN SECTION, UNSPECIFIED      CHOLECYSTECTOMY      GALLBLADDER SURGERY      PR ESOPHAGOGASTRODUODENOSCOPY TRANSORAL DIAGNOSTIC N/A 01/29/2019    Procedure: EGD (ESOPHAGOGASTRODUODENOSCOPY);  Surgeon: Maryann Alar, MD;  Location: [facility];  Service: GI    PR LAP,DIAGNOSTIC ABDOMEN N/A 02/03/2019    Procedure: LAPAROSCOPY, DIAGNOSTIC, EXTENSIVE LYSIS OF ADHESIONS;  Surgeon: Thompsoncarrillo, Raquel Sarna, MD;  Location: [facility];  Service: OBGYN     Family History   Problem Relation Age of Onset    Colon cancer Paternal Grandmother     Breast cancer  Maternal Grandmother 41          Social History     Tobacco Use    Smoking status: Never Smoker    Smokeless tobacco: Never Used   Substance Use Topics    Alcohol use: Not Currently     Frequency: Never    Drug use: Never     Living Situation     Questions Responses    Patient lives with Family    Homeless     Caregiver for other family member     External Services     Employment Employed    Domestic Violence Risk                 Review of Systems   Review of Systems   Constitutional: Positive for chills and fever.   Eyes: Negative for visual disturbance.   Respiratory: Negative for cough, chest tightness and shortness of breath.    Cardiovascular: Negative for chest pain.   Gastrointestinal: Positive for abdominal pain, blood in stool, nausea and vomiting.   Genitourinary: Positive for vaginal discharge. Negative for difficulty urinating and dysuria.   Musculoskeletal: Negative for back pain.   Skin: Negative for rash and wound.  Allergic/Immunologic: Negative for immunocompromised state.   Neurological: Negative for dizziness, seizures, syncope, numbness and headaches.   Psychiatric/Behavioral: Negative for confusion.       Physical Exam     Triage Vitals  Triage Start: Start, (02/22/19 1425)   First Recorded BP: (!) 152/107, Resp: 18, Temp: 36 C (96.8 F), Temp src: TEMPORAL Oxygen Therapy SpO2: 100 %, Oximetry Source: Rt Hand, O2 Device: None (Room air), Heart Rate: 104, (02/22/19 1427)  .  First Pain Reported  0-10 Scale: 10, Pain Location/Orientation: Abdomen, (02/22/19 1427)       Physical Exam  Vitals signs and nursing note reviewed. Exam conducted with a chaperone present.   Constitutional:       General: She is not in acute distress.     Appearance: She is well-developed and normal weight.   HENT:      Head: Normocephalic and atraumatic.   Cardiovascular:      Rate and Rhythm: Normal rate and regular rhythm.      Heart sounds: Normal heart sounds. No murmur.   Pulmonary:      Effort: Pulmonary effort  is normal. No respiratory distress.      Breath sounds: Normal breath sounds.   Abdominal:      General: Abdomen is flat.      Palpations: Abdomen is soft.      Tenderness: There is abdominal tenderness (Severe tenderness to palpation of the LLQ and RLQ).   Neurological:      General: No focal deficit present.      Mental Status: She is alert and oriented to person, place, and time.      Cranial Nerves: No cranial nerve deficit.      Motor: No weakness.   Psychiatric:         Mood and Affect: Mood normal. Mood is not anxious or depressed.         Behavior: Behavior normal.         Medical Decision Making   Patient seen by me on:  02/22/2019    Assessment:  33 year old female with a history of PID status post laparoscopic salpingectomy on 2/24, chronic abdominal pain, and chronic hematemesis who presents emergency department for worsening abdominal pain and continuing vaginal discharge.  Patient's physical exam is notable for severe tenderness to palpation in the left lower quadrant as well as right lower quadrant which she says is worse than usual.  Given the patient's recent surgery this is concerning for possible postoperative complication and so a CT scan will be ordered to evaluate for possible intra-abdominal pathology.  Patient has a history of chronic abdominal pain and so this also likely represents an acute exacerbation.   There is low concern for continuing PID or STD as the patient has completed a full outpatient course of antibiotics.  Patient also had recent negative GC and chlamydia swabs and so these will not be retested at this time.  Patient complains of bright red blood per rectum as well as hematemesis.  There is a long history of this and patient is being evaluated as an outpatient.  She will be evaluated for possible anemia secondary to bleeding but no acute intervention will be necessary at this time.    Differential diagnosis:  Chronic abdominal pain, postoperative complication, PID,  STD    Plan:  Orders Placed This Encounter      CT abdomen and pelvis with contrast      CBC and differential      Plasma  profile 7 [facility] ED only)      Pregnancy Test, Serum      Urinalysis with Microscopic UA      Hold blue      Blood bank hold lavender      Blood bank hold red      Hold gray      Vaginal exam set up      sodium chloride 0.9 % bolus 1,000 mL      promethazine (PHENERGAN) 25 MG/ML injection 25 mg      acetaminophen (TYLENOL) tablet 1,000 mg      ketorolac (TORADOL) 30 mg/mL injection 15 mg      iohexol (OMNIPAQUE) 300 mg/mL oral solution 42mL/870mL Water      HYDROmorphone (DILAUDID) injection 0.5 mg      iohexol (OMNIPAQUE) 350 MG/ML injection 1-150 mL    Independent review of: Existing labs, CT scans    ED Course and Disposition:    Update:  Patient CT scan is concerning for new fat stranding concerning for inflammation/infection and so OB/GYN was consulted.  OB/GYN has evaluated the patient in the CT scan and believe that this is normal postoperative change and that, as the patient is vital signs stable and her symptoms are chronic in nature she does not require further evaluation.  Patient's laboratory evaluation is within normal limits including an H&H which is at her baseline.  It was then determined that the patient is safe to be discharged home to follow-up with OB/GYN and pelvic pain clinic.              Myer Haff, MD      Resident Attestation:    Patient seen by me on 02/22/2019.    I saw and evaluated the patient. I agree with the resident's/fellow's findings and plan of care as documented above.  Author:  Virl Diamond, MD       Myer Haff, MD  Resident  02/23/19 1141       Randolf Sansoucie, Simeon Craft, MD  02/23/19 [id number]

## 2019-02-22 NOTE — ED Notes (Signed)
Patient presents to the ED with lower abd pain that has been getting progressively worse since having her left fallopian tube removed last month. Patient also endorsing n/v and fevers/chills. Denies SOB, CP, and vaginal bleeding. Does endorse abnormal white vaginal discharge. Patient's husband at bedside and supportive.

## 2019-02-22 NOTE — ED Notes (Signed)
Discharge instructions and follow-up care reviewed.  Patient verbalized understanding.  Appropriate clothing in place and all belongings in patient's possession.  Vital signs stable and IV removed.  Patient's significant other is driving her home and she states that she has access to her residence.

## 2019-02-22 NOTE — First Provider Contact (Signed)
ED First Provider Contact Note    Initial provider evaluation performed by   ED First Provider Contact     Date/Time Event User Comments    02/22/19 1426 ED First Provider Contact Betsabe Iglesia C Initial Face to Face Provider Contact          Vital signs reviewed.    Assessment: 33 yo F presents with acute on chronic LLQ/pelvic pain . Reports L sided salpingectomy a month ago due to PID.     Orders placed:  LABS     Patient requires further evaluation.     [place], [place], 02/22/2019, 2:29 PM     Sanda Linger, PA  02/22/19 1430

## 2019-02-22 NOTE — ED Triage Notes (Addendum)
Pt to ed with c/o Left sided abdominal pain, fevers, nausea/ emesis, states she had her left sided fallopian tube removed about a month ago       Triage Note   Olevia Bowens, RN

## 2019-02-22 NOTE — ED Notes (Signed)
Report Given To  Jarrett Soho, RN      Descriptive Sentence / Reason for Admission   Patient presents to the ED with lower abd pain that has been getting progressively worse since having her left fallopian tube removed last month. Patient also endorsing n/v and fevers/chills. Denies SOB, CP, and vaginal bleeding. Does endorse abnormal white vaginal discharge. Patient's husband at bedside and supportive      Active Issues / Relevant Events   A&Ox4  Ambulatory  CT showed uterus adhered to lo ventral abd wall with tissue extending to rectum, possibly scarring, possibly PID      To Do List  VS/A q4  Meds per [facility]  Pain control       Anticipatory Guidance / Discharge Planning  Pending ED workup

## 2019-02-22 NOTE — ED Notes (Signed)
Assumed care of patient, appears in no acute distress, resting comfortably at this time.  Will continue to monitor and treat per orders.

## 2019-02-23 LAB — UNMAPPED LAB RESULTS
Basophil # (HT): 0 10 3/uL — NL (ref 0.0–0.2)
Basophil % (HT): 0 % — NL (ref 0–2)
Eosinophil # (HT): 0.1 10 3/uL — NL (ref 0.0–0.5)
Eosinophil % (HT): 1 % — NL (ref 0–7)
Hematocrit (HT): 27 % — ABNORMAL LOW (ref 40–52)
Hemoglobin (HGB) (HT): 8.1 g/dL — ABNORMAL LOW (ref 11.5–16.0)
Lymphocyte # (HT): 1.9 10 3/uL — NL (ref 0.9–3.8)
Lymphocyte % (HT): 24 % — NL (ref 17–44)
MCHC (HT): 30.1 g/dL — ABNORMAL LOW (ref 32.0–36.0)
MCV (HT): 78 fL — ABNORMAL LOW (ref 81–99)
Mean Corpuscular Hemoglobin (MCH) (HT): 23.5 pg — ABNORMAL LOW (ref 26.0–34.0)
Monocyte # (HT): 0.5 10 3/uL — NL (ref 0.2–1.0)
Monocyte % (HT): 6 % — NL (ref 4–15)
Neutrophil # (HT): 5.2 10 3/uL — NL (ref 1.5–7.7)
Platelets (HT): 444 10 3/uL — ABNORMAL HIGH (ref 140–400)
RBC (HT): 3.44 10 6/uL — ABNORMAL LOW (ref 3.80–5.20)
RDW (HT): 17 % — ABNORMAL HIGH (ref 11.5–15.0)
Seg Neut % (HT): 68 % — NL (ref 43–75)
WBC (HT): 7.6 10 3/uL — NL (ref 4.0–10.0)

## 2019-02-26 ENCOUNTER — Emergency Department
Admission: EM | Admit: 2019-02-26 | Discharge: 2019-02-26 | Disposition: A | Discharge status: Home / Self Care | Payer: Medicaid Other | Source: Ambulatory Visit | Attending: Emergency Medicine | Admitting: Emergency Medicine

## 2019-02-26 ENCOUNTER — Encounter: Payer: Self-pay | Admitting: Emergency Medicine

## 2019-02-26 DIAGNOSIS — G8918 Other acute postprocedural pain: Secondary | ICD-10-CM | POA: Insufficient documentation

## 2019-02-26 DIAGNOSIS — R0682 Tachypnea, not elsewhere classified: Secondary | ICD-10-CM

## 2019-02-26 DIAGNOSIS — R109 Unspecified abdominal pain: Secondary | ICD-10-CM

## 2019-02-26 DIAGNOSIS — I10 Essential (primary) hypertension: Secondary | ICD-10-CM | POA: Insufficient documentation

## 2019-02-26 DIAGNOSIS — R1032 Left lower quadrant pain: Secondary | ICD-10-CM

## 2019-02-26 DIAGNOSIS — R112 Nausea with vomiting, unspecified: Secondary | ICD-10-CM | POA: Insufficient documentation

## 2019-02-26 DIAGNOSIS — R1031 Right lower quadrant pain: Secondary | ICD-10-CM

## 2019-02-26 LAB — PLASMA PROF 7 (ED ONLY)
Anion Gap,PL: 13 (ref 7–16)
CO2,Plasma: 25 mmol/L (ref 20–28)
Chloride,Plasma: 104 mmol/L (ref 96–108)
Creatinine: 0.61 mg/dL (ref 0.51–0.95)
GFR,Black: 138 *
GFR,Caucasian: 119 *
Glucose,Plasma: 100 mg/dL — ABNORMAL HIGH (ref 60–99)
Potassium,Plasma: 4 mmol/L (ref 3.4–4.7)
Sodium,Plasma: 142 mmol/L (ref 133–145)
UN,Plasma: 8 mg/dL (ref 6–20)

## 2019-02-26 LAB — RUQ PANEL (ED ONLY)
ALT: 21 U/L (ref 0–35)
AST: 21 U/L (ref 0–35)
Albumin: 4.4 g/dL (ref 3.5–5.2)
Alk Phos: 58 U/L (ref 35–105)
Amylase: 43 U/L (ref 28–100)
Bilirubin,Direct: <0.2 mg/dL (ref 0.0–0.3)
Bilirubin,Total: 0.2 mg/dL (ref 0.0–1.2)
Lipase: 32 U/L (ref 13–60)
Total Protein: 7.6 g/dL (ref 6.3–7.7)

## 2019-02-26 LAB — CBC AND DIFFERENTIAL
Baso # K/uL: 0 10*3/uL (ref 0.0–0.1)
Basophil %: 0.3 %
Eos # K/uL: 0.1 10*3/uL (ref 0.0–0.4)
Eosinophil %: 0.9 %
Hematocrit: 30 % — ABNORMAL LOW (ref 34–45)
Hemoglobin: 8.7 g/dL — ABNORMAL LOW (ref 11.2–15.7)
IMM Granulocytes #: 0 10*3/uL (ref 0.0–0.0)
IMM Granulocytes: 0.1 %
Lymph # K/uL: 1.6 10*3/uL (ref 1.2–3.7)
Lymphocyte %: 22.4 %
MCH: 22 pg/cell — ABNORMAL LOW (ref 26–32)
MCHC: 29 g/dL — ABNORMAL LOW (ref 32–36)
MCV: 77 fL — ABNORMAL LOW (ref 79–95)
Mono # K/uL: 0.4 10*3/uL (ref 0.2–0.9)
Monocyte %: 5 %
Neut # K/uL: 5 10*3/uL (ref 1.6–6.1)
Nucl RBC # K/uL: 0 10*3/uL (ref 0.0–0.0)
Nucl RBC %: 0 /100 WBC (ref 0.0–0.2)
Platelets: 445 10*3/uL — ABNORMAL HIGH (ref 160–370)
RBC: 3.9 MIL/uL (ref 3.9–5.2)
RDW: 16.9 % — ABNORMAL HIGH (ref 11.7–14.4)
Seg Neut %: 71.3 %
WBC: 7 10*3/uL (ref 4.0–10.0)

## 2019-02-26 LAB — HOLD BLUE

## 2019-02-26 LAB — PREGNANCY TEST, SERUM: Preg,Serum: NEGATIVE

## 2019-02-26 MED ORDER — DEXTROSE 5 % FLUSH FOR PUMPS *I*
0.0000 mL/h | INTRAVENOUS | Status: DC | PRN
Start: 2019-02-26 — End: 2019-02-27

## 2019-02-26 MED ORDER — ONDANSETRON HCL 2 MG/ML IV SOLN *I*
4.0000 mg | Freq: Once | INTRAMUSCULAR | Status: DC
Start: 2019-02-26 — End: 2019-02-27
  Filled 2019-02-26: qty 2

## 2019-02-26 MED ORDER — SODIUM CHLORIDE 0.9 % FLUSH FOR PUMPS *I*
0.0000 mL/h | INTRAVENOUS | Status: DC | PRN
Start: 2019-02-26 — End: 2019-02-27

## 2019-02-26 NOTE — Discharge Instructions (Signed)
You were seen in the emergency department.   Your physical exam and clinical work up are reassuring.   Please make sure you call your primary care doctor and GYN to make a follow up appointment.  A referral has been placed for you to see the pain medicine doctor, they will call you to set up a follow-up appointment.  Return to the ED for anything else you are concerned about.

## 2019-02-26 NOTE — First Provider Contact (Signed)
ED First Provider Contact Note    Initial provider evaluation performed by   ED First Provider Contact     Date/Time Event User Comments    02/26/19 1318 ED First Provider Contact Miller [place] M Initial Face to Face Provider Contact        Annette Preston is a 33 y.o. female who presents to the emergency department with HTN, abdominal pain, nausea, chest pain and SOB.     Had left fallopian tube removed 3/2 for pyelosalpinx. Has been having significant post operative pain and has been seen for this in the ED multiple times. Went to OB/GYN recently and found to be hypertensive. Was started on BP medication but continues with elevated blood pressures, lower abdominal pain and nausea. Also endorsing minimal CP and SOB with exertion.     Afebrile at triage.     Vital signs reviewed.  Vitals:    02/26/19 1314   BP: 146/87   Pulse: (!) 117   Resp: 18   Temp: 36.6 C (97.9 F)   Weight: 96.6 kg (213 lb)   Height: 1.524 m (5')      Assessment: post operative pain, nausea, vomiting    Orders placed:  EKG and LABS     Patient requires further evaluation.     Vania Rea, [place], 02/26/2019, 1:18 PM     Vania Rea, [place]  02/26/19 1326

## 2019-02-26 NOTE — ED Provider Notes (Signed)
History     Chief Complaint   Patient presents with    Abdominal Pain    Chest Pain     Annette Preston is a 33 y.o. female with chronic lower abdominal pain presents for abdominal pain. Patient states she has had ongoing bilateral lower quadrant abdominal pain for weeks. She has been taking tylenol with no relief.  The pain is no different from prior.  She states that it is severe.  She saw her GYN doctor on Monday and got shots in her left lower quadrant which she felt like were not helpful at all.  She was also started on an oral antihypertensive which she has been taking for 2 days but she still feels like she is too hypertensive.  She does endorse some nausea and vomiting.  No diarrhea or constipation.  No vaginal discharge.  Triage note, commented on chest pain.  Patient denies chest pain. She states her only pain is in her lower abdominal, as it chronically is.          Medical/Surgical/Family History     Past Medical History:   Diagnosis Date    Anemia     Depression     GI bleed     hemetesis        Patient Active Problem List   Diagnosis Code    Anemia D64.9    Abdominal pain, unspecified abdominal location R10.9    TOA (tubo-ovarian abscess) N70.93    s/p laparoscopic LS with extensive LOA Z98.890    Abdominal pain R10.9    Pelvic inflammatory disease N73.9    Pelvic pain R10.2            Past Surgical History:   Procedure Laterality Date    CESAREAN SECTION, UNSPECIFIED      CHOLECYSTECTOMY      GALLBLADDER SURGERY      PR ESOPHAGOGASTRODUODENOSCOPY TRANSORAL DIAGNOSTIC N/A 01/29/2019    Procedure: EGD (ESOPHAGOGASTRODUODENOSCOPY);  Surgeon: Maryann Alar, MD;  Location: [facility];  Service: GI    PR LAP,DIAGNOSTIC ABDOMEN N/A 02/03/2019    Procedure: LAPAROSCOPY, DIAGNOSTIC, EXTENSIVE LYSIS OF ADHESIONS;  Surgeon: Thompsoncarrillo, Raquel Sarna, MD;  Location: [facility];  Service: OBGYN     Family History   Problem Relation Age of Onset    Colon cancer Paternal Grandmother      Breast cancer Maternal Grandmother 50          Social History     Tobacco Use    Smoking status: Never Smoker    Smokeless tobacco: Never Used   Substance Use Topics    Alcohol use: Not Currently     Frequency: Never    Drug use: Never     Living Situation     Questions Responses    Patient lives with Family    Homeless     Caregiver for other family member     External Services     Employment Employed    Domestic Violence Risk                 Review of Systems   Review of Systems   Constitutional: Negative for fever.   HENT: Negative for trouble swallowing.    Eyes: Negative for visual disturbance.   Respiratory: Negative for shortness of breath.    Cardiovascular: Negative for chest pain.   Gastrointestinal: Positive for abdominal pain.   Genitourinary: Negative for dysuria.   Musculoskeletal: Negative for gait problem.   Skin: Negative for color  change.   Neurological: Negative for headaches.   Hematological: Does not bruise/bleed easily.   Psychiatric/Behavioral: Negative for agitation.       Physical Exam     Triage Vitals  Triage Start: Start, (02/26/19 1316)   First Recorded BP: 146/87, Resp: 18, Temp: 37.1 C (98.8 F)(98.9), Temp src: Oral Oxygen Therapy SpO2: 99 %, Oximetry Source: Rt Hand, O2 Device: None (Room air), Heart Rate: (!) 117, (02/26/19 1314)  .  First Pain Reported  0-10 Scale: 10, (02/26/19 1314)       Physical Exam  Vitals signs and nursing note reviewed.   Constitutional:       Appearance: She is well-developed. She is not diaphoretic.      Comments: Does not appear acutely toxic   HENT:      Head: Normocephalic and atraumatic.      Right Ear: External ear normal.      Left Ear: External ear normal.      Nose: Nose normal.   Eyes:      Conjunctiva/sclera: Conjunctivae normal.   Neck:      Musculoskeletal: Normal range of motion and neck supple.   Cardiovascular:      Rate and Rhythm: Regular rhythm. Tachycardia present.      Heart sounds: Normal heart sounds.   Pulmonary:      Effort:  Pulmonary effort is normal. No respiratory distress.      Breath sounds: Normal breath sounds. No stridor. No wheezing or rales.   Abdominal:      General: Bowel sounds are normal. There is no distension.      Palpations: Abdomen is soft.      Tenderness: There is no guarding or rebound.      Comments: Endorses lower abdominal tenderness but is distractible on exam   Musculoskeletal:         General: No deformity.   Skin:     General: Skin is warm and dry.   Neurological:      Mental Status: She is alert.      Motor: No abnormal muscle tone.      Coordination: Coordination normal.   Psychiatric:         Behavior: Behavior normal.         Thought Content: Thought content normal.         Judgment: Judgment normal.         Medical Decision Making   Patient seen by me on:  02/26/2019    Assessment:  Annette Preston is a 33 y.o. female presents for chronic lower abdominal pain without new features. Abdomen is not peritonitic. Labs were resulted at the time of my evaluation.  No evidence of anemia, dehydration, electrolyte abnormalities, acute kidney injury, uremia, pregnancy, or hepatobiliary disease.  Given her age, numerous previous scans especially with the same presentation and duration of pain, do not believe that any imaging, especially CT imaging, will be helpful. Will refer to pain management. She has an appointment with her OB in 2 days.     Differential diagnosis:  As above    Plan:   Orders Placed This Encounter      CBC and differential      Plasma profile 7 [facility] ED only)      RUQ panel      HCG, serum qualitative, pregnancy      Hold blue      ED/UC REFERRAL TO PAIN MEDICINE      Insert peripheral IV  Toy Care, MD          Toy Care, MD  02/26/19 1901

## 2019-02-26 NOTE — ED Triage Notes (Signed)
Notes elevated BP in OB/GYN office on Monday. States has pain in chest and across lower abdomen today. Also admits to mild shortness of breath.       Triage Note   Jola Baptist, RN

## 2019-02-26 NOTE — ED Notes (Signed)
Pt verbalized understanding of discharge instructions. Pt will follow up with PCP, GYN, and pain management doctor. Pt ambulated to discharge, husband picking up pt to bring home. In appropriate clothing, IV removed.

## 2019-02-26 NOTE — ED Notes (Cosign Needed)
WAITING ROOM CHECK    Patient visualized in waiting room. Patient appears uncomfortable. Patient's chart reviewed and most recent vital signs reviewed. Will continue to monitor.

## 2019-02-27 ENCOUNTER — Emergency Department
Admission: EM | Admit: 2019-02-27 | Discharge: 2019-02-27 | Disposition: A | Discharge status: Home / Self Care | Payer: Medicaid Other | Source: Ambulatory Visit | Attending: Emergency Medicine | Admitting: Emergency Medicine

## 2019-02-27 DIAGNOSIS — I10 Essential (primary) hypertension: Secondary | ICD-10-CM | POA: Insufficient documentation

## 2019-02-27 DIAGNOSIS — R079 Chest pain, unspecified: Secondary | ICD-10-CM | POA: Insufficient documentation

## 2019-02-27 DIAGNOSIS — R1032 Left lower quadrant pain: Secondary | ICD-10-CM | POA: Insufficient documentation

## 2019-02-27 NOTE — Discharge Instructions (Signed)
You were seen in the Emergency Department for chest pain and abdominal pain. It was determined that there was no need for hospitalization at this time and the best course of action is follow up with your primary doctor 24-48 hours.   Instructions: Follow up with your primary doctor in 24 - 48 hours. Please take an over the counter pain medication for pain as well as use ice or heating pad for comfort to the affected area. You should always discuss new medications with your primary care physician.   Return to the Emergency Department if your symptoms persist or worsen, especially if you experience severe increase in pain, shortness of breath, chest pain, headache, changes in vision, nausea, vomiting, fever, numbness, inability to walk, inability to eat or drink, or changes in behavior. Also please feel free to return to the ED for reevaluation at anytime you feel the condition warrants such a visit or you are unable contact your primary care doctor.

## 2019-02-27 NOTE — ED Provider Notes (Signed)
History     Chief Complaint   Patient presents with    Chest Pain    Abdominal Pain    Hypertension     33 year old female presenting to the ED with chief complaint of chest pain and abdominal pain.  Patient states that [facility].  Patient states they did not do anything.  Per chart review patient did have a laboratory evaluation as well as a physical examination by a provider with a comprehensive work-up showing no acute problems.  Patient states that nothing is changed since yesterday.  Patient states she was told to come here by her doctor.  Patient states she has pain in her lower abdomen but has had pain in her lower abdomen for a long time due to her fallopian tube removal.  Patient states the chest pain is also chronic.  Patient denies any nausea or vomiting.  Patient denies any numbness or weakness peer patient denies any chills but states she may have had a fever.  Patient denies any cough or cough anything up.  Patient denies any viral illness.  Patient denies any vaginal bleeding or discharge.  Patient denies any recent illnesses.      History provided by:  Patient      Medical/Surgical/Family History     Past Medical History:   Diagnosis Date    Anemia     Depression     GI bleed     hemetesis        Patient Active Problem List   Diagnosis Code    Anemia D64.9    Abdominal pain, unspecified abdominal location R10.9    TOA (tubo-ovarian abscess) N70.93    s/p laparoscopic LS with extensive LOA Z98.890    Abdominal pain R10.9    Pelvic inflammatory disease N73.9    Pelvic pain R10.2            Past Surgical History:   Procedure Laterality Date    CESAREAN SECTION, UNSPECIFIED      CHOLECYSTECTOMY      GALLBLADDER SURGERY      PR ESOPHAGOGASTRODUODENOSCOPY TRANSORAL DIAGNOSTIC N/A 01/29/2019    Procedure: EGD (ESOPHAGOGASTRODUODENOSCOPY);  Surgeon: Maryann Alar, MD;  Location: [facility];  Service: GI    PR LAP,DIAGNOSTIC ABDOMEN N/A 02/03/2019     Procedure: LAPAROSCOPY, DIAGNOSTIC, EXTENSIVE LYSIS OF ADHESIONS;  Surgeon: Thompsoncarrillo, Raquel Sarna, MD;  Location: [place] [facility];  Service: OBGYN     Family History   Problem Relation Age of Onset    Colon cancer Paternal Grandmother     Breast cancer Maternal Grandmother 85          Social History     Tobacco Use    Smoking status: Never Smoker    Smokeless tobacco: Never Used   Substance Use Topics    Alcohol use: Not Currently     Frequency: Never    Drug use: Never     Living Situation     Questions Responses    Patient lives with Family    Homeless     Caregiver for other family member     External Services     Employment Employed    Domestic Violence Risk                 Review of Systems   Review of Systems   Constitutional: Negative for activity change, appetite change, chills and fever.   HENT: Negative for congestion, rhinorrhea, sore throat and trouble swallowing.  Eyes: Negative for pain and visual disturbance.   Respiratory: Negative for cough, chest tightness, shortness of breath and wheezing.    Cardiovascular: Negative for chest pain.   Gastrointestinal: Negative for abdominal pain, nausea and vomiting.   Genitourinary: Negative for dysuria.   Musculoskeletal: Negative for back pain, neck pain and neck stiffness.   Skin: Negative for rash.   Neurological: Negative for dizziness, seizures, syncope, weakness, light-headedness, numbness and headaches.   Psychiatric/Behavioral: Negative for agitation.       Physical Exam     Triage Vitals  Triage Start: Start, (02/27/19 1731)   First Recorded BP: (!) 170/108, Resp: 16, Temp: 36.8 C (98.2 F), Temp src: TEMPORAL Oxygen Therapy SpO2: 99 %, O2 Device: None (Room air), Heart Rate: 107, (02/27/19 1722)  .  First Pain Reported  0-10 Scale: 10, Pain Location/Orientation: Abdomen;Chest, (02/27/19 1722)       Physical Exam  Vitals signs and nursing note reviewed.   Constitutional:       Appearance: She is obese. She is not ill-appearing or diaphoretic.    HENT:      Head: Normocephalic and atraumatic.   Neck:      Musculoskeletal: Normal range of motion and neck supple.   Cardiovascular:      Rate and Rhythm: Normal rate and regular rhythm.      Pulses: Normal pulses.      Heart sounds: Normal heart sounds. No murmur. No friction rub. No gallop.    Pulmonary:      Effort: Pulmonary effort is normal. No respiratory distress.      Breath sounds: Normal breath sounds. No wheezing, rhonchi or rales.   Abdominal:      Palpations: Abdomen is soft.      Tenderness: There is abdominal tenderness.      Comments: Mild tenderness in the left lower quadrant in the area the patient reports is her chronic pain.   Musculoskeletal:         General: No swelling or tenderness.   Skin:     General: Skin is warm and dry.   Neurological:      General: No focal deficit present.      Mental Status: She is alert and oriented to person, place, and time.      Motor: No weakness.      Coordination: Coordination normal.      Gait: Gait normal.   Psychiatric:         Mood and Affect: Mood normal.         Speech: Speech normal.         Behavior: Behavior normal.         Thought Content: Thought content does not include homicidal or suicidal ideation.         Medical Decision Making   Patient seen by me on:  02/27/2019    Assessment:  33 year old female presenting to the ED with chief complaint of abdominal pain and chest pain as well as a fever yesterday in the setting of having a recent work-up with similar chronic complaints all of which had a work-up yesterday that did not show any acute cause presenting today with no change in symptoms or any change from her story yesterday stating she was told to come here by her doctor.    Differential diagnosis:  Chronic abdominal pain, viral illness, unlikely acute abdominal etiology or ACS in the setting of patient's clinical exam and history as well as chronic nature of patient's symptoms.  Plan:  Patient's labs were reviewed from last night there  was no acute findings at that time.  Patient again today does not have any acute findings no change in her symptoms all of which are from her chronic complaints patient does have some mildly elevated blood pressure but is currently starting a new blood pressure medication.  Patient does appear very stable and can follow-up with her primary care physician, Reassurance, Reevaluation. Pt will be d/c home in the setting of normal work up and resolution of symptoms. Pt will be encouraged to follow up with their PCP for further evaluation of continuing symptoms.               [address], DO          [place], [place], DO  02/27/19 1756

## 2019-02-27 NOTE — ED Triage Notes (Signed)
To ED for c/o abd pain, fevers, chills, body aches and cp. Denies any SOB.       Triage Note   Phillip Heal, RN

## 2019-02-27 NOTE — ED Provider Progress Notes (Signed)
ED Provider Progress Note    Patient's examination and review of patient's laboratories from yesterday as well as patient's history today and patient's history yesterday there does not appear to be an acute etiology of patient's symptoms patient is encouraged to follow-up with her primary care physician.  Patient is stable and ready for discharge.  Patient be discharged home.  Patient understands and agrees with this discharge plan.  Patient will be discharged home.          [place], DO, 02/27/2019, 5:42 PM     [place], [place], DO  02/27/19 1757

## 2019-02-27 NOTE — ED Triage Notes (Addendum)
Patient sent in by PCP for CP, HTN and abdomen pain. Patient stated on antihypertensive medication on Monday. CP stated on Tuesday, patient states sharp constant 7/10 pain.   [facility] to [facility] and was released yesterday with no EKG or blood work. Patient reports N/V/D , fever and chills yesterday. Patient sent for triage outside due to symptoms.      Triage Note   Lawernce Pitts, RN

## 2019-02-28 LAB — UNMAPPED LAB RESULTS
Basophil # (HT): 0 10 3/uL — NL (ref 0.0–0.2)
Basophil % (HT): 1 % — NL (ref 0–3)
Eosinophil # (HT): 0.1 10 3/uL — NL (ref 0.0–0.6)
Eosinophil % (HT): 1 % — NL (ref 0–5)
Hematocrit (HT): 30 % — ABNORMAL LOW (ref 35–47)
Hemoglobin (HGB) (HT): 8.5 g/dL — ABNORMAL LOW (ref 12.0–16.0)
Lymphocyte # (HT): 1.7 10 3/uL — NL (ref 1.0–4.8)
Lymphocyte % (HT): 31 % — NL (ref 15–45)
MCHC (HT): 28.2 g/dL — ABNORMAL LOW (ref 31.0–37.5)
MCV (HT): 81 fL — NL (ref 80–100)
Mean Corpuscular Hemoglobin (MCH) (HT): 22.7 pg — ABNORMAL LOW (ref 26.0–34.0)
Monocyte # (HT): 0.3 10 3/uL — NL (ref 0.1–1.0)
Monocyte % (HT): 6 % — NL (ref 0–15)
Neutrophil # (HT): 3.5 10 3/uL — NL (ref 1.8–8.0)
Platelets (HT): 410 10 3/uL — NL (ref 150–450)
RBC (HT): 3.74 10 6/uL — ABNORMAL LOW (ref 3.80–5.20)
RDW (HT): 16.8 % — ABNORMAL HIGH (ref 0.0–15.2)
Seg Neut % (HT): 61 % — NL (ref 45–75)
WBC (HT): 5.6 10 3/uL — NL (ref 4.0–11.0)

## 2019-03-01 ENCOUNTER — Encounter: Payer: Self-pay | Admitting: Emergency Medicine

## 2019-03-01 LAB — UNMAPPED LAB RESULTS
Basophil # (HT): 0 10 3/uL — NL (ref 0.0–0.1)
Basophil % (HT): 0 % — NL (ref 0–3)
Eosinophil # (HT): 0.1 10 3/uL — NL (ref 0.0–0.8)
Eosinophil % (HT): 1 % — NL (ref 0–5)
HIV 1&2 ANTIGEN/ANTIBODY (HT): NONREACTIVE — NL
Hematocrit (HT): 30 % — ABNORMAL LOW (ref 38–48)
Hemoglobin (HGB) (HT): 8.5 g/dL — ABNORMAL LOW (ref 11.9–15.9)
Lymphocyte # (HT): 1.6 10 3/uL — NL (ref 0.6–3.3)
Lymphocyte % (HT): 22 % — NL (ref 15–45)
MCHC (HT): 28.7 g/dL — ABNORMAL LOW (ref 31.1–34.5)
MCV (HT): 79 fL — ABNORMAL LOW (ref 80–97)
Mean Corpuscular Hemoglobin (MCH) (HT): 22.6 pg — ABNORMAL LOW (ref 26.6–30.6)
Mean Platelet Volume (HT): 9.8 fL — NL (ref 9.0–12.2)
Monocyte # (HT): 0.4 10 3/uL — NL (ref 0.1–1.1)
Monocyte % (HT): 5 % — NL (ref 0–15)
Neutrophil # (HT): 5 10 3/uL — NL (ref 2.0–7.1)
Platelets (HT): 482 10 3/uL — ABNORMAL HIGH (ref 142–414)
RBC (HT): 3.76 10 6/uL — ABNORMAL LOW (ref 4.04–5.48)
RDW (HT): 17.1 % — ABNORMAL HIGH (ref 12.9–16.3)
Seg Neut % (HT): 71 % — NL (ref 45–75)
WBC (HT): 7.1 10 3/uL — NL (ref 4.8–10.4)

## 2019-03-04 ENCOUNTER — Telehealth: Payer: Self-pay

## 2019-03-04 NOTE — Telephone Encounter (Signed)
Did not reach patient to confirm telemed appointment for 03/06/19. Left message on voice mail to please call the office. Patient do not have Mychart.

## 2019-03-06 ENCOUNTER — Ambulatory Visit: Payer: Medicaid Other | Admitting: Obstetrics and Gynecology

## 2019-03-06 DIAGNOSIS — R102 Pelvic and perineal pain: Secondary | ICD-10-CM

## 2019-03-06 DIAGNOSIS — G8929 Other chronic pain: Secondary | ICD-10-CM

## 2019-03-06 NOTE — Progress Notes (Signed)
Telephone Visit     This is an established patient visit.    Reason for visit: Follow-up (Pelvic pain)    HPI:  Annette Preston is an 33 y.o. who previously had undergone a laparoscopic lysis of adhesions and left salpingectomy after failing conservative management of a TOA.  Patient has had a challenging postoperative course, with difficult pain control, exacerbated by pre-existing GI symptoms for which she was undergoing evaluation.  Intraoperatively, patient was found to have extensive intra-abdominal adhesions from her prior surgeries.  Patient has had multiple clinic and ED visits, with benign evaluation.  Patient has also had a trigger point injection, with minimal relief of symptoms.    Today patient reports that her pain is worse than when she was seen in the ED last 02/27/2019, describing pain as "10/10".  Patient describes pain as along the lower part of her belly.  Of note, patient is able to have a full conversation, with completion of full sentences during conversation.  Patient reports that she is still experiencing nausea and vomiting (for which she was undergoing GI evaluation), and is still taking her Protonix.  Patient reports that she had a TeleMed (video) visit with her PCP yesterday, and they made no changes to her current pain regimen, she is only taking Tylenol at present.      On ROS, patient reports N/V as above. Also reports fevers and chills, and states her temperature was 101 Fahrenheit this morning.  Reports that this temperature is new.  Denies cough.  Reports some shortness of breath, but thinks this is secondary to her blood pressure.  Denies constipation, denies dysuria.    Patient's problem list, allergies, and medications were reviewed and updated as appropriate. Please see the EHR for full details.    Assessment Plan:  We discussed reassuring components, including patient's ability to have a full conversation to review her symptoms.  We discussed reassuring nature that her PCP was  able to have a video visit with her yesterday, and deemed her well enough to not change any pain medication regimen.  We discussed need to follow-up with GI with regards to her nausea and vomiting, most likely after the [place] pandemic situation.  We also discussed similar need for follow-up with chronic pelvic pain clinic after the pandemic.  However, in the setting of patient's reported new fevers, did recommend she reach out to her PCP again to determine if she would need any testing or monitoring for coronavirus.    The plan was discussed with the patient and the patient/patient rep demonstrated understanding to the provider's satisfaction.    Consent was previously obtained from the patient to complete this telephone consult; including the potential for financial liability.    11-20 minutes was spent reviewing the EMR and management of this patient.     Chipper Oman, MD 03/06/2019 11:00 AM EDT

## 2019-03-10 LAB — UNMAPPED LAB RESULTS
Basophil # (HT): 0.1 10 3/uL — NL (ref 0.0–0.2)
Basophil % (HT): 1 % — NL (ref 0–3)
Eosinophil # (HT): 0.1 10 3/uL — NL (ref 0.0–0.6)
Eosinophil % (HT): 1 % — NL (ref 0–5)
Hematocrit (HT): 32 % — ABNORMAL LOW (ref 35–47)
Hemoglobin (HGB) (HT): 9.3 g/dL — ABNORMAL LOW (ref 12.0–16.0)
Lymphocyte # (HT): 1.5 10 3/uL — NL (ref 1.0–4.8)
Lymphocyte % (HT): 25 % — NL (ref 15–45)
MCHC (HT): 28.8 g/dL — ABNORMAL LOW (ref 31.0–37.5)
MCV (HT): 77 fL — ABNORMAL LOW (ref 80–100)
Mean Corpuscular Hemoglobin (MCH) (HT): 22.2 pg — ABNORMAL LOW (ref 26.0–34.0)
Monocyte # (HT): 0.3 10 3/uL — NL (ref 0.1–1.0)
Monocyte % (HT): 4 % — NL (ref 0–15)
Neutrophil # (HT): 4.2 10 3/uL — NL (ref 1.8–8.0)
Platelets (HT): 464 10 3/uL — ABNORMAL HIGH (ref 150–450)
RBC (HT): 4.18 10 6/uL — NL (ref 3.80–5.20)
RDW (HT): 16.6 % — ABNORMAL HIGH (ref 0.0–15.2)
Seg Neut % (HT): 69 % — NL (ref 45–75)
WBC (HT): 6.1 10 3/uL — NL (ref 4.0–11.0)

## 2019-03-17 LAB — UNMAPPED LAB RESULTS
ABO RH Blood Type (HT): A POS — NL
Antibody Screen (HT): NEGATIVE — NL
Basophil # (HT): 0 10 3/uL — NL (ref 0.0–0.2)
Basophil % (HT): 1 % — NL (ref 0–3)
Eosinophil # (HT): 0.1 10 3/uL — NL (ref 0.0–0.6)
Eosinophil % (HT): 1 % — NL (ref 0–5)
Hematocrit (HT): 31 % — ABNORMAL LOW (ref 35–47)
Hemoglobin (HGB) (HT): 9 g/dL — ABNORMAL LOW (ref 12.0–16.0)
Lymphocyte # (HT): 1.6 10 3/uL — NL (ref 1.0–4.8)
Lymphocyte % (HT): 31 % — NL (ref 15–45)
MCHC (HT): 29.5 g/dL — ABNORMAL LOW (ref 31.0–37.5)
MCV (HT): 77 fL — ABNORMAL LOW (ref 80–100)
Mean Corpuscular Hemoglobin (MCH) (HT): 22.7 pg — ABNORMAL LOW (ref 26.0–34.0)
Monocyte # (HT): 0.3 10 3/uL — NL (ref 0.1–1.0)
Monocyte % (HT): 5 % — NL (ref 0–15)
Neutrophil # (HT): 3.2 10 3/uL — NL (ref 1.8–8.0)
Platelets (HT): 582 10 3/uL — ABNORMAL HIGH (ref 150–450)
RBC (HT): 3.97 10 6/uL — NL (ref 3.80–5.20)
RDW (HT): 16.5 % — ABNORMAL HIGH (ref 0.0–15.2)
Seg Neut % (HT): 62 % — NL (ref 45–75)
WBC (HT): 5.2 10 3/uL — NL (ref 4.0–11.0)

## 2019-03-28 LAB — UNMAPPED LAB RESULTS
Basophil # (HT): 0 10 3/uL — NL (ref 0.0–0.2)
Basophil % (HT): 0 % — NL (ref 0–3)
Eosinophil # (HT): 0 10 3/uL — NL (ref 0.0–0.6)
Eosinophil % (HT): 0 % — NL (ref 0–5)
Hematocrit (HT): 29 % — ABNORMAL LOW (ref 35–47)
Hemoglobin (HGB) (HT): 8.4 g/dL — ABNORMAL LOW (ref 12.0–16.0)
Lymphocyte # (HT): 0.5 10 3/uL — ABNORMAL LOW (ref 1.0–4.8)
Lymphocyte % (HT): 3 % — ABNORMAL LOW (ref 15–45)
MCHC (HT): 28.7 g/dL — ABNORMAL LOW (ref 31.0–37.5)
MCV (HT): 77 fL — ABNORMAL LOW (ref 80–100)
Mean Corpuscular Hemoglobin (MCH) (HT): 22.2 pg — ABNORMAL LOW (ref 26.0–34.0)
Monocyte # (HT): 0.8 10 3/uL — NL (ref 0.1–1.0)
Monocyte % (HT): 5 % — NL (ref 0–15)
Neutrophil # (HT): 14.5 10 3/uL — ABNORMAL HIGH (ref 1.8–8.0)
Platelets (HT): 339 10 3/uL — NL (ref 150–450)
RBC (HT): 3.79 10 6/uL — ABNORMAL LOW (ref 3.80–5.20)
RDW (HT): 17.2 % — ABNORMAL HIGH (ref 0.0–15.2)
Seg Neut % (HT): 91 % — ABNORMAL HIGH (ref 45–75)
WBC (HT): 15.8 10 3/uL — ABNORMAL HIGH (ref 4.0–11.0)

## 2019-03-29 LAB — UNMAPPED LAB RESULTS
Hematocrit (HT): 24 % — ABNORMAL LOW (ref 35–47)
Hemoglobin (HGB) (HT): 6.8 g/dL — ABNORMAL LOW (ref 12.0–16.0)
MCHC (HT): 28.1 g/dL — ABNORMAL LOW (ref 31.0–37.5)
MCV (HT): 79 fL — ABNORMAL LOW (ref 80–100)
Mean Corpuscular Hemoglobin (MCH) (HT): 22.3 pg — ABNORMAL LOW (ref 26.0–34.0)
Platelets (HT): 261 10 3/uL — NL (ref 150–450)
RBC (HT): 3.05 10 6/uL — ABNORMAL LOW (ref 3.80–5.20)
RDW (HT): 17.4 % — ABNORMAL HIGH (ref 0.0–15.2)
WBC (HT): 13 10 3/uL — ABNORMAL HIGH (ref 4.0–11.0)

## 2019-03-31 LAB — UNMAPPED LAB RESULTS
ABO RH Blood Type (HT): A POS — NL
Antibody Screen (HT): NEGATIVE — NL
Hematocrit (HT): 22 % — ABNORMAL LOW (ref 35–47)
Hemoglobin (HGB) (HT): 6.2 g/dL — ABNORMAL LOW (ref 12.0–16.0)
MCHC (HT): 28.7 g/dL — ABNORMAL LOW (ref 31.0–37.5)
MCV (HT): 77 fL — ABNORMAL LOW (ref 80–100)
Mean Corpuscular Hemoglobin (MCH) (HT): 22.2 pg — ABNORMAL LOW (ref 26.0–34.0)
Platelets (HT): 270 10 3/uL — NL (ref 150–450)
RBC (HT): 2.79 10 6/uL — ABNORMAL LOW (ref 3.80–5.20)
RDW (HT): 17.9 % — ABNORMAL HIGH (ref 0.0–15.2)
WBC (HT): 8.5 10 3/uL — NL (ref 4.0–11.0)

## 2019-04-01 LAB — UNMAPPED LAB RESULTS
Hematocrit (HT): 29 % — ABNORMAL LOW (ref 35–47)
Hemoglobin (HGB) (HT): 8.5 g/dL — ABNORMAL LOW (ref 12.0–16.0)
MCHC (HT): 29.7 g/dL — ABNORMAL LOW (ref 31.0–37.5)
MCV (HT): 75 fL — ABNORMAL LOW (ref 80–100)
Mean Corpuscular Hemoglobin (MCH) (HT): 22.3 pg — ABNORMAL LOW (ref 26.0–34.0)
Platelets (HT): 323 10 3/uL — NL (ref 150–450)
RBC (HT): 3.82 10 6/uL — NL (ref 3.80–5.20)
RDW (HT): 18.4 % — ABNORMAL HIGH (ref 0.0–15.2)
WBC (HT): 7.9 10 3/uL — NL (ref 4.0–11.0)

## 2019-04-02 LAB — UNMAPPED LAB RESULTS
Hematocrit (HT): 27 % — ABNORMAL LOW (ref 35–47)
Hemoglobin (HGB) (HT): 8 g/dL — ABNORMAL LOW (ref 12.0–16.0)
MCHC (HT): 30.2 g/dL — ABNORMAL LOW (ref 31.0–37.5)
MCV (HT): 74 fL — ABNORMAL LOW (ref 80–100)
Mean Corpuscular Hemoglobin (MCH) (HT): 22.3 pg — ABNORMAL LOW (ref 26.0–34.0)
Platelets (HT): 366 10 3/uL — NL (ref 150–450)
RBC (HT): 3.59 10 6/uL — ABNORMAL LOW (ref 3.80–5.20)
RDW (HT): 17.9 % — ABNORMAL HIGH (ref 0.0–15.2)
WBC (HT): 8.1 10 3/uL — NL (ref 4.0–11.0)

## 2019-04-03 LAB — UNMAPPED LAB RESULTS

## 2019-04-07 LAB — UNMAPPED LAB RESULTS
Hematocrit (HT): 30.7 % — ABNORMAL LOW (ref 34.0–47.0)
Hemoglobin (HGB) (HT): 9.6 g/dL — ABNORMAL LOW (ref 11.5–16.0)
MCHC (HT): 31.3 g/dL — ABNORMAL LOW (ref 32.0–36.0)
MCV (HT): 73.3 FL — ABNORMAL LOW (ref 81.0–99.0)
Mean Corpuscular Hemoglobin (MCH) (HT): 22.9 pg — ABNORMAL LOW (ref 26.0–34.0)
Platelets (HT): 906 10 3/uL — ABNORMAL HIGH (ref 140–400)
RBC (HT): 4.19 10 6/uL — NL (ref 3.80–5.20)
RDW (HT): 17.8 % — ABNORMAL HIGH (ref 11.5–15.0)
WBC (HT): 10.8 10 3/uL — NL (ref 4.0–10.8)

## 2019-07-21 ENCOUNTER — Emergency Department
Admission: EM | Admit: 2019-07-21 | Discharge: 2019-07-21 | Disposition: A | Discharge status: Home / Self Care | Payer: Medicaid Other | Source: Ambulatory Visit | Attending: Emergency Medicine | Admitting: Emergency Medicine

## 2019-07-21 ENCOUNTER — Emergency Department: Payer: Medicaid Other

## 2019-07-21 DIAGNOSIS — R109 Unspecified abdominal pain: Secondary | ICD-10-CM

## 2019-07-21 DIAGNOSIS — N200 Calculus of kidney: Secondary | ICD-10-CM

## 2019-07-21 DIAGNOSIS — R319 Hematuria, unspecified: Secondary | ICD-10-CM

## 2019-07-21 DIAGNOSIS — R110 Nausea: Secondary | ICD-10-CM

## 2019-07-21 DIAGNOSIS — R300 Dysuria: Secondary | ICD-10-CM

## 2019-07-21 DIAGNOSIS — N83201 Unspecified ovarian cyst, right side: Secondary | ICD-10-CM | POA: Insufficient documentation

## 2019-07-21 LAB — HOLD SST

## 2019-07-21 LAB — URINALYSIS REFLEX TO CULTURE
Blood,UA: NEGATIVE
Glucose,UA: NEGATIVE mg/dL
Ketones, UA: NEGATIVE
Nitrite,UA: NEGATIVE
Protein,UA: NEGATIVE mg/dL
Specific Gravity,UA: 1.013 (ref 1.001–1.030)
pH,UA: 6.5 (ref 5.0–8.0)

## 2019-07-21 LAB — RUQ PANEL (ED ONLY)
ALT: 8 U/L (ref 0–35)
AST: 13 U/L (ref 0–35)
Albumin: 4.2 g/dL (ref 3.5–5.2)
Alk Phos: 64 U/L (ref 35–105)
Amylase: 34 U/L (ref 28–100)
Bilirubin,Direct: <0.2 mg/dL (ref 0.0–0.3)
Bilirubin,Total: 0.2 mg/dL (ref 0.0–1.2)
Lipase: 13 U/L (ref 13–60)
Total Protein: 7.1 g/dL (ref 6.3–7.7)

## 2019-07-21 LAB — BASIC METABOLIC PANEL
Anion Gap: 11 (ref 7–16)
CO2: 25 mmol/L (ref 20–28)
Calcium: 9.3 mg/dL (ref 8.8–10.2)
Chloride: 104 mmol/L (ref 96–108)
Creatinine: 0.72 mg/dL (ref 0.51–0.95)
GFR,Black: 127 *
GFR,Caucasian: 110 *
Glucose: 110 mg/dL — ABNORMAL HIGH (ref 60–99)
Lab: 11 mg/dL (ref 6–20)
Potassium: 4.2 mmol/L (ref 3.3–5.1)
Sodium: 140 mmol/L (ref 133–145)

## 2019-07-21 LAB — CBC AND DIFFERENTIAL
Baso # K/uL: 0 10*3/uL (ref 0.0–0.1)
Basophil %: 0.2 %
Eos # K/uL: 0.1 10*3/uL (ref 0.0–0.4)
Eosinophil %: 0.7 %
Hematocrit: 33 % — ABNORMAL LOW (ref 34–45)
Hemoglobin: 9.7 g/dL — ABNORMAL LOW (ref 11.2–15.7)
IMM Granulocytes #: 0 10*3/uL (ref 0.0–0.0)
IMM Granulocytes: 0.4 %
Lymph # K/uL: 1.5 10*3/uL (ref 1.2–3.7)
Lymphocyte %: 18.4 %
MCH: 25 pg — ABNORMAL LOW (ref 26–32)
MCHC: 29 g/dL — ABNORMAL LOW (ref 32–36)
MCV: 85 fL (ref 79–95)
Mono # K/uL: 0.4 10*3/uL (ref 0.2–0.9)
Monocyte %: 4.5 %
Neut # K/uL: 6.3 10*3/uL — ABNORMAL HIGH (ref 1.6–6.1)
Nucl RBC # K/uL: 0 10*3/uL (ref 0.0–0.0)
Nucl RBC %: 0 /100 WBC (ref 0.0–0.2)
Platelets: 478 10*3/uL — ABNORMAL HIGH (ref 160–370)
RBC: 3.9 MIL/uL (ref 3.9–5.2)
RDW: 16.2 % — ABNORMAL HIGH (ref 11.7–14.4)
Seg Neut %: 75.8 %
WBC: 8.4 10*3/uL (ref 4.0–10.0)

## 2019-07-21 LAB — POCT URINE PREGNANCY
Lot #: 41518302
Preg Test,UR POC: NEGATIVE

## 2019-07-21 LAB — HOLD LAVENDER

## 2019-07-21 LAB — URINE MICROSCOPIC (IQ200)

## 2019-07-21 LAB — POCT URINALYSIS DIPSTICK
Exp date: 2282021
Glucose,UA POCT: NORMAL mg/dL
Ketones,UA POCT: NEGATIVE mg/dL
Leuk Esterase,UA POCT: 2 — AB
Lot #: 41518302
Nitrite,UA POCT: NEGATIVE
PH,UA POCT: 5 (ref 5–8)

## 2019-07-21 MED ORDER — SODIUM CHLORIDE 0.9 % IV BOLUS *I*
1000.0000 mL | Freq: Once | Status: AC
Start: 2019-07-21 — End: 2019-07-21
  Administered 2019-07-21: 1000 mL via INTRAVENOUS

## 2019-07-21 MED ORDER — SODIUM CHLORIDE 0.9 % FLUSH FOR PUMPS *I*
0.0000 mL/h | INTRAVENOUS | Status: DC | PRN
Start: 2019-07-21 — End: 2019-07-21

## 2019-07-21 MED ORDER — DEXTROSE 5 % FLUSH FOR PUMPS *I*
0.0000 mL/h | INTRAVENOUS | Status: DC | PRN
Start: 2019-07-21 — End: 2019-07-21

## 2019-07-21 MED ORDER — KETOROLAC TROMETHAMINE 30 MG/ML IJ SOLN *I*
15.0000 mg | Freq: Once | INTRAMUSCULAR | Status: AC
Start: 2019-07-21 — End: 2019-07-21
  Administered 2019-07-21: 15 mg via INTRAVENOUS
  Filled 2019-07-21: qty 1

## 2019-07-21 MED ORDER — ONDANSETRON HCL 2 MG/ML IV SOLN *I*
4.0000 mg | Freq: Once | INTRAMUSCULAR | Status: AC
Start: 2019-07-21 — End: 2019-07-21
  Administered 2019-07-21: 4 mg via INTRAVENOUS
  Filled 2019-07-21: qty 2

## 2019-07-21 NOTE — Discharge Instructions (Signed)
Rest, stay hydrated  Use warm compresses   Take tylenol and Motrin for pain   Follow-up with your PCP and return with worsening symptoms or concerns

## 2019-07-21 NOTE — ED Triage Notes (Signed)
C/o R flank pain, dysuria, chills, diarrhea, emesis x 2 days.        Triage Note   Nydia Bouton, RN

## 2019-07-21 NOTE — ED Provider Notes (Signed)
History     Chief Complaint   Patient presents with    Flank Pain    Chills     Patient is a 33 year old female who presents to the emergency department with complaints of right flank pain.  Patient states her symptoms started 2 to 3 days ago.  Patient states her symptoms of been constant and worsening.  Patient reports dysuria and nausea.  Denies any vomiting.  Denies any vaginal discharge.  Patient denies any fevers but states she has had chills.      History provided by:  Patient  Language interpreter used: No        Medical/Surgical/Family History     Past Medical History:   Diagnosis Date    Anemia     Depression     GI bleed     hemetesis        Patient Active Problem List   Diagnosis Code    Anemia D64.9    Abdominal pain, unspecified abdominal location R10.9    TOA (tubo-ovarian abscess) N70.93    s/p laparoscopic LS with extensive LOA Z98.890    Abdominal pain R10.9    Pelvic inflammatory disease N73.9    Pelvic pain R10.2            Past Surgical History:   Procedure Laterality Date    CESAREAN SECTION, UNSPECIFIED      CHOLECYSTECTOMY      GALLBLADDER SURGERY      PR ESOPHAGOGASTRODUODENOSCOPY TRANSORAL DIAGNOSTIC N/A 01/29/2019    Procedure: EGD (ESOPHAGOGASTRODUODENOSCOPY);  Surgeon: Maryann Alar, MD;  Location: [facility];  Service: GI    PR LAP,DIAGNOSTIC ABDOMEN N/A 02/03/2019    Procedure: LAPAROSCOPY, DIAGNOSTIC, EXTENSIVE LYSIS OF ADHESIONS;  Surgeon: Thompsoncarrillo, Raquel Sarna, MD;  Location: [facility];  Service: OBGYN     Family History   Problem Relation Age of Onset    Colon cancer Paternal Grandmother     Breast cancer Maternal Grandmother 54          Social History     Tobacco Use    Smoking status: Never Smoker    Smokeless tobacco: Never Used   Substance Use Topics    Alcohol use: Not Currently     Frequency: Never    Drug use: Never     Living Situation     Questions Responses    Patient lives with Family    Homeless     Caregiver for other family member      External Services     Employment Employed    Domestic Violence Risk                 Review of Systems   Review of Systems   Constitutional: Negative for activity change, appetite change, chills, diaphoresis, fatigue, fever and unexpected weight change.   HENT: Negative.    Eyes: Negative for photophobia and visual disturbance.   Respiratory: Negative for apnea, cough, choking, chest tightness, shortness of breath, wheezing and stridor.    Cardiovascular: Negative for chest pain, palpitations and leg swelling.   Gastrointestinal: Positive for abdominal pain and nausea. Negative for abdominal distention, constipation, diarrhea and vomiting.   Endocrine: Negative.    Genitourinary: Positive for dysuria and flank pain. Negative for frequency, hematuria, urgency and vaginal discharge.   Musculoskeletal: Negative for arthralgias, back pain, gait problem, joint swelling, myalgias, neck pain and neck stiffness.   Skin: Negative for color change, pallor, rash and wound.   Allergic/Immunologic: Negative.  Neurological: Negative for dizziness, weakness, light-headedness and headaches.   Hematological: Negative.    Psychiatric/Behavioral: Negative for self-injury and suicidal ideas.   All other systems reviewed and are negative.      Physical Exam     Triage Vitals  Triage Start: Start, (07/21/19 0930)   First Recorded BP: 154/87, Resp: 18, Temp: 36.1 C (97 F), Temp src: TEMPORAL Oxygen Therapy SpO2: 99 %, O2 Device: None (Room air), Heart Rate: 89, (07/21/19 0931)  .  First Pain Reported  0-10 Scale: 9, (07/21/19 1478)       Physical Exam  Vitals signs and nursing note reviewed.   Constitutional:       General: She is not in acute distress.     Appearance: She is well-developed. She is not diaphoretic.   HENT:      Head: Normocephalic.   Eyes:      Conjunctiva/sclera: Conjunctivae normal.      Pupils: Pupils are equal, round, and reactive to light.   Neck:      Musculoskeletal: Normal range of motion and neck supple.    Cardiovascular:      Rate and Rhythm: Normal rate and regular rhythm.      Heart sounds: Normal heart sounds.   Pulmonary:      Effort: Pulmonary effort is normal. No respiratory distress.      Breath sounds: Normal breath sounds. No wheezing or rales.   Chest:      Chest wall: No tenderness.   Abdominal:      General: Bowel sounds are normal. There is no distension.      Palpations: Abdomen is soft.      Tenderness: There is abdominal tenderness.      Comments: Abdomen soft, nondistended, left flank tenderness   Musculoskeletal: Normal range of motion.         General: No tenderness or deformity.   Skin:     General: Skin is warm and dry.      Coloration: Skin is not pale.      Findings: No erythema or rash.   Neurological:      Mental Status: She is alert and oriented to person, place, and time.   Psychiatric:         Behavior: Behavior normal.         Thought Content: Thought content normal.         Judgment: Judgment normal.         Medical Decision Making   Patient seen by me on:  07/21/2019    Assessment:  Patient is a 33 year old female who presents to the emergency department with complaints of right flank pain    Differential diagnosis:  UTI, pyelonephritis, kidney stone, ovarian cyst    Plan:  Orders Placed This Encounter      Urinalysis reflex to culture      Aerobic culture      CT abdomen and pelvis without contrast      Hold SST      Hold lavender      Basic metabolic panel      CBC and differential      RUQ panel (ED only)      Basic metabolic panel      CBC and differential      RUQ panel (ED only)      Urinalysis with reflex to Microscopic UA and reflex to Bacterial Culture      Urine microscopic (iq200)      Initiate droplet &  contact isolation with eye protection      POCT urinalysis dipstick      POCT urine pregnancy      Insert peripheral IV    Medications  sodium chloride 0.9 % FLUSH REQUIRED IF PATIENT HAS IV (has no administration in time range)  dextrose 5 % FLUSH REQUIRED IF PATIENT HAS IV  (has no administration in time range)  sodium chloride 0.9 % bolus 1,000 mL (0 mLs Intravenous Stopped 07/21/19 1518)  ketorolac (TORADOL) 30 mg/mL injection 15 mg (15 mg Intravenous Given 07/21/19 1147)  ondansetron (ZOFRAN) injection 4 mg (4 mg Intravenous Given 07/21/19 1147)      Independent review of: Existing labs, CT scans    ED Course and Disposition:  Labs showed:  Labs Reviewed  URINALYSIS REFLEX TO CULTURE - Abnormal; Notable for the following components:     Leuk Esterase,UA              3+ (*)              All other components within normal limits  BASIC METABOLIC PANEL - Abnormal; Notable for the following components:     Glucose                       110 (*)             All other components within normal limits  CBC AND DIFFERENTIAL - Abnormal; Notable for the following components:     Hemoglobin                    9.7 (*)                Hematocrit                    33 (*)                 MCH                           25 (*)                 MCHC                          29 (*)                 RDW                           16.2 (*)               Platelets                     478 (*)                Neut # K/uL                   6.3 (*)             All other components within normal limits  URINE MICROSCOPIC (IQ200) - Abnormal; Notable for the following components:     WBC,UA                        21-50 (*)               Squam Epithel,UA  4+ (*)              All other components within normal limits  POCT URINALYSIS DIPSTICK - Abnormal; Notable for the following components:     Leuk Esterase,UA POCT         +2 (*)                 Protein,UA POCT               Trace (*)               Bilirubin,Ur                    (*)                  Blood,UA POCT                 Trace (*)            All other components within normal limits  AEROBIC CULTURE  HOLD SST  HOLD LAVENDER  BASIC METABOLIC PANEL  CBC AND DIFFERENTIAL  RUQ PANEL (ED ONLY)  RUQ PANEL (ED ONLY)  UA WITH REFLEX TO CULTURE  POCT URINE  PREGNANCY    Ct showed:  CT abdomen and pelvis without contrast   Final Result        1. Nonobstructing left renal calculus.        2. Noncontrast CT is suboptimal for evaluation for urinary tract infection.        END OF IMPRESSION        [facility] submits this DICOM format image data and final report to the [facility], an independent secure electronic health information exchange, on a reciprocally searchable basis (with patient authorization) for a minimum of 12 months after exam     date.     Patient educated regarding results.  Based on patient's CT scan it appears that she has a small right-sided ovarian cyst.  Patient's urine was sent for culture.  Plan to discharge patient home with instructions to use warm compresses, take Tylenol and Motrin for discomfort.  Patient was agreeable with this plan and understands return precautions.            Denna Haggard, NP          [place], [place], [place]  07/21/19 [phone]

## 2019-07-22 LAB — AEROBIC CULTURE: Aerobic Culture: 0

## 2019-07-23 LAB — UNMAPPED LAB RESULTS
Basophil # (HT): 0 10 3/uL — NL (ref 0.0–0.2)
Basophil % (HT): 0 % — NL (ref 0–3)
Eosinophil # (HT): 0 10 3/uL — NL (ref 0.0–0.6)
Eosinophil % (HT): 1 % — NL (ref 0–5)
Hematocrit (HT): 32 % — ABNORMAL LOW (ref 35–47)
Hemoglobin (HGB) (HT): 9.5 g/dL — ABNORMAL LOW (ref 12.0–16.0)
Lymphocyte # (HT): 1.4 10 3/uL — NL (ref 1.0–4.8)
Lymphocyte % (HT): 18 % — NL (ref 15–45)
MCHC (HT): 29.9 g/dL — ABNORMAL LOW (ref 31.0–37.5)
MCV (HT): 83 fL — NL (ref 80–100)
Mean Corpuscular Hemoglobin (MCH) (HT): 24.7 pg — ABNORMAL LOW (ref 26.0–34.0)
Monocyte # (HT): 0.4 10 3/uL — NL (ref 0.1–1.0)
Monocyte % (HT): 5 % — NL (ref 0–15)
Neutrophil # (HT): 6 10 3/uL — NL (ref 1.8–8.0)
Platelets (HT): 489 10 3/uL — ABNORMAL HIGH (ref 150–450)
RBC (HT): 3.85 10 6/uL — NL (ref 3.80–5.20)
RDW (HT): 16.4 % — ABNORMAL HIGH (ref 0.0–15.2)
Seg Neut % (HT): 76 % — ABNORMAL HIGH (ref 45–75)
WBC (HT): 7.9 10 3/uL — NL (ref 4.0–11.0)

## 2019-07-27 LAB — UNMAPPED LAB RESULTS
Basophil # (HT): 0 10 3/uL — NL (ref 0.0–0.2)
Basophil % (HT): 0 % — NL (ref 0–3)
Eosinophil # (HT): 0 10 3/uL — NL (ref 0.0–0.6)
Eosinophil % (HT): 0 % — NL (ref 0–5)
Hematocrit (HT): 35 % — NL (ref 35–47)
Hemoglobin (HGB) (HT): 10.3 g/dL — ABNORMAL LOW (ref 12.0–16.0)
Lymphocyte # (HT): 1.5 10 3/uL — NL (ref 1.0–4.8)
Lymphocyte % (HT): 19 % — NL (ref 15–45)
MCHC (HT): 29.9 g/dL — ABNORMAL LOW (ref 31.0–37.5)
MCV (HT): 82 fL — NL (ref 80–100)
Mean Corpuscular Hemoglobin (MCH) (HT): 24.6 pg — ABNORMAL LOW (ref 26.0–34.0)
Monocyte # (HT): 0.3 10 3/uL — NL (ref 0.1–1.0)
Monocyte % (HT): 3 % — NL (ref 0–15)
Neutrophil # (HT): 5.8 10 3/uL — NL (ref 1.8–8.0)
Platelets (HT): 523 10 3/uL — ABNORMAL HIGH (ref 150–450)
RBC (HT): 4.19 10 6/uL — NL (ref 3.80–5.20)
RDW (HT): 15.9 % — ABNORMAL HIGH (ref 0.0–15.2)
Seg Neut % (HT): 76 % — ABNORMAL HIGH (ref 45–75)
WBC (HT): 7.7 10 3/uL — NL (ref 4.0–11.0)

## 2019-07-28 LAB — UNMAPPED LAB RESULTS
Basophil # (HT): 0 10 3/uL — NL (ref 0.0–0.2)
Basophil % (HT): 0 % — NL (ref 0–3)
Eosinophil # (HT): 0.1 10 3/uL — NL (ref 0.0–0.6)
Eosinophil % (HT): 1 % — NL (ref 0–5)
Hematocrit (HT): 29 % — ABNORMAL LOW (ref 35–47)
Hematocrit (HT): 32 % — ABNORMAL LOW (ref 35–47)
Hematocrit (HT): 30 % — ABNORMAL LOW (ref 35–47)
Hemoglobin (HGB) (HT): 8.4 g/dL — ABNORMAL LOW (ref 12.0–16.0)
Hemoglobin (HGB) (HT): 9.7 g/dL — ABNORMAL LOW (ref 12.0–16.0)
Hemoglobin (HGB) (HT): 9 g/dL — ABNORMAL LOW (ref 12.0–16.0)
Lymphocyte # (HT): 2.2 10 3/uL — NL (ref 1.0–4.8)
Lymphocyte % (HT): 30 % — NL (ref 15–45)
MCHC (HT): 29.5 g/dL — ABNORMAL LOW (ref 31.0–37.5)
MCV (HT): 82 fL — NL (ref 80–100)
Mean Corpuscular Hemoglobin (MCH) (HT): 24.2 pg — ABNORMAL LOW (ref 26.0–34.0)
Monocyte # (HT): 0.5 10 3/uL — NL (ref 0.1–1.0)
Monocyte % (HT): 7 % — NL (ref 0–15)
Neutrophil # (HT): 4.6 10 3/uL — NL (ref 1.8–8.0)
Platelets (HT): 456 10 3/uL — ABNORMAL HIGH (ref 150–450)
RBC (HT): 3.47 10 6/uL — ABNORMAL LOW (ref 3.80–5.20)
RDW (HT): 16.2 % — ABNORMAL HIGH (ref 0.0–15.2)
Seg Neut % (HT): 62 % — NL (ref 45–75)
WBC (HT): 7.4 10 3/uL — NL (ref 4.0–11.0)

## 2019-07-29 LAB — UNMAPPED LAB RESULTS
Basophil # (HT): 0 10 3/uL — NL (ref 0.0–0.2)
Basophil % (HT): 1 % — NL (ref 0–3)
Eosinophil # (HT): 0.1 10 3/uL — NL (ref 0.0–0.6)
Eosinophil % (HT): 1 % — NL (ref 0–5)
Hematocrit (HT): 31 % — ABNORMAL LOW (ref 35–47)
Hemoglobin (HGB) (HT): 9.3 g/dL — ABNORMAL LOW (ref 12.0–16.0)
Lymphocyte # (HT): 2.1 10 3/uL — NL (ref 1.0–4.8)
Lymphocyte % (HT): 27 % — NL (ref 15–45)
MCHC (HT): 30.1 g/dL — ABNORMAL LOW (ref 31.0–37.5)
MCV (HT): 82 fL — NL (ref 80–100)
Mean Corpuscular Hemoglobin (MCH) (HT): 24.6 pg — ABNORMAL LOW (ref 26.0–34.0)
Monocyte # (HT): 0.4 10 3/uL — NL (ref 0.1–1.0)
Monocyte % (HT): 5 % — NL (ref 0–15)
Neutrophil # (HT): 5.1 10 3/uL — NL (ref 1.8–8.0)
Platelets (HT): 481 10 3/uL — ABNORMAL HIGH (ref 150–450)
RBC (HT): 3.78 10 6/uL — ABNORMAL LOW (ref 3.80–5.20)
RDW (HT): 16.2 % — ABNORMAL HIGH (ref 0.0–15.2)
Seg Neut % (HT): 66 % — NL (ref 45–75)
WBC (HT): 7.7 10 3/uL — NL (ref 4.0–11.0)

## 2019-08-01 ENCOUNTER — Emergency Department
Admission: EM | Admit: 2019-08-01 | Discharge: 2019-08-01 | Discharge status: Against Medical Advice | Payer: Medicaid Other | Source: Ambulatory Visit | Attending: Emergency Medicine | Admitting: Emergency Medicine

## 2019-08-01 DIAGNOSIS — R109 Unspecified abdominal pain: Secondary | ICD-10-CM | POA: Insufficient documentation

## 2019-08-01 DIAGNOSIS — R1110 Vomiting, unspecified: Secondary | ICD-10-CM | POA: Insufficient documentation

## 2019-08-01 DIAGNOSIS — Z5320 Procedure and treatment not carried out because of patient's decision for unspecified reasons: Secondary | ICD-10-CM | POA: Insufficient documentation

## 2019-08-01 DIAGNOSIS — K920 Hematemesis: Secondary | ICD-10-CM

## 2019-08-01 LAB — APTT: aPTT: 21.9 s — ABNORMAL LOW (ref 25.8–37.9)

## 2019-08-01 LAB — PLASMA PROF 7 (ED ONLY)
Anion Gap,PL: 17 — ABNORMAL HIGH (ref 7–16)
CO2,Plasma: 20 mmol/L (ref 20–28)
Chloride,Plasma: 102 mmol/L (ref 96–108)
Creatinine: 0.7 mg/dL (ref 0.51–0.95)
GFR,Black: 131 *
GFR,Caucasian: 114 *
Glucose,Plasma: 91 mg/dL (ref 60–99)
Potassium,Plasma: 4.1 mmol/L (ref 3.4–4.7)
Sodium,Plasma: 139 mmol/L (ref 133–145)
UN,Plasma: 10 mg/dL (ref 6–20)

## 2019-08-01 LAB — CBC AND DIFFERENTIAL
Baso # K/uL: 0 10*3/uL (ref 0.0–0.1)
Basophil %: 0.4 %
Eos # K/uL: 0 10*3/uL (ref 0.0–0.4)
Eosinophil %: 0.4 %
Hematocrit: 36 % (ref 34–45)
Hemoglobin: 10.4 g/dL — ABNORMAL LOW (ref 11.2–15.7)
IMM Granulocytes #: 0 10*3/uL (ref 0.0–0.0)
IMM Granulocytes: 0.3 %
Lymph # K/uL: 1.6 10*3/uL (ref 1.2–3.7)
Lymphocyte %: 21.5 %
MCH: 24 pg/cell — ABNORMAL LOW (ref 26–32)
MCHC: 29 g/dL — ABNORMAL LOW (ref 32–36)
MCV: 84 fL (ref 79–95)
Mono # K/uL: 0.4 10*3/uL (ref 0.2–0.9)
Monocyte %: 4.8 %
Neut # K/uL: 5.5 10*3/uL (ref 1.6–6.1)
Nucl RBC # K/uL: 0 10*3/uL (ref 0.0–0.0)
Nucl RBC %: 0 /100 WBC (ref 0.0–0.2)
Platelets: 551 10*3/uL — ABNORMAL HIGH (ref 160–370)
RBC: 4.3 MIL/uL (ref 3.9–5.2)
RDW: 15.9 % — ABNORMAL HIGH (ref 11.7–14.4)
Seg Neut %: 72.6 %
WBC: 7.5 10*3/uL (ref 4.0–10.0)

## 2019-08-01 LAB — RUQ PANEL (ED ONLY)
ALT: 21 U/L (ref 0–35)
Albumin: 4.4 g/dL (ref 3.5–5.2)
Alk Phos: 58 U/L (ref 35–105)
Amylase: 29 U/L (ref 28–100)
Bilirubin,Direct: <0.2 mg/dL (ref 0.0–0.3)
Bilirubin,Total: <0.2 mg/dL (ref 0.0–1.2)
Lipase: 19 U/L (ref 13–60)
Total Protein: 7.9 g/dL — ABNORMAL HIGH (ref 6.3–7.7)

## 2019-08-01 LAB — HOLD SST

## 2019-08-01 LAB — BLOOD BANK HOLD RED

## 2019-08-01 LAB — BLOOD BANK HOLD LAVENDER

## 2019-08-01 LAB — TYPE AND SCREEN
ABO RH Blood Type: A POS
Antibody Screen: NEGATIVE

## 2019-08-01 LAB — PROTIME-INR
INR: 1 (ref 0.9–1.1)
Protime: 11.4 s (ref 10.0–12.9)

## 2019-08-01 LAB — HOLD BLUE

## 2019-08-01 MED ORDER — SODIUM CHLORIDE 0.9 % FLUSH FOR PUMPS *I*
0.0000 mL/h | INTRAVENOUS | Status: DC | PRN
Start: 2019-08-01 — End: 2019-08-02

## 2019-08-01 MED ORDER — DEXTROSE 5 % FLUSH FOR PUMPS *I*
0.0000 mL/h | INTRAVENOUS | Status: DC | PRN
Start: 2019-08-01 — End: 2019-08-02

## 2019-08-01 NOTE — ED Triage Notes (Signed)
See call in note.  Pt continues with abdominal pain, hematemesis and diarrhea.  States 4 episodes of hematemesis today.  Seen recently at [facility] for same.         Triage Note   Rosana Berger, RN

## 2019-08-01 NOTE — ED Notes (Signed)
08/01/19 1610   Expected Call-In Information   ED Service [facility] Adult Call-in   PCP/Service Referral [facility]   Call received from [place]? No   Pt Info note/Reason for sending VOMITING BLOOD WHEN EVER SHE EATS CHILLS NO FEVER WAS AT [facility] FOR 2 DAYS BUT CONTINUES TO VOMIT BLOOD   Call reported to [place]

## 2019-08-01 NOTE — First Provider Contact (Signed)
ED First Provider Contact Note    Initial provider evaluation performed by   ED First Provider Contact     Date/Time Event User Comments    08/01/19 1623 ED First Provider Contact Lysle Pearl C Initial Face to Face Provider Contact          Vital signs reviewed.    Assessment: 33 y/o F with PMHx of anemia and GIB presents to the ED due to hematemesis, abdominal pain, and diarrhea x 1 week.  Was admitted to [facility] a few days ago for these sx and had unremarkable EGD per patient.  Denies melena, hematochezia, SOB, chest pain, F/C, blood thinners, alcohol use, illicit drug use.     Orders placed:  LABS and IV zofran     Patient requires further evaluation.     [place], [place], 08/01/2019, 4:23 PM     Duane L[place], [place]  08/01/19 [phone]

## 2019-08-01 NOTE — ED Notes (Signed)
Over head paged x1.

## 2019-08-01 NOTE — ED Notes (Signed)
No response.  Overhead paged x2.

## 2019-08-02 ENCOUNTER — Encounter: Payer: Self-pay | Admitting: Emergency Medicine

## 2019-08-02 ENCOUNTER — Emergency Department
Admission: EM | Admit: 2019-08-02 | Discharge: 2019-08-02 | Disposition: A | Discharge status: Home / Self Care | Payer: Medicaid Other | Source: Ambulatory Visit | Attending: Emergency Medicine | Admitting: Emergency Medicine

## 2019-08-02 ENCOUNTER — Other Ambulatory Visit: Payer: Self-pay | Admitting: Cardiology

## 2019-08-02 DIAGNOSIS — R197 Diarrhea, unspecified: Secondary | ICD-10-CM | POA: Insufficient documentation

## 2019-08-02 DIAGNOSIS — R112 Nausea with vomiting, unspecified: Secondary | ICD-10-CM | POA: Insufficient documentation

## 2019-08-02 DIAGNOSIS — I499 Cardiac arrhythmia, unspecified: Secondary | ICD-10-CM

## 2019-08-02 DIAGNOSIS — R1013 Epigastric pain: Secondary | ICD-10-CM | POA: Insufficient documentation

## 2019-08-02 LAB — CBC AND DIFFERENTIAL
Baso # K/uL: 0 10*3/uL (ref 0.0–0.1)
Basophil %: 0.5 %
Eos # K/uL: 0 10*3/uL (ref 0.0–0.4)
Eosinophil %: 0.4 %
Hematocrit: 32 % — ABNORMAL LOW (ref 34–45)
Hemoglobin: 9.6 g/dL — ABNORMAL LOW (ref 11.2–15.7)
IMM Granulocytes #: 0 10*3/uL (ref 0.0–0.0)
IMM Granulocytes: 0.2 %
Lymph # K/uL: 1.3 10*3/uL (ref 1.2–3.7)
Lymphocyte %: 15.4 %
MCH: 25 pg — ABNORMAL LOW (ref 26–32)
MCHC: 30 g/dL — ABNORMAL LOW (ref 32–36)
MCV: 82 fL (ref 79–95)
Mono # K/uL: 0.3 10*3/uL (ref 0.2–0.9)
Monocyte %: 3.3 %
Neut # K/uL: 6.9 10*3/uL — ABNORMAL HIGH (ref 1.6–6.1)
Nucl RBC # K/uL: 0 10*3/uL (ref 0.0–0.0)
Nucl RBC %: 0 /100 WBC (ref 0.0–0.2)
Platelets: 489 10*3/uL — ABNORMAL HIGH (ref 160–370)
RBC: 3.9 MIL/uL (ref 3.9–5.2)
RDW: 16.1 % — ABNORMAL HIGH (ref 11.7–14.4)
Seg Neut %: 80.2 %
WBC: 8.6 10*3/uL (ref 4.0–10.0)

## 2019-08-02 LAB — POCT URINE PREGNANCY: Preg Test,UR POC: NEGATIVE

## 2019-08-02 LAB — BASIC METABOLIC PANEL
Anion Gap: 13 (ref 7–16)
CO2: 27 mmol/L (ref 20–28)
Calcium: 9.5 mg/dL (ref 8.8–10.2)
Chloride: 103 mmol/L (ref 96–108)
Creatinine: 0.79 mg/dL (ref 0.51–0.95)
GFR,Black: 113 *
GFR,Caucasian: 98 *
Glucose: 99 mg/dL (ref 60–99)
Lab: 9 mg/dL (ref 6–20)
Potassium: 3.7 mmol/L (ref 3.3–5.1)
Sodium: 143 mmol/L (ref 133–145)

## 2019-08-02 LAB — RUQ PANEL (ED ONLY)
ALT: 17 U/L (ref 0–35)
AST: 14 U/L (ref 0–35)
Albumin: 4.3 g/dL (ref 3.5–5.2)
Alk Phos: 55 U/L (ref 35–105)
Amylase: 31 U/L (ref 28–100)
Bilirubin,Direct: <0.2 mg/dL (ref 0.0–0.3)
Bilirubin,Total: 0.2 mg/dL (ref 0.0–1.2)
Lipase: 12 U/L — ABNORMAL LOW (ref 13–60)
Total Protein: 7.3 g/dL (ref 6.3–7.7)

## 2019-08-02 LAB — BLOOD BANK HOLD LAVENDER

## 2019-08-02 MED ORDER — DEXTROSE 5 % FLUSH FOR PUMPS *I*
0.0000 mL/h | INTRAVENOUS | Status: DC | PRN
Start: 2019-08-02 — End: 2019-08-02

## 2019-08-02 MED ORDER — ONDANSETRON 4 MG PO TBDP *I*
4.0000 mg | ORAL_TABLET | Freq: Three times a day (TID) | ORAL | 0 refills | Status: DC | PRN
Start: 2019-08-02 — End: 2019-08-26

## 2019-08-02 MED ORDER — ONDANSETRON HCL 2 MG/ML IV SOLN *I*
4.0000 mg | Freq: Once | INTRAMUSCULAR | Status: AC
Start: 2019-08-02 — End: 2019-08-02
  Administered 2019-08-02: 4 mg via INTRAVENOUS
  Filled 2019-08-02: qty 2

## 2019-08-02 MED ORDER — PANTOPRAZOLE SODIUM 40 MG IV SOLR *I*
40.0000 mg | Freq: Once | INTRAVENOUS | Status: AC
Start: 2019-08-02 — End: 2019-08-02
  Administered 2019-08-02: 40 mg via INTRAVENOUS
  Filled 2019-08-02: qty 10

## 2019-08-02 MED ORDER — MORPHINE SULFATE 4 MG/ML IV SOLN *WRAPPED*
4.0000 mg | Freq: Once | INTRAVENOUS | Status: AC
Start: 2019-08-02 — End: 2019-08-02
  Administered 2019-08-02: 4 mg via INTRAVENOUS
  Filled 2019-08-02: qty 1

## 2019-08-02 MED ORDER — DIPHENHYDRAMINE-LIDOCAINE-MAALOX (BMX/FIRST MOUTHWASH) *WRAPPED*
15.0000 mL | Freq: Once | ORAL | Status: DC
Start: 2019-08-02 — End: 2019-08-02

## 2019-08-02 MED ORDER — SODIUM CHLORIDE 0.9 % IV BOLUS *I*
1000.0000 mL | Freq: Once | Status: AC
Start: 2019-08-02 — End: 2019-08-02
  Administered 2019-08-02: 1000 mL via INTRAVENOUS

## 2019-08-02 MED ORDER — SODIUM CHLORIDE 0.9 % FLUSH FOR PUMPS *I*
0.0000 mL/h | INTRAVENOUS | Status: DC | PRN
Start: 2019-08-02 — End: 2019-08-02

## 2019-08-02 MED ORDER — SODIUM CHLORIDE 0.9 % IV SOLN WRAPPED *I*
125.0000 mL/h | Status: DC
Start: 2019-08-02 — End: 2019-08-02
  Administered 2019-08-02: 125 mL/h via INTRAVENOUS

## 2019-08-02 NOTE — Discharge Instructions (Signed)
You were seen in Emergency Department today for assessment.    Please continue to closely monitor your symptoms.     You need to follow up with your primary care physician, please call within 48 hours to schedule an appointment to be seen in 3-5 days.     Return to the Emergency Department if your condition worsens.    If you were given narcotic pain medication today or any medication that made you feel drowsy, do not drive or operate machinery for 24 hours.    I have sent medication to your pharmacy for you; please pick this up and take as directed.

## 2019-08-02 NOTE — ED Provider Notes (Signed)
History     Chief Complaint   Patient presents with    Abdominal Pain     Patient is a 33 y/o female with PMH of anemia, TOA, abdominal pain, and PID who presents today with the concern of nausea and vomiting.  She states that for the past 3 days, she has been experiencing nausea, vomiting with episodic hemoptysis, and diarrhea.  She states that she has approximately 3 episodes of nonbloody diarrhea per day; denies any recent antibiotics, sick contacts, travel, suspicious food intake, or personal history of c diff colitis.  She states that she has had similar symptoms in the past with no known diagnosis, states symptoms improved with time.  She states that she has been taking tylenol and tramadol.  She describes the pain as a burning sensation and mostly affecting her upper abdomen.  She denies any blood associated with BM/melena, fevers, chills, chest pain, SOB, upper respiratory symptoms, urinary symptoms, vaginal bleeding, or vaginal discharge.  She notes that she was tested for COVID after her symptoms started, approximately 4 days ago, and she states this was negative.        History provided by:  Patient  Language interpreter used: No        Medical/Surgical/Family History     Past Medical History:   Diagnosis Date    Anemia     Depression     GI bleed     hemetesis        Patient Active Problem List   Diagnosis Code    Anemia D64.9    Abdominal pain, unspecified abdominal location R10.9    TOA (tubo-ovarian abscess) N70.93    s/p laparoscopic LS with extensive LOA Z98.890    Abdominal pain R10.9    Pelvic inflammatory disease N73.9    Pelvic pain R10.2            Past Surgical History:   Procedure Laterality Date    CESAREAN SECTION, UNSPECIFIED      CHOLECYSTECTOMY      GALLBLADDER SURGERY      PR ESOPHAGOGASTRODUODENOSCOPY TRANSORAL DIAGNOSTIC N/A 01/29/2019    Procedure: EGD (ESOPHAGOGASTRODUODENOSCOPY);  Surgeon: Maryann Alar, MD;  Location: [facility];  Service: GI    PR  LAP,DIAGNOSTIC ABDOMEN N/A 02/03/2019    Procedure: LAPAROSCOPY, DIAGNOSTIC, EXTENSIVE LYSIS OF ADHESIONS;  Surgeon: Thompsoncarrillo, Raquel Sarna, MD;  Location: [facility];  Service: OBGYN     Family History   Problem Relation Age of Onset    Colon cancer Paternal Grandmother     Breast cancer Maternal Grandmother 34          Social History     Tobacco Use    Smoking status: Never Smoker    Smokeless tobacco: Never Used   Substance Use Topics    Alcohol use: Not Currently     Frequency: Never    Drug use: Never     Living Situation     Questions Responses    Patient lives with Family    Homeless     Caregiver for other family member     External Services     Employment Employed    Domestic Violence Risk                 Review of Systems   Review of Systems   Constitutional: Negative for appetite change, chills, fatigue and fever.   HENT: Negative for ear pain, rhinorrhea, sneezing and sore throat.    Eyes: Negative for visual  disturbance.   Respiratory: Negative for cough, chest tightness and shortness of breath.    Cardiovascular: Negative for chest pain.   Gastrointestinal: Positive for abdominal pain, diarrhea, nausea and vomiting. Negative for abdominal distention, anal bleeding, blood in stool and constipation.   Genitourinary: Negative for dysuria, frequency, urgency, vaginal bleeding and vaginal discharge.   Musculoskeletal: Negative for neck pain and neck stiffness.   Skin: Negative for rash.   Neurological: Negative for dizziness, syncope, weakness, light-headedness, numbness and headaches.       Physical Exam     Triage Vitals  Triage Start: Start, (08/02/19 1217)   First Recorded BP: 120/85, Resp: 16, Temp: 35.9 C (96.6 F), Temp src: TEMPORAL Oxygen Therapy SpO2: 97 %, O2 Device: None (Room air), Heart Rate: 101, (08/02/19 1219)  .  First Pain Reported  0-10 Scale: 8, Pain Location/Orientation: Abdomen, Pain Descriptors: Aching, (08/02/19 1219)       Physical Exam  Vitals signs and nursing note  reviewed.   Constitutional:       General: She is not in acute distress.     Appearance: She is not diaphoretic.   HENT:      Head: Normocephalic and atraumatic.      Right Ear: External ear normal.      Left Ear: External ear normal.      Nose: Nose normal.   Eyes:      Conjunctiva/sclera: Conjunctivae normal.   Neck:      Musculoskeletal: Normal range of motion and neck supple.   Cardiovascular:      Rate and Rhythm: Normal rate.   Pulmonary:      Effort: Pulmonary effort is normal. No respiratory distress.   Abdominal:      General: There is no distension.      Palpations: Abdomen is soft.      Tenderness: There is abdominal tenderness in the epigastric area. There is no guarding or rebound.   Musculoskeletal: Normal range of motion.         General: No tenderness or deformity.   Skin:     General: Skin is warm and dry.      Coloration: Skin is not pale.      Findings: No erythema or rash.   Neurological:      Mental Status: She is alert and oriented to person, place, and time.      Cranial Nerves: No cranial nerve deficit.      Motor: No abnormal muscle tone.      Coordination: Coordination normal.         Medical Decision Making   Patient seen by me on:  08/02/2019    Assessment:  33 y/o female presenting with concern of upper abdominal pain with associated nausea, vomiting, and diarrhea.  On exam, no significant acute abnormal findings aside from tenderness with palpation of epigastric region without guarding, rigidity, or rebound tenderness.     Differential diagnosis:  PUD, gastritis, viral illness, dehydration, electrolyte abnormality, not consistent with perforation/cholecystitis     Plan:  Diagnostic: Labs Reviewed  CBC AND DIFFERENTIAL - Abnormal; Notable for the following components:     Hemoglobin                    9.6 (*)                Hematocrit                    32 (*)  MCH                           25 (*)                 MCHC                          30 (*)                 RDW                            16.1 (*)               Platelets                     489 (*)                Neut # K/uL                   6.9 (*)             All other components within normal limits  RUQ PANEL (ED ONLY) - Abnormal; Notable for the following components:     Lipase                        12 (*)              All other components within normal limits  BASIC METABOLIC PANEL  BLOOD BANK HOLD LAVENDER  POCT URINE PREGNANCY      I have discussed the option of both obtaining further testing including imaging as well as foregoing this, as well as the benefits and risks of either decision, particularly in the setting of 3 CT scans so far just in 2020.  I state that I can not definitely exclude any underlying abnormalities without further testing.  Patient verbalizes understanding of risks and benefits of both decisions and does not wish to pursue further assessment.     Therapeutic: IVF, zofran, protonix, morphine, BMX     Independent review of: Existing labs, chart/prior records    ED Course and Disposition:  No emesis while in the ED.      Patient tolerated PO trial without difficulty.     I discussed the results with the patient.  I addressed all questions and concerns.  The patient verbalized understanding and agreement with outpatient conservative management and close follow up with PCP.  Reviewed strict return precautions.     I have reassessed this patient several times during time in the ED.    Plan to discharge pt home with instructions to follow-up with PCP  Pt was instructed to return to the ED with worsening symptoms or concerns   Pt is agreeable with this plan and understands return precautions             Benny Lennert, [place], [place], [place]  08/02/19 1524

## 2019-08-02 NOTE — ED Triage Notes (Signed)
Pt reports she was hospitalized several days ago with abdominal pain +N/V/D. States she is vomiting blood. States the abdominal pain is generalized and wraps around to her back.        Triage Note   Annette Preston

## 2019-08-03 LAB — UNMAPPED LAB RESULTS
Basophil # (HT): 0.1 10 3/uL — NL (ref 0.0–0.2)
Basophil % (HT): 1 % — NL (ref 0–3)
Eosinophil # (HT): 0.1 10 3/uL — NL (ref 0.0–0.6)
Eosinophil % (HT): 1 % — NL (ref 0–5)
Hematocrit (HT): 32 % — ABNORMAL LOW (ref 35–47)
Hemoglobin (HGB) (HT): 9.6 g/dL — ABNORMAL LOW (ref 12.0–16.0)
Lymphocyte # (HT): 1.9 10 3/uL — NL (ref 1.0–4.8)
Lymphocyte % (HT): 19 % — NL (ref 15–45)
MCHC (HT): 30.4 g/dL — ABNORMAL LOW (ref 31.0–37.5)
MCV (HT): 80 fL — NL (ref 80–100)
Mean Corpuscular Hemoglobin (MCH) (HT): 24.4 pg — ABNORMAL LOW (ref 26.0–34.0)
Monocyte # (HT): 0.4 10 3/uL — NL (ref 0.1–1.0)
Monocyte % (HT): 4 % — NL (ref 0–15)
Neutrophil # (HT): 7.4 10 3/uL — NL (ref 1.8–8.0)
Platelets (HT): 455 10 3/uL — ABNORMAL HIGH (ref 150–450)
RBC (HT): 3.94 10 6/uL — NL (ref 3.80–5.20)
RDW (HT): 16 % — ABNORMAL HIGH (ref 0.0–15.2)
Seg Neut % (HT): 75 % — NL (ref 45–75)
WBC (HT): 9.9 10 3/uL — NL (ref 4.0–11.0)

## 2019-08-08 LAB — UNMAPPED LAB RESULTS
Basophil # (HT): 0 10 3/uL — NL (ref 0.0–0.2)
Basophil % (HT): 1 % — NL (ref 0–3)
Eosinophil # (HT): 0.1 10 3/uL — NL (ref 0.0–0.6)
Eosinophil % (HT): 1 % — NL (ref 0–5)
Hematocrit (HT): 34 % — ABNORMAL LOW (ref 35–47)
Hemoglobin (HGB) (HT): 9.6 g/dL — ABNORMAL LOW (ref 12.0–16.0)
Lymphocyte # (HT): 1.4 10 3/uL — NL (ref 1.0–4.8)
Lymphocyte % (HT): 22 % — NL (ref 15–45)
MCHC (HT): 28.6 g/dL — ABNORMAL LOW (ref 31.0–37.5)
MCV (HT): 82 fL — NL (ref 80–100)
Mean Corpuscular Hemoglobin (MCH) (HT): 23.5 pg — ABNORMAL LOW (ref 26.0–34.0)
Monocyte # (HT): 0.3 10 3/uL — NL (ref 0.1–1.0)
Monocyte % (HT): 5 % — NL (ref 0–15)
Neutrophil # (HT): 4.6 10 3/uL — NL (ref 1.8–8.0)
Platelets (HT): 421 10 3/uL — NL (ref 150–450)
RBC (HT): 4.09 10 6/uL — NL (ref 3.80–5.20)
RDW (HT): 15.9 % — ABNORMAL HIGH (ref 0.0–15.2)
Seg Neut % (HT): 71 % — NL (ref 45–75)
WBC (HT): 6.4 10 3/uL — NL (ref 4.0–11.0)

## 2019-08-08 LAB — EKG 12-LEAD
P: 8 deg
PR: 147 ms
QRS: 52 deg
QRSD: 100 ms
QT: 388 ms
QTc: 475 ms
Rate: 90 {beats}/min
T: 121 deg

## 2019-08-09 LAB — UNMAPPED LAB RESULTS
Basophil # (HT): 0 10 3/uL — NL (ref 0.0–0.2)
Basophil % (HT): 0 % — NL (ref 0–3)
Eosinophil # (HT): 0.1 10 3/uL — NL (ref 0.0–0.6)
Eosinophil % (HT): 1 % — NL (ref 0–5)
Hematocrit (HT): 31 % — ABNORMAL LOW (ref 35–47)
Hemoglobin (HGB) (HT): 8.8 g/dL — ABNORMAL LOW (ref 12.0–16.0)
Lymphocyte # (HT): 2.5 10 3/uL — NL (ref 1.0–4.8)
Lymphocyte % (HT): 32 % — NL (ref 15–45)
MCHC (HT): 28.6 g/dL — ABNORMAL LOW (ref 31.0–37.5)
MCV (HT): 83 fL — NL (ref 80–100)
Mean Corpuscular Hemoglobin (MCH) (HT): 23.7 pg — ABNORMAL LOW (ref 26.0–34.0)
Monocyte # (HT): 0.4 10 3/uL — NL (ref 0.1–1.0)
Monocyte % (HT): 5 % — NL (ref 0–15)
Neutrophil # (HT): 4.6 10 3/uL — NL (ref 1.8–8.0)
Platelets (HT): 466 10 3/uL — ABNORMAL HIGH (ref 150–450)
RBC (HT): 3.71 10 6/uL — ABNORMAL LOW (ref 3.80–5.20)
RDW (HT): 15.9 % — ABNORMAL HIGH (ref 0.0–15.2)
Seg Neut % (HT): 61 % — NL (ref 45–75)
WBC (HT): 7.6 10 3/uL — NL (ref 4.0–11.0)

## 2019-08-10 LAB — UNMAPPED LAB RESULTS
Hematocrit (HT): 33 % — ABNORMAL LOW (ref 35–47)
Hemoglobin (HGB) (HT): 9.3 g/dL — ABNORMAL LOW (ref 12.0–16.0)
MCHC (HT): 28.6 g/dL — ABNORMAL LOW (ref 31.0–37.5)
MCV (HT): 83 fL — NL (ref 80–100)
Mean Corpuscular Hemoglobin (MCH) (HT): 23.6 pg — ABNORMAL LOW (ref 26.0–34.0)
Platelets (HT): 472 10 3/uL — ABNORMAL HIGH (ref 150–450)
RBC (HT): 3.94 10 6/uL — NL (ref 3.80–5.20)
RDW (HT): 15.9 % — ABNORMAL HIGH (ref 0.0–15.2)
WBC (HT): 7.4 10 3/uL — NL (ref 4.0–11.0)

## 2019-08-11 LAB — UNMAPPED LAB RESULTS
Hematocrit (HT): 32 % — ABNORMAL LOW (ref 35–47)
Hemoglobin (HGB) (HT): 9.3 g/dL — ABNORMAL LOW (ref 12.0–16.0)
MCHC (HT): 29.2 g/dL — ABNORMAL LOW (ref 31.0–37.5)
MCV (HT): 83 fL — NL (ref 80–100)
Mean Corpuscular Hemoglobin (MCH) (HT): 24.3 pg — ABNORMAL LOW (ref 26.0–34.0)
Platelets (HT): 466 10 3/uL — ABNORMAL HIGH (ref 150–450)
RBC (HT): 3.83 10 6/uL — NL (ref 3.80–5.20)
RDW (HT): 16.5 % — ABNORMAL HIGH (ref 0.0–15.2)
WBC (HT): 9.5 10 3/uL — NL (ref 4.0–11.0)

## 2019-08-12 LAB — UNMAPPED LAB RESULTS
Basophil # (HT): 0 10 3/uL — NL (ref 0.0–0.2)
Basophil % (HT): 0 % — NL (ref 0–3)
Eosinophil # (HT): 0.1 10 3/uL — NL (ref 0.0–0.6)
Eosinophil % (HT): 1 % — NL (ref 0–5)
Hematocrit (HT): 30 % — ABNORMAL LOW (ref 35–47)
Hemoglobin (HGB) (HT): 8.6 g/dL — ABNORMAL LOW (ref 12.0–16.0)
Lymphocyte # (HT): 2.4 10 3/uL — NL (ref 1.0–4.8)
Lymphocyte % (HT): 29 % — NL (ref 15–45)
MCHC (HT): 28.4 g/dL — ABNORMAL LOW (ref 31.0–37.5)
MCV (HT): 83 fL — NL (ref 80–100)
Mean Corpuscular Hemoglobin (MCH) (HT): 23.4 pg — ABNORMAL LOW (ref 26.0–34.0)
Monocyte # (HT): 0.5 10 3/uL — NL (ref 0.1–1.0)
Monocyte % (HT): 6 % — NL (ref 0–15)
Neutrophil # (HT): 5.4 10 3/uL — NL (ref 1.8–8.0)
Platelets (HT): 483 10 3/uL — ABNORMAL HIGH (ref 150–450)
RBC (HT): 3.67 10 6/uL — ABNORMAL LOW (ref 3.80–5.20)
RDW (HT): 16.2 % — ABNORMAL HIGH (ref 0.0–15.2)
Seg Neut % (HT): 64 % — NL (ref 45–75)
WBC (HT): 8.5 10 3/uL — NL (ref 4.0–11.0)

## 2019-08-13 LAB — UNMAPPED LAB RESULTS
Basophil # (HT): 0 10 3/uL — NL (ref 0.0–0.2)
Basophil % (HT): 0 % — NL (ref 0–3)
Eosinophil # (HT): 0.1 10 3/uL — NL (ref 0.0–0.6)
Eosinophil % (HT): 1 % — NL (ref 0–5)
Hematocrit (HT): 31 % — ABNORMAL LOW (ref 35–47)
Hemoglobin (HGB) (HT): 9 g/dL — ABNORMAL LOW (ref 12.0–16.0)
Lymphocyte # (HT): 2.6 10 3/uL — NL (ref 1.0–4.8)
Lymphocyte % (HT): 32 % — NL (ref 15–45)
MCHC (HT): 29.2 g/dL — ABNORMAL LOW (ref 31.0–37.5)
MCV (HT): 82 fL — NL (ref 80–100)
Mean Corpuscular Hemoglobin (MCH) (HT): 24.1 pg — ABNORMAL LOW (ref 26.0–34.0)
Monocyte # (HT): 0.5 10 3/uL — NL (ref 0.1–1.0)
Monocyte % (HT): 6 % — NL (ref 0–15)
Neutrophil # (HT): 4.9 10 3/uL — NL (ref 1.8–8.0)
Platelets (HT): 463 10 3/uL — ABNORMAL HIGH (ref 150–450)
RBC (HT): 3.74 10 6/uL — ABNORMAL LOW (ref 3.80–5.20)
RDW (HT): 16.7 % — ABNORMAL HIGH (ref 0.0–15.2)
Seg Neut % (HT): 60 % — NL (ref 45–75)
WBC (HT): 8.1 10 3/uL — NL (ref 4.0–11.0)

## 2019-08-15 LAB — UNMAPPED LAB RESULTS
Basophil # (HT): 0.1 10 3/uL — NL (ref 0.0–0.2)
Basophil % (HT): 0.7 % — NL (ref 0.0–1.8)
Eosinophil # (HT): 0.1 10 3/uL — NL (ref 0.0–0.5)
Eosinophil % (HT): 0.9 % — NL (ref 0.0–7.0)
Hematocrit (HT): 32 % — ABNORMAL LOW (ref 34.0–47.0)
Hemoglobin (HGB) (HT): 10.1 g/dL — ABNORMAL LOW (ref 11.5–16.0)
Lymphocyte # (HT): 2 10 3/uL — NL (ref 0.9–3.8)
Lymphocyte % (HT): 27.6 % — NL (ref 17.0–44.0)
MCHC (HT): 31.6 g/dL — ABNORMAL LOW (ref 32.0–36.0)
MCV (HT): 76.4 FL — ABNORMAL LOW (ref 81.0–99.0)
Mean Corpuscular Hemoglobin (MCH) (HT): 24.1 pg — ABNORMAL LOW (ref 26.0–34.0)
Monocyte # (HT): 0.5 10 3/uL — NL (ref 0.2–1.0)
Monocyte % (HT): 7 % — NL (ref 4.0–12.0)
Neutrophil # (HT): 4.7 10 3/uL — NL (ref 1.5–7.7)
Platelets (HT): 576 10 3/uL — ABNORMAL HIGH (ref 140–400)
RBC (HT): 4.19 10 6/uL — NL (ref 3.80–5.20)
RDW (HT): 15.9 % — ABNORMAL HIGH (ref 11.5–15.0)
Seg Neut % (HT): 63.8 % — NL (ref 40.0–75.0)
WBC (HT): 7.4 10 3/uL — NL (ref 4.0–10.8)

## 2019-08-20 LAB — UNMAPPED LAB RESULTS
Basophil # (HT): 0 10 3/uL — NL (ref 0.0–0.2)
Basophil % (HT): 0 % — NL (ref 0–3)
Eosinophil # (HT): 0.1 10 3/uL — NL (ref 0.0–0.6)
Eosinophil % (HT): 1 % — NL (ref 0–5)
Hematocrit (HT): 37 % — NL (ref 35–47)
Hemoglobin (HGB) (HT): 10.8 g/dL — ABNORMAL LOW (ref 12.0–16.0)
Lymphocyte # (HT): 2.1 10 3/uL — NL (ref 1.0–4.8)
Lymphocyte % (HT): 23 % — NL (ref 15–45)
MCHC (HT): 28.9 g/dL — ABNORMAL LOW (ref 31.0–37.5)
MCV (HT): 82 fL — NL (ref 80–100)
Mean Corpuscular Hemoglobin (MCH) (HT): 23.8 pg — ABNORMAL LOW (ref 26.0–34.0)
Monocyte # (HT): 0.5 10 3/uL — NL (ref 0.1–1.0)
Monocyte % (HT): 6 % — NL (ref 0–15)
Neutrophil # (HT): 6.5 10 3/uL — NL (ref 1.8–8.0)
Platelets (HT): 608 10 3/uL — ABNORMAL HIGH (ref 150–450)
RBC (HT): 4.54 10 6/uL — NL (ref 3.80–5.20)
RDW (HT): 16.3 % — ABNORMAL HIGH (ref 0.0–15.2)
Seg Neut % (HT): 70 % — NL (ref 45–75)
WBC (HT): 9.2 10 3/uL — NL (ref 4.0–11.0)

## 2019-08-25 ENCOUNTER — Observation Stay
Admission: EM | Admit: 2019-08-25 | Discharge: 2019-08-26 | Disposition: A | Discharge status: Home / Self Care | Payer: Medicaid Other | Source: Ambulatory Visit | Attending: Emergency Medicine | Admitting: Emergency Medicine

## 2019-08-25 ENCOUNTER — Emergency Department: Payer: Medicaid Other | Admitting: Radiology

## 2019-08-25 ENCOUNTER — Encounter: Payer: Self-pay | Admitting: Emergency Medicine

## 2019-08-25 DIAGNOSIS — R102 Pelvic and perineal pain: Secondary | ICD-10-CM

## 2019-08-25 DIAGNOSIS — R1032 Left lower quadrant pain: Secondary | ICD-10-CM

## 2019-08-25 DIAGNOSIS — Z20828 Contact with and (suspected) exposure to other viral communicable diseases: Secondary | ICD-10-CM | POA: Insufficient documentation

## 2019-08-25 DIAGNOSIS — D649 Anemia, unspecified: Secondary | ICD-10-CM | POA: Diagnosis present

## 2019-08-25 DIAGNOSIS — N730 Acute parametritis and pelvic cellulitis: Principal | ICD-10-CM | POA: Insufficient documentation

## 2019-08-25 DIAGNOSIS — Z9889 Other specified postprocedural states: Secondary | ICD-10-CM | POA: Insufficient documentation

## 2019-08-25 DIAGNOSIS — R109 Unspecified abdominal pain: Secondary | ICD-10-CM | POA: Diagnosis present

## 2019-08-25 LAB — CBC AND DIFFERENTIAL
Baso # K/uL: 0 10*3/uL (ref 0.0–0.1)
Basophil %: 0.3 %
Eos # K/uL: 0.1 10*3/uL (ref 0.0–0.4)
Eosinophil %: 0.8 %
Hematocrit: 33 % — ABNORMAL LOW (ref 34–45)
Hemoglobin: 9.9 g/dL — ABNORMAL LOW (ref 11.2–15.7)
IMM Granulocytes #: 0 10*3/uL (ref 0.0–0.0)
IMM Granulocytes: 0.3 %
Lymph # K/uL: 1.9 10*3/uL (ref 1.2–3.7)
Lymphocyte %: 21.5 %
MCH: 25 pg/cell — ABNORMAL LOW (ref 26–32)
MCHC: 30 g/dL — ABNORMAL LOW (ref 32–36)
MCV: 83 fL (ref 79–95)
Mono # K/uL: 0.4 10*3/uL (ref 0.2–0.9)
Monocyte %: 4.9 %
Neut # K/uL: 6.4 10*3/uL — ABNORMAL HIGH (ref 1.6–6.1)
Nucl RBC # K/uL: 0 10*3/uL (ref 0.0–0.0)
Nucl RBC %: 0 /100 WBC (ref 0.0–0.2)
Platelets: 559 10*3/uL — ABNORMAL HIGH (ref 160–370)
RBC: 4 MIL/uL (ref 3.9–5.2)
RDW: 16.5 % — ABNORMAL HIGH (ref 11.7–14.4)
Seg Neut %: 72.2 %
WBC: 8.9 10*3/uL (ref 4.0–10.0)

## 2019-08-25 LAB — PLASMA PROF 7 (ED ONLY)
Anion Gap,PL: 12 (ref 7–16)
CO2,Plasma: 20 mmol/L (ref 20–28)
Chloride,Plasma: 103 mmol/L (ref 96–108)
Creatinine: 0.61 mg/dL (ref 0.51–0.95)
GFR,Black: 137 *
GFR,Caucasian: 119 *
Glucose,Plasma: 91 mg/dL (ref 60–99)
Sodium,Plasma: 135 mmol/L (ref 133–145)
UN,Plasma: 10 mg/dL (ref 6–20)

## 2019-08-25 LAB — URINALYSIS REFLEX TO CULTURE
Glucose,UA: NEGATIVE mg/dL
Leuk Esterase,UA: NEGATIVE
Nitrite,UA: NEGATIVE
Specific Gravity,UA: >=1.045 — AB (ref 1.002–1.030)
pH,UA: 6.5 (ref 5.0–8.0)

## 2019-08-25 LAB — UNMAPPED LAB RESULTS
Basophil # (HT): 0 10 3/uL — NL (ref 0.0–0.2)
Basophil % (HT): 1 % — NL (ref 0–3)
Eosinophil # (HT): 0.1 10 3/uL — NL (ref 0.0–0.6)
Eosinophil % (HT): 1 % — NL (ref 0–5)
Hematocrit (HT): 34 % — ABNORMAL LOW (ref 35–47)
Hemoglobin (HGB) (HT): 9.8 g/dL — ABNORMAL LOW (ref 12.0–16.0)
Lymphocyte # (HT): 1.9 10 3/uL — NL (ref 1.0–4.8)
Lymphocyte % (HT): 24 % — NL (ref 15–45)
MCHC (HT): 29.1 g/dL — ABNORMAL LOW (ref 31.0–37.5)
MCV (HT): 82 fL — NL (ref 80–100)
Mean Corpuscular Hemoglobin (MCH) (HT): 24 pg — ABNORMAL LOW (ref 26.0–34.0)
Monocyte # (HT): 0.5 10 3/uL — NL (ref 0.1–1.0)
Monocyte % (HT): 6 % — NL (ref 0–15)
Neutrophil # (HT): 5.6 10 3/uL — NL (ref 1.8–8.0)
Platelets (HT): 543 10 3/uL — ABNORMAL HIGH (ref 150–450)
RBC (HT): 4.09 10 6/uL — NL (ref 3.80–5.20)
RDW (HT): 16.3 % — ABNORMAL HIGH (ref 0.0–15.2)
Seg Neut % (HT): 69 % — NL (ref 45–75)
WBC (HT): 8.1 10 3/uL — NL (ref 4.0–11.0)

## 2019-08-25 LAB — RUQ PANEL (ED ONLY)
Albumin: 4.4 g/dL (ref 3.5–5.2)
Amylase: 43 U/L (ref 28–100)
Bilirubin,Direct: <0.2 mg/dL (ref 0.0–0.3)
Bilirubin,Total: <0.2 mg/dL (ref 0.0–1.2)
Lipase: 23 U/L (ref 13–60)
Total Protein: 8.1 g/dL — ABNORMAL HIGH (ref 6.3–7.7)

## 2019-08-25 LAB — PREGNANCY TEST, SERUM: Preg,Serum: NEGATIVE

## 2019-08-25 LAB — HOLD BLUE

## 2019-08-25 MED ORDER — DEXTROSE 5 % FLUSH FOR PUMPS *I*
0.0000 mL/h | INTRAVENOUS | Status: DC | PRN
Start: 2019-08-25 — End: 2019-08-26

## 2019-08-25 MED ORDER — SODIUM CHLORIDE 0.9 % IV BOLUS *I*
1000.0000 mL | Freq: Once | Status: AC
Start: 2019-08-25 — End: 2019-08-25
  Administered 2019-08-25: 1000 mL via INTRAVENOUS

## 2019-08-25 MED ORDER — MORPHINE SULFATE 4 MG/ML IV SOLN *WRAPPED*
4.0000 mg | Freq: Once | INTRAVENOUS | Status: AC
Start: 2019-08-25 — End: 2019-08-25
  Administered 2019-08-25: 4 mg via INTRAVENOUS
  Filled 2019-08-25: qty 1

## 2019-08-25 MED ORDER — SODIUM CHLORIDE 0.9 % FLUSH FOR PUMPS *I*
0.0000 mL/h | INTRAVENOUS | Status: DC | PRN
Start: 2019-08-25 — End: 2019-08-26

## 2019-08-25 NOTE — ED Provider Notes (Addendum)
History     Chief Complaint   Patient presents with    Abdominal Pain     Annette Preston is a 33 y.o. female with hx of left TOA 01/2019, tx for PID on 08/20/2019 who presents with persistent RLQ pain.     Pt reports intermittent lower abdominal pain for past few weeks. She was treated for pyelonephritis on 8/28 then seen in ED on 9/9 and treated for PID (doxy and flagyl) which she reports she finished taking although course was Rx for 14 days. She then went to [facility] today for persistent pain therefore a CT was done and did not show any acute abnormalities, just stable appearance of uterus with loss of fat plane which may be secondary to scarring vs endometrial implants. Due to persistent pain pt reports she had telemedicine with her OB GYN who told her to return to ED for further evaluation due to her hx of TOA. Pt reports fever yesterday to 102.7 which resolved with tylenol. She denies cough or shortness of breath. Notes nausea and reports of scant hematemesis. Reports she is currently on her menstrual period. Denies possibility of STD. Notes covid was negative 2 weeks ago.            Medical/Surgical/Family History     Past Medical History:   Diagnosis Date    Anemia     Depression     GI bleed     hemetesis        Patient Active Problem List   Diagnosis Code    Anemia D64.9    Abdominal pain, unspecified abdominal location R10.9    TOA (tubo-ovarian abscess) N70.93    s/p laparoscopic LS with extensive LOA Z98.890    Abdominal pain R10.9    Pelvic inflammatory disease N73.9    Pelvic pain R10.2            Past Surgical History:   Procedure Laterality Date    CESAREAN SECTION, UNSPECIFIED      CHOLECYSTECTOMY      GALLBLADDER SURGERY      PR ESOPHAGOGASTRODUODENOSCOPY TRANSORAL DIAGNOSTIC N/A 01/29/2019    Procedure: EGD (ESOPHAGOGASTRODUODENOSCOPY);  Surgeon: Maryann Alar, MD;  Location: [facility];  Service: GI    PR LAP,DIAGNOSTIC ABDOMEN N/A 02/03/2019    Procedure: LAPAROSCOPY,  DIAGNOSTIC, EXTENSIVE LYSIS OF ADHESIONS;  Surgeon: Thompsoncarrillo, Raquel Sarna, MD;  Location: [facility];  Service: OBGYN     Family History   Problem Relation Age of Onset    Colon cancer Paternal Grandmother     Breast cancer Maternal Grandmother 60          Social History     Tobacco Use    Smoking status: Never Smoker    Smokeless tobacco: Never Used   Substance Use Topics    Alcohol use: Not Currently     Frequency: Never    Drug use: Never     Living Situation     Questions Responses    Patient lives with Family    Homeless     Caregiver for other family member     External Services     Employment Employed    Domestic Violence Risk                 Review of Systems   Review of Systems   Constitutional: Positive for appetite change, chills, fatigue and fever. Negative for activity change.   HENT: Negative for sore throat and trouble swallowing.    Eyes:  Negative for photophobia and visual disturbance.   Respiratory: Negative for cough, chest tightness and shortness of breath.    Cardiovascular: Negative for chest pain and palpitations.   Gastrointestinal: Positive for abdominal pain, nausea and vomiting. Negative for abdominal distention, anal bleeding, blood in stool, constipation, diarrhea and rectal pain.   Genitourinary: Positive for pelvic pain, vaginal bleeding and vaginal discharge. Negative for decreased urine volume, difficulty urinating, flank pain and hematuria.   Musculoskeletal: Negative for myalgias.   Neurological: Negative for syncope, weakness, light-headedness and headaches.   Psychiatric/Behavioral: The patient is not nervous/anxious.        Physical Exam     Triage Vitals  Triage Start: Start, (08/25/19 1818)   First Recorded BP: 131/86, Resp: 18, Temp: 36.2 C (97.2 F), Temp src: TEMPORAL Oxygen Therapy SpO2: 100 %, Oximetry Source: Lt Hand, O2 Device: None (Room air), Heart Rate: 93, (08/25/19 1818)  .  First Pain Reported  0-10 Scale: 9, (08/25/19 1818)       Physical Exam  Vitals  signs and nursing note reviewed.   Constitutional:       Appearance: She is obese.      Comments: Pt appears uncomfortable   HENT:      Head: Normocephalic.   Eyes:      Extraocular Movements: Extraocular movements intact.   Cardiovascular:      Rate and Rhythm: Normal rate and regular rhythm.   Pulmonary:      Effort: Pulmonary effort is normal.      Breath sounds: Normal breath sounds.   Abdominal:      General: A surgical scar is present. Bowel sounds are normal. There is no distension.      Palpations: Abdomen is soft.      Tenderness: There is abdominal tenderness in the right lower quadrant and suprapubic area. There is no right CVA tenderness, left CVA tenderness or rebound.   Genitourinary:     Adnexa:         Right: Tenderness present.       Comments: Performed with Legrand Rams RN    Unable to visualize cervix due to patient reporting pain during exam    Scant blood in vaginal vault, no significant discharge noted  Skin:     General: Skin is warm.   Neurological:      General: No focal deficit present.      Mental Status: She is alert and oriented to person, place, and time.   Psychiatric:         Mood and Affect: Mood normal.         Behavior: Behavior normal.         Medical Decision Making   Patient seen by me on:  08/25/2019    Assessment:  [name] is a 33 y.o. female with hx of left TOA 01/2019, tx for PID on 08/20/2019 who presents with persistent RLQ pain. Multiple recent ED visits and treatment for pyelonephritis and PID. Looks uncomfortable. Unable to do full pelvic exam/visualize cervix due to pt inability to tolerate exam. Afebrile here.     Differential diagnosis:  TOA, pelvic abscess, PID, UTI, pyelo, less likely appy (negative CT today), endometriosis    Plan:    Orders Placed This Encounter      Chlamydia plasmid DNA amplification      GC culture      Vaginitis screen: DNA probe      Urinalysis reflex to culture      US pelvic complete  CBC and differential      Plasma profile 7 (Adult ED  only)      RUQ panel (ED only)      Pregnancy Test, Serum      Hold blue      Urinalysis with reflex to Microscopic UA and reflex to Bacterial Culture      COVID-19 PCR      Urine microscopic ([id number])      Insert peripheral IV    - ns bolus  - morphine  - follow labs and imaging  - signed out to Dr. Ladonna Snide pending [place] to look for TOA, due to persistant symptoms and possibility of PID that was not fully resolved anticipate admission              Wynn Maudlin, PA      APP Review:    I had face-to-face interaction with the patient on 08/25/2019.    I was asked by APP to see this patient due to the complexity of the current medical presentation.      I have reviewed and agree with the above documentation and, in addition:    Our plan is 33 yo female who presents with RLQ abdominal pain.  Patient has recent history of PID and pyelonephritis, and stopped her course of antibiotics 2 days ago per patient.  On exam, patient appears uncomfortable with ttp to RLQ.  Will check pelvic ultrasound, and likely admit for IV antibiotics due to concern for PID that has failed outpatient therapy. .        Author:  Geraldine Contras, MD       Wynn Maudlin, [place]  08/26/19 [place]       Geraldine Contras, MD  08/26/19 [phone]

## 2019-08-25 NOTE — First Provider Contact (Signed)
ED First Provider Contact Note    Initial provider evaluation performed by   ED First Provider Contact     Date/Time Event User Comments    08/25/19 1822 ED First Provider Contact Carlon Chaloux Initial Face to Face Provider Contact          Vital signs reviewed.    33 y.o. female with PMH of multiple abdominal surgeries, h/o PID, h/o tubo-ovarian abscess presents with complaint of RLQ pain for the past month. Has been seen multiple times for this, last seen today at [facility]. Now states she has had nausea/vomiting with bloody emesis for 3 days.     Assessment: RLQ pain    Orders placed:  LABS   CT completed yesterday shows uterine scarring vs endometrial implants. No other acute findings.      Patient requires further evaluation.     Rondall Allegra, PA, 08/25/2019, 6:22 PM     Rondall Allegra, [place]  08/25/19 1824

## 2019-08-25 NOTE — ED Triage Notes (Signed)
Pt reports that she has been having x1 month of abdominal pain and was seen at [facility] today. Pt reports CT was done and was dx with a Cyst, discharged, and to follow up with OBGYN. Pt continued to have pain and notified OBGYN which advised to her to return to another ED for tx.        Triage Note   Rodman Comp, RN

## 2019-08-26 ENCOUNTER — Emergency Department: Payer: Medicaid Other

## 2019-08-26 ENCOUNTER — Encounter: Payer: Self-pay | Admitting: Nurse Practitioner

## 2019-08-26 DIAGNOSIS — R1031 Right lower quadrant pain: Secondary | ICD-10-CM

## 2019-08-26 DIAGNOSIS — R109 Unspecified abdominal pain: Secondary | ICD-10-CM

## 2019-08-26 DIAGNOSIS — N730 Acute parametritis and pelvic cellulitis: Secondary | ICD-10-CM

## 2019-08-26 LAB — TIBC
Iron: 27 ug/dL — ABNORMAL LOW (ref 34–165)
TIBC: 377 ug/dL (ref 250–450)
Transferrin Saturation: 7 % — ABNORMAL LOW (ref 15–50)

## 2019-08-26 LAB — RUQ PANEL (ED ONLY)
ALT: 13 U/L (ref 0–35)
AST: 13 U/L (ref 0–35)
Albumin: 4.1 g/dL (ref 3.5–5.2)
Alk Phos: 58 U/L (ref 35–105)
Amylase: 40 U/L (ref 28–100)
Bilirubin,Direct: <0.2 mg/dL (ref 0.0–0.3)
Bilirubin,Total: 0.3 mg/dL (ref 0.0–1.2)
Lipase: 12 U/L — ABNORMAL LOW (ref 13–60)
Total Protein: 7.2 g/dL (ref 6.3–7.7)

## 2019-08-26 LAB — PLASMA PROF 7 (ED ONLY)
Anion Gap,PL: 9 (ref 7–16)
CO2,Plasma: 24 mmol/L (ref 20–28)
Chloride,Plasma: 106 mmol/L (ref 96–108)
Creatinine: 0.72 mg/dL (ref 0.51–0.95)
GFR,Black: 127 *
GFR,Caucasian: 110 *
Glucose,Plasma: 99 mg/dL (ref 60–99)
Potassium,Plasma: 3.9 mmol/L (ref 3.4–4.7)
Sodium,Plasma: 139 mmol/L (ref 133–145)
UN,Plasma: 10 mg/dL (ref 6–20)

## 2019-08-26 LAB — COVID-19 PCR

## 2019-08-26 LAB — CBC AND DIFFERENTIAL
Baso # K/uL: 0 10*3/uL (ref 0.0–0.1)
Basophil %: 0.5 %
Eos # K/uL: 0.1 10*3/uL (ref 0.0–0.4)
Eosinophil %: 1.3 %
Hematocrit: 31 % — ABNORMAL LOW (ref 34–45)
Hemoglobin: 9.1 g/dL — ABNORMAL LOW (ref 11.2–15.7)
IMM Granulocytes #: 0 10*3/uL (ref 0.0–0.0)
IMM Granulocytes: 0.4 %
Lymph # K/uL: 2.6 10*3/uL (ref 1.2–3.7)
Lymphocyte %: 31.6 %
MCH: 24 pg/cell — ABNORMAL LOW (ref 26–32)
MCHC: 29 g/dL — ABNORMAL LOW (ref 32–36)
MCV: 82 fL (ref 79–95)
Mono # K/uL: 0.5 10*3/uL (ref 0.2–0.9)
Monocyte %: 5.6 %
Neut # K/uL: 5.1 10*3/uL (ref 1.6–6.1)
Nucl RBC # K/uL: 0 10*3/uL (ref 0.0–0.0)
Nucl RBC %: 0 /100 WBC (ref 0.0–0.2)
Platelets: 479 10*3/uL — ABNORMAL HIGH (ref 160–370)
RBC: 3.8 MIL/uL — ABNORMAL LOW (ref 3.9–5.2)
RDW: 16.3 % — ABNORMAL HIGH (ref 11.7–14.4)
Seg Neut %: 60.6 %
WBC: 8.4 10*3/uL (ref 4.0–10.0)

## 2019-08-26 LAB — CHLAMYDIA PLASMID DNA AMPLIFICATION: Chlamydia Plasmid DNA Amplification: 0

## 2019-08-26 LAB — VAGINITIS SCREEN: DNA PROBE: Vaginitis Screen:DNA Probe: 0

## 2019-08-26 LAB — URINE MICROSCOPIC (IQ200)

## 2019-08-26 LAB — TRANSFERRIN: Transferrin: 279 mg/dL (ref 200–360)

## 2019-08-26 LAB — FERRITIN: Ferritin: 15 ng/mL (ref 10–120)

## 2019-08-26 LAB — COVID-19 NAAT (PCR): COVID-19 NAAT (PCR): NEGATIVE

## 2019-08-26 MED ORDER — HYDROMORPHONE HCL PF 1 MG/ML IJ SOLN *WRAPPED*
1.0000 mg | INTRAMUSCULAR | Status: DC | PRN
Start: 2019-08-26 — End: 2019-08-26
  Administered 2019-08-26: 1 mg via INTRAVENOUS
  Filled 2019-08-26: qty 1

## 2019-08-26 MED ORDER — CEFOXITIN SODIUM 1 GM IV SOLR *I*
2000.0000 mg | Freq: Once | INTRAVENOUS | Status: AC
Start: 2019-08-26 — End: 2019-08-26
  Administered 2019-08-26: 2000 mg via INTRAVENOUS
  Filled 2019-08-26: qty 20

## 2019-08-26 MED ORDER — HYDROMORPHONE HCL PF 0.5 MG/0.5 ML IJ SOLN *I*
0.5000 mg | Freq: Once | INTRAMUSCULAR | Status: AC
Start: 2019-08-26 — End: 2019-08-26
  Administered 2019-08-26: 0.5 mg via INTRAVENOUS
  Filled 2019-08-26: qty 0.5

## 2019-08-26 MED ORDER — PROMETHAZINE HCL 12.5 MG PO TABS *I*
12.5000 mg | ORAL_TABLET | ORAL | 0 refills | Status: AC | PRN
Start: 2019-08-26 — End: ?

## 2019-08-26 MED ORDER — PROMETHAZINE HCL 25 MG/ML IJ SOLN *I*
12.5000 mg | Freq: Four times a day (QID) | INTRAMUSCULAR | Status: DC | PRN
Start: 2019-08-26 — End: 2019-08-26
  Administered 2019-08-26: 12.5 mg via INTRAVENOUS
  Filled 2019-08-26: qty 1

## 2019-08-26 MED ORDER — LACTATED RINGERS IV SOLN *I*
100.0000 mL/h | INTRAVENOUS | Status: DC
Start: 2019-08-26 — End: 2019-08-26
  Administered 2019-08-26 (×2): 100 mL/h
  Administered 2019-08-26: 100 mL/h via INTRAVENOUS
  Administered 2019-08-26: 100 mL/h

## 2019-08-26 MED ORDER — KETOROLAC TROMETHAMINE 30 MG/ML IJ SOLN *I*
15.0000 mg | Freq: Once | INTRAMUSCULAR | Status: DC | PRN
Start: 2019-08-26 — End: 2019-08-26

## 2019-08-26 MED ORDER — DOXYCYCLINE HYCLATE 100 MG PO TABS *I*
100.0000 mg | ORAL_TABLET | Freq: Once | ORAL | Status: AC
Start: 2019-08-26 — End: 2019-08-26
  Administered 2019-08-26: 100 mg via ORAL
  Filled 2019-08-26: qty 1

## 2019-08-26 MED ORDER — FERROUS SULFATE 325 (65 FE) MG PO TABS *WRAPPED* *I*
325.0000 mg | ORAL_TABLET | Freq: Three times a day (TID) | ORAL | Status: DC
Start: 2019-08-26 — End: 2019-08-26
  Filled 2019-08-26 (×4): qty 1

## 2019-08-26 MED ORDER — PANTOPRAZOLE SODIUM 40 MG PO TBEC *I*
40.0000 mg | DELAYED_RELEASE_TABLET | Freq: Every day | ORAL | 0 refills | Status: AC
Start: 2019-08-26 — End: 2019-09-25

## 2019-08-26 MED ORDER — IBUPROFEN 800 MG PO TABS *I*
800.0000 mg | ORAL_TABLET | Freq: Three times a day (TID) | ORAL | 0 refills | Status: AC | PRN
Start: 2019-08-26 — End: ?

## 2019-08-26 NOTE — ED Notes (Signed)
Discharge instructions reviewed with patient, patient verbalized understanding. PIV removed, proper clothing in place, follow up care reviewed. Patient to be discharged to home and will be driven home via her husband.

## 2019-08-26 NOTE — Discharge Instructions (Addendum)
Your urin at this time does not show any signs of infection. A full culture has been sent, is still pending, and we will call you if this is positive.    DRINK plenty of fluids, water, juices, sport drinks, etc.,at least 2-3 quarts per day    Get plenty of REST*    Continue your regular medications as directed*, including the ones you were given on 08/20/19 that you needed to take for 2 full weeks* ------ if you have NOT started these medications yet, you may hold off for now* and when you see your regular GYN, may discuss this with her.    Please call your regular  GYN MD to schedule a follow-up appointment for 3-5 days from now*    MOTRIN (Ibuprofen) 800 mg tablets -- take 1(one) tablet by mouth every 8-12 hours as needed for pain/soreness -- take with food/meal*    PHENERGAN 12.5 mg tablets  ---- take 1(one) tablet by mouth every 6 -8 hours if needed for nausea ----DO NOT DRIVE WHEN USING*    TYLENOL --- use as directed, if needed for pain    PROTONIX 40 mg tablets -- take 1(one) tablet by mouth DAILY*    Call MD/return to Emergency for any fevers over 100.5, chills, nausea/vomiting/unable to stay hydrated*, worsening pain, changing pain, problems/concerns

## 2019-08-26 NOTE — ED Notes (Signed)
Pt arrived on EOU from ED via stretcher, transferred to hospital bed without difficulty, gait steady, pt does endorse some dizziness/lightheadedness since just receiving dilaudid IV prior to leaving ED. Pt reporting nausea, no emesis, covering provider aware. Pt currently rating RLQ pain as a 7 out of 10, no guarding, abdomen soft. Pt oriented to call light system, location of BR and POC, encouraged pt to use call light when getting OOB if dizziness continues, voiced understanding. Call light left within reach.

## 2019-08-26 NOTE — Provider Consult (Addendum)
GYNECOLOGY CONSULTATION HISTORY & PHYSICAL      Consult Requested By: ED  Consult Question: r/u TOA    HPI: Annette Preston is a 33 y.o. [id number] female with a history of PID and TOA who presents to the ED for RLQ pain. Pt recently was diagnosed with PID at the ED at [facility] on 9/9 and was prescribed a 14d course of doxycycline and flagyl. She presented to the ED at [facility] yesterday for this pain and performed a CT that showed no acute findings. She states she had a fever of 102 at home yesterday that resolved with tylenol as well as an episode of vaginal bleeding that also stopped. She notes she has irregular bleeding and is currently being worked up for that by her primary OBGYN at the [facility]. She had a telemedicine visit with her OB/GYN yesterday for this continued pain and was advised to go to the ED for further evaluation for pelvic pain and fevers.    COURSE (at [facility]) by chart review  8/16-18 ED hiatal hernia no abx  8/23 ED Rx 7 days nitrofurantoin for suspected cystitis and 7 days flagyl for BV  8/25 Rx antifungal for yeast infection  8/26 telemedicine visit: ciprofloxacin 500 MG Oral tablet x7 days for pyelonephritis  8/28-9/2 Inpatient admission for pyelonephritis Fever, intractable nausea and diarrhea. Abdominal pain. CT scan: non-obstructing kidney stone, endometriosis (?), normal ovaries. initiated empirically on IV antibiotics including meropenem. discharged on oral nitrofurantoin 5 days  9/9 ED as above  9/14 ED as above      Gynecologic History   Chronic pelvic pain  H/o PID  H/o L salpingectomy and L TOA in 01/2019    Obstetric History     OB History   Gravida Para Term Preterm AB Living   5 5 5     5    SAB TAB Ectopic Multiple Live Births           5      # Outcome Date GA Lbr Len/2nd Weight Sex Delivery Anes PTL Lv   5 Term 2015           4 Term 2013     CS-Unspec      3 Term 2010     CS-Unspec      2 Term 2009     CS-Unspec      1 Term      CS-Unspec          Past Medical History     Past Medical  History:   Diagnosis Date    Anemia     Depression     GI bleed     hemetesis       Past Surgical History     Past Surgical History:   Procedure Laterality Date    CESAREAN SECTION, UNSPECIFIED      CHOLECYSTECTOMY      GALLBLADDER SURGERY      PR ESOPHAGOGASTRODUODENOSCOPY TRANSORAL DIAGNOSTIC N/A 01/29/2019    Procedure: EGD (ESOPHAGOGASTRODUODENOSCOPY);  Surgeon: Maryann Alar, MD;  Location: [place];  Service: GI    PR LAP,DIAGNOSTIC ABDOMEN N/A 02/03/2019    Procedure: LAPAROSCOPY, DIAGNOSTIC, EXTENSIVE LYSIS OF ADHESIONS;  Surgeon: Anderson Malta, MD;  Location: [place] [facility];  Service: OBGYN       Obstetric History     OB History   Gravida Para Term Preterm AB Living   5 5 5     5    SAB TAB Ectopic Multiple Live Births  5      # Outcome Date GA Lbr Len/2nd Weight Sex Delivery Anes PTL Lv   5 Term 2015           4 Term 2013     CS-Unspec      3 Term 2010     CS-Unspec      2 Term 2009     CS-Unspec      1 Term      CS-Unspec          Social History     Social History     Tobacco Use    Smoking status: Never Smoker    Smokeless tobacco: Never Used   Substance Use Topics    Alcohol use: Not Currently     Frequency: Never    Drug use: Never       Family History     Family History            Breast cancer Maternal Grandmother    Colon cancer Paternal Grandmother          Allergies     Allergies   Allergen Reactions    Gabapentin Anaphylaxis       Current Home Medications   Medication reconciliation performed at the time of evaluation. See Med Rec portion of patient's chart.     Review of Systems   All other ROS negative unless otherwise noted in HPI.       Physical Exam     Vitals:    08/25/19 1818 08/25/19 2017 08/25/19 2317   BP: 131/86 120/76 118/82   BP Location: Left arm Left arm Right arm   Pulse: 93 85 85   Resp: 18 16 18    Temp: 36.2 C (97.2 F) 36.3 C (97.3 F) 35.7 C (96.3 F)   TempSrc: Temporal Temporal Temporal   SpO2: 100% 100% 99%   Weight: 92.1 kg (203 lb)      Height: 1.549 m (5\' 1" )         General: alert and oriented, in no acute distress  Mental: alert and oriented  HEENT: Normocephalic, atraumatic  Cardiovascular: Not assessed  Respiratory: normal respiratory effot  Abdomen: soft, mild tenderness to palpation over RLQ without guarding or rebound  Extremities/Skin: No edema noted  Speculum exam:   Vulva: normal appearing vulva with no masses, tenderness or lesions  Vagina: normal appearing vagina with normal color and discharge, no lesions, small amount of blood in vaginal vault  Cervix: small appearing cervix without discharge or lesions  Bimanual exam:    Uterus: mobile, non-tender   Cervix: mild tenderness on palpation, but no cervical motion tenderness   Right adnexa: nontender   Left adnexa: nontender        Labs   All labs reviewed and notable for:  Hct 31  WBC 8.4      Imaging   CT pelvis @ [facility] (9/14)  IMPRESSION:   1. No evidence of acute appendicitis.  2. Stable appearance of the uterus with loss of fat plane between the uterus and the anterior abdominal wall. Findings may be secondary to scarring versus endometrial implants along an area of scarring. As before, MRI may be obtained for additional evaluation as clinically indicated.    Corrected by: Mathis Fare on 08/25/2019 12:37 PM    Transabdominal US (9/15):  Reading Radiologist:   Felecia Shelling, MD  Gwenevere Abbot, MD  Signed By:  Felecia Shelling, MD on 08/26/2019  1:50 AM  Somewhat limited exam as patient was unable to tolerate transvaginal evaluation.       Sonographically normal-appearing ovaries on the transabdominal ultrasound. Both arterial and venous flow are seen in the right ovary in this patient with right lower quadrant pain.       Venous flow is not seen in the left ovary which I suspect is due to its smaller size and transabdominal technique. This does not absolutely rule out left ovarian torsion which is a clinical diagnosis, but this is thought to be very unlikely.             Assessment & Recommendations     Annette Preston is a 33 y.o. [id number] female who presents with chronic pelvic pain. Imaging rules out tubo-ovarian abscess. Given she has been afebrile during her ED course, no leukocytosis, no cervical motion tenderness, it is unlikely that she has persisting PID. She does have an extensive history of chronic pelvic pain and likely her symptoms are consistent with this.    Chronic Pelvic Pain  - Cleared from GYN perspective for discharge home with plan for follow up with either primary OB/GYN or chronic pelvic pain clinic   - Precautions reviewed including instructions to return to the ED if increased pain or pain not controlled by medications, nausea, vomiting, chest pain, shortness of breath, fever, chills, difficulty or pain while urinating.   - Further care per ED provider    Please do not hesitate to page the GYN consult pager [facility] [phone]) with any further questions or concerns.    Evaluated with Dr. Erin Hearing, PGY3.    Written with contributions by Tawanna Solo, MS3.    Margreta Journey, MD  OB/GYN R1  Pager: [phone]        R3 attestation:    I agree with the resident note above. 33 yo with chronic pelvic pain presenting with sharp intermittent RLQ pain. Patient admitted to [facility] in Feb 2020 for PID with left TOA, failed IV antibiotic treatment and had a diagnostic laparoscopy with lysis of adhesions and left salpingectomy. She has had multiple clinic and ED visits for pelvic pain since that admission. She was recently admitted to [facility] from 8/28-9/2 with concern for pyelonephritis and received IV antibiotics. She was seen in the ED on 9/9 and diagnosed with PID. The patient is a poor historian and was unable to tell us if she has completed her antibiotic treatment. In the ED, VSS. Afebrile. No leukocytosis. Benign abdominal exam. No cervical motion tenderness. No vaginal discharge present on exam. GC/CT neg 9/9 at [facility]. Repeat cultures obtained by ED provider at [facility]. Given  afebrile without leukocytosis and normal pelvic exam, clinical picture is not consistent with PID. Patient has bilateral pinpoint tenderness of levator ani muscles consistent with pelvic floor tension myalgia. Recommend follow-up outpatient with OB/GYN for pelvic pain.     D/w Dr. Erin Hearing, MD  Obstetrics and Gynecology, PGY-3  Pager # [phone]

## 2019-08-26 NOTE — ED Obs Notes (Addendum)
ED OBSERVATION DISCHARGE NOTE    Patient seen by me today, 08/26/2019 at 9:48 AM.    Current patient status: Observation    Subjective:  " I was wondering if I can have some Tramadol when I go home because it is the only thing that works or my pain.      Observation Stay Includes:  33 y.o.female who is [id number] female with PMH of recent (01/2019) PID and Left TOA (patinet had surgical removal of Left fallopian tube, and lysis of adhesions to partially treat TOA. (past surgeries include cholecystectomy, and C-section) who presents to [facility] ED for RLQ pain. Patient was recently was diagnosed with PID at [facility] 9/9,  and was prescribed a 14 day course of Doxycycline and Flagyl, ut reports she stopped taking the medications 2 days ago (08/23/19).Prior to [facility] evaluations on 08/20/19, and 08/25/19, patient had numerous visits:   1)8/23 ED Rx 7 days Nitrofurantoin for suspected cystitis and 7 days Flagyl for BV  8/25 Rx Antifungal for yeast infection  8/26 telemedicine visit: Ciprofloxacin 500 MG Oral tablet x7 days for pyelonephritis  8/28-9/2 Inpatient admission for pyelonephritis Fever, intractable nausea and diarrhea. Abdominal pain. CT scan shows: non-obstructing kidney stone, questionable  Endometriosis, but normal ovaries. Patient had IV antibiotics empirically on IV antibiotics including Meropenem  After 6 days of treatment, patinet was discharged on oral Nitrofurantoin 5 days which she states she completed as directed.    Before coming to [facility] ED on 08/25/19 (yesterday), patient had presented to [facility] ED for same RLQ abdominal pain, was evaluated,including CT Abdomen/Pelvis that showed no acute findings. She reports she had a fever of 102 at home yesterday that resolved with Tylenol, also reports an isolated episode of vaginal bleeding that also stopped with no re-occurrence while in ED or ED OBS.  Patient does state she has had irregular bleeding in past, which is currently being worked up by her primary OBGYN at the  [facility].    Patient continues to remain afebrile, hemodynamically stable, NAD, with diffuse , mild diffuse abdominal pain.  Patient continues to have no leukocytosis, limited pelvic exam in ED due to discomfort, so had Pelvic US which showed: again some limitations due to pain,  normal appearing overies, but no venous flow was seen in Left ovary.  Patient placed in ED OBS for further monitoring, treatment.    GYN consult call due to US findings, and recommended:  Likely Chronic Pelvic Pain  - Cleared fromGYN perspective for discharge home with plan for follow up with either primary OB/GYN or chronic pelvic pain clinic   - Precautions reviewed including instructions to returntothe ED if increasedpain or pain not controlled by medications, nausea, vomiting, chest pain, shortness of breath, fever, chills, difficulty or pain while urinating.  - Further care per ED provider  Evaluated with Dr. Erin Hearing, PGY3.  Patient remains hemodynamically stable, and is comfortable with going home.  Patient tolerating oral foods and fluids well.  Discussed with patient no need for Tramadol, best to use NSAID with foot/meals, patient states she understands this.  Will start patient on Protonix 40 mg Daily as well.     Patient reports she is comfortable following up with her normal GYN, which she intends to do.    Spoke with patient's  pharmacy, who reports she has not picked up her Flagyl/Doxycycline prescriptions from 08/20/19.    Chief Complaint   Patient presents with    Abdominal Pain     Last Nursing  documented pain:  0-10 Scale: 0 (08/26/19 0820)    Vitals:    Patient Vitals for the past 24 hrs:   BP Temp Temp src Pulse Resp SpO2 Height Weight   08/26/19 0820 105/64 36.1 C (97 F) TEMPORAL 77 16 100 % -- --   08/26/19 0641 126/87 36.7 C (98.1 F) Oral 75 18 100 % -- --   08/26/19 0615 (!) 111/92 36 C (96.8 F) -- 82 16 100 % -- --   08/25/19 2317 118/82 35.7 C (96.3 F) TEMPORAL 85 18 99 % -- --   08/25/19  2017 120/76 36.3 C (97.3 F) TEMPORAL 85 16 100 % -- --   08/25/19 1818 131/86 36.2 C (97.2 F) TEMPORAL 93 18 100 % 1.549 m ('5\' 1"'$ ) 92.1 kg (203 lb)     Physical Exam:  Physical Exam  Vitals signs and nursing note reviewed.   Constitutional:       General: She is not in acute distress.     Appearance: She is well-developed. She is obese. She is not ill-appearing, toxic-appearing or diaphoretic.      Comments: Patinet sitting up, eating breakfast, NAD   HENT:      Head: Normocephalic and atraumatic.      Mouth/Throat:      Mouth: Mucous membranes are moist.      Pharynx: Oropharynx is clear. No pharyngeal swelling or oropharyngeal exudate.   Eyes:      General: No scleral icterus.     Extraocular Movements: Extraocular movements intact.      Pupils: Pupils are equal, round, and reactive to light.   Cardiovascular:      Rate and Rhythm: Normal rate and regular rhythm.      Heart sounds: Normal heart sounds. No murmur. No friction rub. No gallop.    Pulmonary:      Effort: Pulmonary effort is normal. No respiratory distress.      Breath sounds: Normal breath sounds. No stridor. No wheezing, rhonchi or rales.   Chest:      Chest wall: No tenderness.   Abdominal:      General: Abdomen is protuberant. Bowel sounds are normal. There is no distension. There are no signs of injury.      Palpations: Abdomen is soft. There is no shifting dullness, fluid wave, hepatomegaly or splenomegaly.      Tenderness: There is generalized abdominal tenderness. There is no right CVA tenderness, left CVA tenderness, guarding or rebound. Negative signs include Murphy's sign, Rovsing's sign, McBurney's sign, psoas sign and obturator sign.      Hernia: No hernia is present. There is no hernia in the umbilical area, ventral area, left inguinal area, right femoral area, left femoral area or right inguinal area.      Comments: Abdomen large, rounded, soft, diffuse , mild palpation tenderness, no rebound, no guarding  No CVA tenderness   Skin:      General: Skin is warm and dry.      Capillary Refill: Capillary refill takes less than 2 seconds.      Coloration: Skin is not cyanotic, jaundiced, mottled or pale.      Findings: No erythema or rash.      Comments: Chronic skin changes BLE with varicosities   Neurological:      General: No focal deficit present.      Mental Status: She is alert and oriented to person, place, and time.      Cranial Nerves: No cranial nerve deficit.  Motor: No weakness.      Comments: BLE peripheral neuropathy   Psychiatric:         Mood and Affect: Mood normal. Mood is not anxious or depressed.         Behavior: Behavior normal.       Labs:   All labs in the last 24 hours:   Recent Results (from the past 24 hour(s))   CBC and differential    Collection Time: 08/25/19  8:29 PM   Result Value Ref Range    WBC 8.9 4.0 - 10.0 THOU/uL    RBC 4.0 3.9 - 5.2 MIL/uL    Hemoglobin 9.9 (L) 11.2 - 15.7 g/dL    Hematocrit 33 (L) 34 - 45 %    MCV 83 79 - 95 fL    MCH 25 (L) 26 - 32 pg/cell    MCHC 30 (L) 32 - 36 g/dL    RDW 16.5 (H) 11.7 - 14.4 %    Platelets 559 (H) 160 - 370 THOU/uL    Seg Neut % 72.2 %    Lymphocyte % 21.5 %    Monocyte % 4.9 %    Eosinophil % 0.8 %    Basophil % 0.3 %    Neut # K/uL 6.4 (H) 1.6 - 6.1 THOU/uL    Lymph # K/uL 1.9 1.2 - 3.7 THOU/uL    Mono # K/uL 0.4 0.2 - 0.9 THOU/uL    Eos # K/uL 0.1 0.0 - 0.4 THOU/uL    Baso # K/uL 0.0 0.0 - 0.1 THOU/uL    Nucl RBC % 0.0 0.0 - 0.2 /100 WBC    Nucl RBC # K/uL 0.0 0.0 - 0.0 THOU/uL    IMM Granulocytes # 0.0 0.0 - 0.0 THOU/uL    IMM Granulocytes 0.3 %   Plasma profile 7 (Adult ED only)    Collection Time: 08/25/19  8:29 PM   Result Value Ref Range    Chloride,Plasma 103 96 - 108 mmol/L    CO2,Plasma 20 20 - 28 mmol/L    Potassium,Plasma CANCELED 3.4 - 4.7 mmol/L    Sodium,Plasma 135 133 - 145 mmol/L    Anion Gap,PL 12 7 - 16    UN,Plasma 10 6 - 20 mg/dL    Creatinine 0.61 0.51 - 0.95 mg/dL    GFR,Caucasian 119 *    GFR,Black 137 *    Glucose,Plasma 91 60 - 99 mg/dL   RUQ  panel (ED only)    Collection Time: 08/25/19  8:29 PM   Result Value Ref Range    Amylase 43 28 - 100 U/L    Lipase 23 13 - 60 U/L    Total Protein 8.1 (H) 6.3 - 7.7 g/dL    Albumin 4.4 3.5 - 5.2 g/dL    Bilirubin,Total <0.2 0.0 - 1.2 mg/dL    Bilirubin,Direct <0.2 0.0 - 0.3 mg/dL    Alk Phos CANCELED 35 - 105 U/L    AST CANCELED 0 - 35 U/L    ALT CANCELED 0 - 35 U/L   Pregnancy Test, Serum    Collection Time: 08/25/19  8:29 PM   Result Value Ref Range    Preg,Serum NEG NEGATIVE   Hold blue    Collection Time: 08/25/19  8:29 PM   Result Value Ref Range    Hold Blue HOLD TUBE    Urinalysis reflex to culture    Collection Time: 08/25/19 10:59 PM   Result Value Ref Range  Color, UA Yellow Yellow-Dk Yellow    Appearance,UR Clear Clear    Specific Gravity,UA >=1.045 (!) 1.002 - 1.030    Leuk Esterase,UA NEG NEGATIVE    Nitrite,UA NEG NEGATIVE    pH,UA 6.5 5.0 - 8.0    Protein,UA 1+ (!) NEGATIVE mg/dL    Glucose,UA NEG NEGATIVE mg/dL    Ketones, UA Trace (!) NEGATIVE    Blood,UA 2+ (!) NEGATIVE   Urine microscopic (iq200)    Collection Time: 08/25/19 10:59 PM   Result Value Ref Range    RBC,UA 0-3 0 - 3 /hpf    WBC,UA 0-5 0 - 5 /hpf    Bacteria,UA 2+ (!) NEGATIVE    Hyaline Casts,UA 0-5 0 - 5 /lpf    Squam Epithel,UA 4+ (!) 0-1+    Trans Epithel,UA 1+ 0-1+   Chlamydia plasmid DNA amplification    Collection Time: 08/25/19 11:17 PM    Specimen: Cervix   Result Value Ref Range    Chlamydia Plasmid DNA Amplification Lab Cancel    GC culture    Collection Time: 08/25/19 11:17 PM    Specimen: Cervix   Result Value Ref Range    GC Culture .    Vaginitis screen: DNA probe    Collection Time: 08/25/19 11:17 PM    Specimen: Vagina   Result Value Ref Range    Vaginitis Screen:DNA Probe .    Plasma profile 7    Collection Time: 08/26/19  6:10 AM   Result Value Ref Range    Chloride,Plasma 106 96 - 108 mmol/L    CO2,Plasma 24 20 - 28 mmol/L    Potassium,Plasma 3.9 3.4 - 4.7 mmol/L    Sodium,Plasma 139 133 - 145 mmol/L    Anion  Gap,PL 9 7 - 16    UN,Plasma 10 6 - 20 mg/dL    Creatinine 0.72 0.51 - 0.95 mg/dL    GFR,Caucasian 110 *    GFR,Black 127 *    Glucose,Plasma 99 60 - 99 mg/dL   CBC and differential    Collection Time: 08/26/19  6:10 AM   Result Value Ref Range    WBC 8.4 4.0 - 10.0 THOU/uL    RBC 3.8 (L) 3.9 - 5.2 MIL/uL    Hemoglobin 9.1 (L) 11.2 - 15.7 g/dL    Hematocrit 31 (L) 34 - 45 %    MCV 82 79 - 95 fL    MCH 24 (L) 26 - 32 pg/cell    MCHC 29 (L) 32 - 36 g/dL    RDW 16.3 (H) 11.7 - 14.4 %    Platelets 479 (H) 160 - 370 THOU/uL    Seg Neut % 60.6 %    Lymphocyte % 31.6 %    Monocyte % 5.6 %    Eosinophil % 1.3 %    Basophil % 0.5 %    Neut # K/uL 5.1 1.6 - 6.1 THOU/uL    Lymph # K/uL 2.6 1.2 - 3.7 THOU/uL    Mono # K/uL 0.5 0.2 - 0.9 THOU/uL    Eos # K/uL 0.1 0.0 - 0.4 THOU/uL    Baso # K/uL 0.0 0.0 - 0.1 THOU/uL    Nucl RBC % 0.0 0.0 - 0.2 /100 WBC    Nucl RBC # K/uL 0.0 0.0 - 0.0 THOU/uL    IMM Granulocytes # 0.0 0.0 - 0.0 THOU/uL    IMM Granulocytes 0.4 %   Right upper quadrant panel (RUQ panel)    Collection Time: 08/26/19  6:10 AM   Result Value Ref Range    Amylase 40 28 - 100 U/L    Lipase 12 (L) 13 - 60 U/L    Total Protein 7.2 6.3 - 7.7 g/dL    Albumin 4.1 3.5 - 5.2 g/dL    Bilirubin,Total 0.3 0.0 - 1.2 mg/dL    Bilirubin,Direct <0.2 0.0 - 0.3 mg/dL    Alk Phos 58 35 - 105 U/L    AST 13 0 - 35 U/L    ALT 13 0 - 35 U/L     *Note: Due to a large number of results and/or encounters for the requested time period, some results have not been displayed. A complete set of results can be found in Results Review.     Imaging findings: US Transvaginal    Result Date: 08/26/2019   Somewhat limited exam as patient was unable to tolerate transvaginal evaluation. Sonographically normal-appearing ovaries on the transabdominal ultrasound. Both arterial and venous flow are seen in the right ovary in this patient with right lower quadrant pain. Venous flow is not seen in the left ovary which I suspect is due to its smaller size and  transabdominal technique. This does not absolutely rule out left ovarian torsion which is a clinical diagnosis, but this is thought to be very unlikely. END OF IMPRESSION I have personally reviewed the images and the Resident's/Fellow's interpretation and agree with or edited the findings. [facility] submits this DICOM format image data and final report to the [facility], an independent secure electronic health information exchange, on a reciprocally searchable basis (with patient authorization) for a minimum of 12 months after exam date.    US Pelvic Complete    Result Date: 08/26/2019   Somewhat limited exam as patient was unable to tolerate transvaginal evaluation. Sonographically normal-appearing ovaries on the transabdominal ultrasound. Both arterial and venous flow are seen in the right ovary in this patient with right lower quadrant pain. Venous flow is not seen in the left ovary which I suspect is due to its smaller size and transabdominal technique. This does not absolutely rule out left ovarian torsion which is a clinical diagnosis, but this is thought to be very unlikely. END OF IMPRESSION I have personally reviewed the images and the Resident's/Fellow's interpretation and agree with or edited the findings. [facility] submits this DICOM format image data and final report to the [facility], an independent secure electronic health information exchange, on a reciprocally searchable basis (with patient authorization) for a minimum of 12 months after exam date.    Consults: GYN    Assessment: Patient with continued diffuse , mild abdominal pain, no further vaginal bleeding, symptoms, no UTI symptoms,urine showed no growth so far at [facility], and here,  (U/A appears contaminated), and pelvis US which showed no venous flow to Left ovary, re-evaluated by GYN, who believe no acute process ongoing, likely chronic pelvic pain.  Decline Rx for Tramadol which patient requested, will use Motrin/Tylenol, heating pack,  and GYN follow-up. Patient in agreement with this plan.  Please complete the medications you received for PID on 08/20/19 as directed.     Plan:  Disposition: Home     Follow-up:  within the next 2-5 days. with GYN    Smoking Cessation: NA    Diagnoses that have been ruled out:   None   Diagnoses that are still under consideration:   None   Final diagnoses:   PID (acute pelvic inflammatory disease)  Author: Stanton Kidney ANN Gale Journey, NP  Note created: 08/26/2019  at: 9:28 AM     Gale Journey, Audrea Muscat, NP  08/26/19 1109       Gale Journey Audrea Muscat, NP  08/26/19 1128

## 2019-08-26 NOTE — ED Obs Notes (Addendum)
ED OBSERVATION ADMISSION NOTE    Patient seen by me today, 08/26/2019 at 3:11 AM    Current patient status: Observation    History     Chief Complaint   Patient presents with    Abdominal Pain   This is a 33 year old female with a PMH of left TOA, and PID who presents with RLQ pain. She reports that she has been having intermittent abdominal pain for a couple of weeks. She was treated for pyelonephritis on 08/08/19 and recently diagnosed with PID at [facility] ED on 9/9. She was prescribed a course of Doxycycline and Flagyl x 14 days. She states that she completed the course 3 days ago. She was seen at [facility] ED yesterday for persistent RLQ pain. She had a CT A/P performed that showed no acute findings. She reports a fever 102.4 F and vaginal bleeding yesterday. Currently, she denies vaginal bleeding and vaginal discharge. She called her OB/GYN at the [facility] due to persistent pain and was advised to go back to the ED for concern for TOA.     History provided by:  Patient and medical records  History limited by: N/A.  Language interpreter used: No        Past Medical History:   Diagnosis Date    Anemia     Depression     GI bleed     hemetesis       Past Surgical History:   Procedure Laterality Date    CESAREAN SECTION, UNSPECIFIED      CHOLECYSTECTOMY      GALLBLADDER SURGERY      PR ESOPHAGOGASTRODUODENOSCOPY TRANSORAL DIAGNOSTIC N/A 01/29/2019    Procedure: EGD (ESOPHAGOGASTRODUODENOSCOPY);  Surgeon: Maryann Alar, MD;  Location: [place];  Service: GI    PR LAP,DIAGNOSTIC ABDOMEN N/A 02/03/2019    Procedure: LAPAROSCOPY, DIAGNOSTIC, EXTENSIVE LYSIS OF ADHESIONS;  Surgeon: Thompsoncarrillo, Raquel Sarna, MD;  Location: [place] [facility];  Service: OBGYN       Family History   Problem Relation Age of Onset    Colon cancer Paternal Grandmother     Breast cancer Maternal Grandmother 24       Social History      reports that she has never smoked. She has never used smokeless tobacco. She reports previous alcohol use.  She reports being sexually active and has had partner(s) who are Female. She reports using the following method of birth control/protection: None. She reports that she does not use drugs.    Living Situation     Questions Responses    Patient lives with [facility]     Caregiver for other family member     External Services     Employment Employed    Domestic Violence Risk           Review of Systems   Review of Systems   Constitutional: Positive for fever.   HENT: Negative.    Eyes: Negative.    Respiratory: Negative.    Cardiovascular: Negative.    Gastrointestinal: Positive for abdominal pain (RLQ), nausea and vomiting.   Endocrine: Negative.    Genitourinary: Positive for vaginal bleeding (x 1 day). Negative for vaginal discharge.   Musculoskeletal: Negative.    Allergic/Immunologic: Negative.    Neurological: Negative.    Hematological: Negative.    Psychiatric/Behavioral: Negative.        Physical Exam   BP 118/82 (BP Location: Right arm)    Pulse 85    Temp 35.7  C (96.3 F) (Temporal)    Resp 18    Ht 1.549 m ('5\' 1"'$ )    Wt 92.1 kg (203 lb)    SpO2 99%    BMI 38.36 kg/m     Physical Exam  Vitals signs and nursing note reviewed.   Constitutional:       Appearance: She is well-developed.   HENT:      Head: Normocephalic and atraumatic.   Eyes:      Extraocular Movements: Extraocular movements intact.      Pupils: Pupils are equal, round, and reactive to light.   Cardiovascular:      Rate and Rhythm: Normal rate and regular rhythm.      Heart sounds: Normal heart sounds.   Pulmonary:      Effort: Pulmonary effort is normal.      Breath sounds: Normal breath sounds.   Abdominal:      General: Bowel sounds are normal. There is no distension.      Palpations: Abdomen is soft. There is no mass.      Tenderness: There is abdominal tenderness in the right lower quadrant. There is guarding. There is no rebound.   Genitourinary:     Comments: ED provider unable to complete GU exam due to pt's c/o pain during exam.    Deferred exam to OB/GYN  No CVA tenderness   Skin:     General: Skin is warm and dry.   Neurological:      General: No focal deficit present.      Mental Status: She is alert and oriented to person, place, and time.   Psychiatric:         Mood and Affect: Mood normal.         Behavior: Behavior normal.         Tests   Labs:   All labs in the last 24 hours:   Recent Results (from the past 24 hour(s))   CBC and differential    Collection Time: 08/25/19  8:29 PM   Result Value Ref Range    WBC 8.9 4.0 - 10.0 THOU/uL    RBC 4.0 3.9 - 5.2 MIL/uL    Hemoglobin 9.9 (L) 11.2 - 15.7 g/dL    Hematocrit 33 (L) 34 - 45 %    MCV 83 79 - 95 fL    MCH 25 (L) 26 - 32 pg/cell    MCHC 30 (L) 32 - 36 g/dL    RDW 16.5 (H) 11.7 - 14.4 %    Platelets 559 (H) 160 - 370 THOU/uL    Seg Neut % 72.2 %    Lymphocyte % 21.5 %    Monocyte % 4.9 %    Eosinophil % 0.8 %    Basophil % 0.3 %    Neut # K/uL 6.4 (H) 1.6 - 6.1 THOU/uL    Lymph # K/uL 1.9 1.2 - 3.7 THOU/uL    Mono # K/uL 0.4 0.2 - 0.9 THOU/uL    Eos # K/uL 0.1 0.0 - 0.4 THOU/uL    Baso # K/uL 0.0 0.0 - 0.1 THOU/uL    Nucl RBC % 0.0 0.0 - 0.2 /100 WBC    Nucl RBC # K/uL 0.0 0.0 - 0.0 THOU/uL    IMM Granulocytes # 0.0 0.0 - 0.0 THOU/uL    IMM Granulocytes 0.3 %   Plasma profile 7 (Adult ED only)    Collection Time: 08/25/19  8:29 PM   Result Value Ref Range  Chloride,Plasma 103 96 - 108 mmol/L    CO2,Plasma 20 20 - 28 mmol/L    Potassium,Plasma CANCELED 3.4 - 4.7 mmol/L    Sodium,Plasma 135 133 - 145 mmol/L    Anion Gap,PL 12 7 - 16    UN,Plasma 10 6 - 20 mg/dL    Creatinine 0.61 0.51 - 0.95 mg/dL    GFR,Caucasian 119 *    GFR,Black 137 *    Glucose,Plasma 91 60 - 99 mg/dL   RUQ panel (ED only)    Collection Time: 08/25/19  8:29 PM   Result Value Ref Range    Amylase 43 28 - 100 U/L    Lipase 23 13 - 60 U/L    Total Protein 8.1 (H) 6.3 - 7.7 g/dL    Albumin 4.4 3.5 - 5.2 g/dL    Bilirubin,Total <0.2 0.0 - 1.2 mg/dL    Bilirubin,Direct <0.2 0.0 - 0.3 mg/dL    Alk Phos CANCELED 35 - 105  U/L    AST CANCELED 0 - 35 U/L    ALT CANCELED 0 - 35 U/L   Pregnancy Test, Serum    Collection Time: 08/25/19  8:29 PM   Result Value Ref Range    Preg,Serum NEG NEGATIVE   Hold blue    Collection Time: 08/25/19  8:29 PM   Result Value Ref Range    Hold Blue HOLD TUBE    Urinalysis reflex to culture    Collection Time: 08/25/19 10:59 PM   Result Value Ref Range    Color, UA Yellow Yellow-Dk Yellow    Appearance,UR Clear Clear    Specific Gravity,UA >=1.045 (!) 1.002 - 1.030    Leuk Esterase,UA NEG NEGATIVE    Nitrite,UA NEG NEGATIVE    pH,UA 6.5 5.0 - 8.0    Protein,UA 1+ (!) NEGATIVE mg/dL    Glucose,UA NEG NEGATIVE mg/dL    Ketones, UA Trace (!) NEGATIVE    Blood,UA 2+ (!) NEGATIVE   Urine microscopic (iq200)    Collection Time: 08/25/19 10:59 PM   Result Value Ref Range    RBC,UA 0-3 0 - 3 /hpf    WBC,UA 0-5 0 - 5 /hpf    Bacteria,UA 2+ (!) NEGATIVE    Hyaline Casts,UA 0-5 0 - 5 /lpf    Squam Epithel,UA 4+ (!) 0-1+    Trans Epithel,UA 1+ 0-1+        Imaging:   US pelvic complete/US transvaginal: Somewhat limited exam as patient was unable to tolerate transvaginal evaluation. Sonographically normal-appearing ovaries on the transabdominal ultrasound. Both arterial and venous flow are seen in the right ovary in this patient with right lower quadrant pain. Venous flow is not seen in the left ovary which I suspect is due to its smaller size and transabdominal technique. This does not absolutely rule out left ovarian torsion which is a clinical diagnosis, but this is thought to be very unlikely.     CT A/P done at [facility] 08/25/19:   1. No evidence of acute appendicitis.  2. Stable appearance of the uterus with loss of fat plane between the uterus and the anterior abdominal wall. Findings may be secondary to scarring versus endometrial implants along an area of scarring. As before, MRI may be obtained for additional  evaluation as clinically indicated.            Medical Decision Making      Amount and/or Complexity of  Data Reviewed  Clinical lab tests: ordered and reviewed  Tests in the  radiology section of CPT: reviewed  Tests in the medicine section of CPT: reviewed  Review and summarize past medical records: yes  Discuss the patient with other providers: yes (OB/GYN consult )  Independent visualization of images, tracings, or specimens: yes        Assessment:    33 y.o., female with a PMH of left TOA, and PID placed in OBS after evaluation in the ED for abdominal pain.     Differential Diagnosis includes PID, TOA and possible ovarian torsion.                     Plan:   1) Abdominal pain:  -CT A/P at [facility] showed no acute findings done 08/25/19  -US pelvic complete/US transvaginal: Normal appearing ovaries. Venous flow not seen in L ovary   -Received NS 1000 cc bolus and Morphine 4 mg IV x 1 in the ED  -Cefoxitin 2 g IV x 1 and Doxycycline 100 mg po x 1 ordered in the ED for possible   -OB/GYN consulted-pending recommendations,   -Daily labs     2) Iron deficiency:  -Hct stable 33, baseline low 30s  -Check iron studies   -Continue home Iron     Medically preferred DVT prophylaxis: Ambulate   Smoking Cessation: N/A  Code Status: Full code   Disposition Barriers: No barriers   Covid-19 Status: Pending     Paticia Stack, NP     Paticia Stack, NP  08/26/19 0600       Paticia Stack, NP  08/26/19 [phone]

## 2019-08-26 NOTE — Progress Notes (Signed)
08/26/19 0800   UM Patient Class Review   Patient Class Review Observation   Patient Class Effective 08/25/2019    Elder Negus RN  Utilization Management  Pager [phone]  Phone [phone]

## 2019-08-26 NOTE — ED Notes (Signed)
Plan of Care      VS q4   Assessments   Pain management   LR @100    Meds per [facility]   Comfort measures

## 2019-08-26 NOTE — ED Notes (Signed)
Patient assessed. Patient A&Ox4 and currently reports 1/10 RLQ abdominal pain described as constant discomfort. Patient currently denies nausea, SOB, dizziness, weakness, numbness or tingling. Patient also experiencing abdominal tenderness. Patient on LR @ 100, POC reviewed, call bell within reach, bed in lowest position, will continue to treat and monitor.

## 2019-08-26 NOTE — ED Notes (Signed)
Plan of Care     Reviewed with pt and includes:   Pain management   Antiemetics   IVF's   VS every 4 hours   Maintain safety and comfort   Serial abdominal exams   OBS provider following

## 2019-08-28 ENCOUNTER — Emergency Department
Admission: EM | Admit: 2019-08-28 | Discharge: 2019-08-28 | Discharge status: Against Medical Advice | Payer: Medicaid Other | Source: Ambulatory Visit | Attending: Emergency Medicine | Admitting: Emergency Medicine

## 2019-08-28 MED ORDER — DEXTROSE 5 % FLUSH FOR PUMPS *I*
0.0000 mL/h | INTRAVENOUS | Status: DC | PRN
Start: 2019-08-28 — End: 2019-08-29

## 2019-08-28 MED ORDER — SODIUM CHLORIDE 0.9 % FLUSH FOR PUMPS *I*
0.0000 mL/h | INTRAVENOUS | Status: DC | PRN
Start: 2019-08-28 — End: 2019-08-29

## 2019-08-28 NOTE — ED Notes (Signed)
WAITING ROOM CHECK    Patient visualized in waiting room. Patient appears comfortable. Patient's chart reviewed and most recent vital signs reviewed. Will continue to monitor.

## 2019-08-28 NOTE — ED Triage Notes (Signed)
Coming in c/o hematemesis (4 episodes today). Hx GI bleed, anemia        Triage Note   Willeen Cass, RN

## 2019-08-29 LAB — UNMAPPED LAB RESULTS
ABO RH Blood Type (HT): A POS — NL
Antibody Screen (HT): NEGATIVE — NL
Basophil # (HT): 0 10 3/uL — NL (ref 0.0–0.2)
Basophil # (HT): 0 10 3/uL — NL (ref 0.0–0.2)
Basophil % (HT): 0 % — NL (ref 0–3)
Basophil % (HT): 0 % — NL (ref 0–3)
Eosinophil # (HT): 0.1 10 3/uL — NL (ref 0.0–0.6)
Eosinophil # (HT): 0.1 10 3/uL — NL (ref 0.0–0.6)
Eosinophil % (HT): 1 % — NL (ref 0–5)
Eosinophil % (HT): 1 % — NL (ref 0–5)
Hematocrit (HT): 31 % — ABNORMAL LOW (ref 35–47)
Hematocrit (HT): 30 % — ABNORMAL LOW (ref 35–47)
Hemoglobin (HGB) (HT): 9.2 g/dL — ABNORMAL LOW (ref 12.0–16.0)
Hemoglobin (HGB) (HT): 9.2 g/dL — ABNORMAL LOW (ref 12.0–16.0)
Lymphocyte # (HT): 1.4 10 3/uL — NL (ref 1.0–4.8)
Lymphocyte # (HT): 2 10 3/uL — NL (ref 1.0–4.8)
Lymphocyte % (HT): 19 % — NL (ref 15–45)
Lymphocyte % (HT): 28 % — NL (ref 15–45)
MCHC (HT): 29.6 g/dL — ABNORMAL LOW (ref 31.0–37.5)
MCHC (HT): 30.6 g/dL — ABNORMAL LOW (ref 31.0–37.5)
MCV (HT): 80 fL — NL (ref 80–100)
MCV (HT): 81 fL — NL (ref 80–100)
Mean Corpuscular Hemoglobin (MCH) (HT): 23.6 pg — ABNORMAL LOW (ref 26.0–34.0)
Mean Corpuscular Hemoglobin (MCH) (HT): 24.9 pg — ABNORMAL LOW (ref 26.0–34.0)
Monocyte # (HT): 0.3 10 3/uL — NL (ref 0.1–1.0)
Monocyte # (HT): 0.4 10 3/uL — NL (ref 0.1–1.0)
Monocyte % (HT): 4 % — NL (ref 0–15)
Monocyte % (HT): 6 % — NL (ref 0–15)
Neutrophil # (HT): 5.4 10 3/uL — NL (ref 1.8–8.0)
Neutrophil # (HT): 4.5 10 3/uL — NL (ref 1.8–8.0)
Platelets (HT): 478 10 3/uL — ABNORMAL HIGH (ref 150–450)
Platelets (HT): 432 10 3/uL — NL (ref 150–450)
RBC (HT): 3.9 10 6/uL — NL (ref 3.80–5.20)
RBC (HT): 3.7 10 6/uL — ABNORMAL LOW (ref 3.80–5.20)
RDW (HT): 15.9 % — ABNORMAL HIGH (ref 0.0–15.2)
RDW (HT): 15.9 % — ABNORMAL HIGH (ref 0.0–15.2)
Seg Neut % (HT): 76 % — ABNORMAL HIGH (ref 45–75)
Seg Neut % (HT): 64 % — NL (ref 45–75)
WBC (HT): 7.1 10 3/uL — NL (ref 4.0–11.0)
WBC (HT): 6.9 10 3/uL — NL (ref 4.0–11.0)

## 2019-08-29 LAB — GC CULTURE: GC Culture: 0

## 2019-08-30 LAB — UNMAPPED LAB RESULTS
Basophil # (HT): 0 10 3/uL — NL (ref 0.0–0.2)
Basophil # (HT): 0 10 3/uL — NL (ref 0.0–0.2)
Basophil % (HT): 1 % — NL (ref 0–3)
Basophil % (HT): 1 % — NL (ref 0–3)
Eosinophil # (HT): 0.1 10 3/uL — NL (ref 0.0–0.6)
Eosinophil # (HT): 0.1 10 3/uL — NL (ref 0.0–0.6)
Eosinophil % (HT): 2 % — NL (ref 0–5)
Eosinophil % (HT): 2 % — NL (ref 0–5)
Hematocrit (HT): 28 % — ABNORMAL LOW (ref 35–47)
Hematocrit (HT): 31 % — ABNORMAL LOW (ref 35–47)
Hemoglobin (HGB) (HT): 8.3 g/dL — ABNORMAL LOW (ref 12.0–16.0)
Hemoglobin (HGB) (HT): 9.1 g/dL — ABNORMAL LOW (ref 12.0–16.0)
Lymphocyte # (HT): 1.9 10 3/uL — NL (ref 1.0–4.8)
Lymphocyte # (HT): 1 10 3/uL — NL (ref 1.0–4.8)
Lymphocyte % (HT): 30 % — NL (ref 15–45)
Lymphocyte % (HT): 17 % — NL (ref 15–45)
MCHC (HT): 29.9 g/dL — ABNORMAL LOW (ref 31.0–37.5)
MCHC (HT): 29.7 g/dL — ABNORMAL LOW (ref 31.0–37.5)
MCV (HT): 82 fL — NL (ref 80–100)
MCV (HT): 80 fL — NL (ref 80–100)
Mean Corpuscular Hemoglobin (MCH) (HT): 24.3 pg — ABNORMAL LOW (ref 26.0–34.0)
Mean Corpuscular Hemoglobin (MCH) (HT): 23.8 pg — ABNORMAL LOW (ref 26.0–34.0)
Monocyte # (HT): 0.4 10 3/uL — NL (ref 0.1–1.0)
Monocyte # (HT): 0.3 10 3/uL — NL (ref 0.1–1.0)
Monocyte % (HT): 6 % — NL (ref 0–15)
Monocyte % (HT): 5 % — NL (ref 0–15)
Neutrophil # (HT): 3.8 10 3/uL — NL (ref 1.8–8.0)
Neutrophil # (HT): 4.2 10 3/uL — NL (ref 1.8–8.0)
Platelets (HT): 395 10 3/uL — NL (ref 150–450)
Platelets (HT): 442 10 3/uL — NL (ref 150–450)
RBC (HT): 3.41 10 6/uL — ABNORMAL LOW (ref 3.80–5.20)
RBC (HT): 3.83 10 6/uL — NL (ref 3.80–5.20)
RDW (HT): 16.1 % — ABNORMAL HIGH (ref 0.0–15.2)
RDW (HT): 16.1 % — ABNORMAL HIGH (ref 0.0–15.2)
Seg Neut % (HT): 61 % — NL (ref 45–75)
Seg Neut % (HT): 75 % — NL (ref 45–75)
WBC (HT): 6.2 10 3/uL — NL (ref 4.0–11.0)
WBC (HT): 5.6 10 3/uL — NL (ref 4.0–11.0)

## 2019-08-31 LAB — UNMAPPED LAB RESULTS
Basophil # (HT): 0 10 3/uL — NL (ref 0.0–0.2)
Basophil % (HT): 1 % — NL (ref 0–3)
Eosinophil # (HT): 0.1 10 3/uL — NL (ref 0.0–0.6)
Eosinophil % (HT): 1 % — NL (ref 0–5)
Hematocrit (HT): 29 % — ABNORMAL LOW (ref 35–47)
Hemoglobin (HGB) (HT): 8.6 g/dL — ABNORMAL LOW (ref 12.0–16.0)
Lymphocyte # (HT): 1.5 10 3/uL — NL (ref 1.0–4.8)
Lymphocyte % (HT): 27 % — NL (ref 15–45)
MCHC (HT): 29.4 g/dL — ABNORMAL LOW (ref 31.0–37.5)
MCV (HT): 81 fL — NL (ref 80–100)
Mean Corpuscular Hemoglobin (MCH) (HT): 23.8 pg — ABNORMAL LOW (ref 26.0–34.0)
Monocyte # (HT): 0.4 10 3/uL — NL (ref 0.1–1.0)
Monocyte % (HT): 7 % — NL (ref 0–15)
Neutrophil # (HT): 3.5 10 3/uL — NL (ref 1.8–8.0)
Platelets (HT): 433 10 3/uL — NL (ref 150–450)
RBC (HT): 3.61 10 6/uL — ABNORMAL LOW (ref 3.80–5.20)
RDW (HT): 16.1 % — ABNORMAL HIGH (ref 0.0–15.2)
Seg Neut % (HT): 63 % — NL (ref 45–75)
WBC (HT): 5.5 10 3/uL — NL (ref 4.0–11.0)

## 2019-09-01 LAB — CHLAMYDIA PLASMID DNA AMPLIFICATION

## 2019-09-04 LAB — UNMAPPED LAB RESULTS
Basophil # (HT): 0 10 3/uL — NL (ref 0.0–0.2)
Basophil % (HT): 0 % — NL (ref 0–3)
Eosinophil # (HT): 0.2 10 3/uL — NL (ref 0.0–0.6)
Eosinophil % (HT): 3 % — NL (ref 0–5)
Hematocrit (HT): 34 % — ABNORMAL LOW (ref 35–47)
Hemoglobin (HGB) (HT): 10.1 g/dL — ABNORMAL LOW (ref 12.0–16.0)
Lymphocyte # (HT): 1.2 10 3/uL — NL (ref 1.0–4.8)
Lymphocyte % (HT): 14 % — ABNORMAL LOW (ref 15–45)
MCHC (HT): 30.1 g/dL — ABNORMAL LOW (ref 31.0–37.5)
MCV (HT): 81 fL — NL (ref 80–100)
Mean Corpuscular Hemoglobin (MCH) (HT): 24.5 pg — ABNORMAL LOW (ref 26.0–34.0)
Monocyte # (HT): 0.4 10 3/uL — NL (ref 0.1–1.0)
Monocyte % (HT): 5 % — NL (ref 0–15)
Neutrophil # (HT): 6.6 10 3/uL — NL (ref 1.8–8.0)
Platelets (HT): 458 10 3/uL — ABNORMAL HIGH (ref 150–450)
RBC (HT): 4.12 10 6/uL — NL (ref 3.80–5.20)
RDW (HT): 16 % — ABNORMAL HIGH (ref 0.0–15.2)
Seg Neut % (HT): 78 % — ABNORMAL HIGH (ref 45–75)
WBC (HT): 8.5 10 3/uL — NL (ref 4.0–11.0)

## 2019-09-07 LAB — UNMAPPED LAB RESULTS
Basophil # (HT): 0.1 10 3/uL — NL (ref 0.0–0.2)
Basophil % (HT): 1 % — NL (ref 0–3)
Eosinophil # (HT): 0.2 10 3/uL — NL (ref 0.0–0.6)
Eosinophil % (HT): 2 % — NL (ref 0–5)
Hematocrit (HT): 32 % — ABNORMAL LOW (ref 35–47)
Hemoglobin (HGB) (HT): 9.6 g/dL — ABNORMAL LOW (ref 12.0–16.0)
Lymphocyte # (HT): 1.9 10 3/uL — NL (ref 1.0–4.8)
Lymphocyte % (HT): 21 % — NL (ref 15–45)
MCHC (HT): 30.2 g/dL — ABNORMAL LOW (ref 31.0–37.5)
MCV (HT): 80 fL — NL (ref 80–100)
Mean Corpuscular Hemoglobin (MCH) (HT): 24.2 pg — ABNORMAL LOW (ref 26.0–34.0)
Monocyte # (HT): 0.5 10 3/uL — NL (ref 0.1–1.0)
Monocyte % (HT): 5 % — NL (ref 0–15)
Neutrophil # (HT): 6.1 10 3/uL — NL (ref 1.8–8.0)
Platelets (HT): 454 10 3/uL — ABNORMAL HIGH (ref 150–450)
RBC (HT): 3.96 10 6/uL — NL (ref 3.80–5.20)
RDW (HT): 15.9 % — ABNORMAL HIGH (ref 0.0–15.2)
Seg Neut % (HT): 70 % — NL (ref 45–75)
WBC (HT): 8.7 10 3/uL — NL (ref 4.0–11.0)

## 2019-09-08 LAB — UNMAPPED LAB RESULTS
Basophil # (HT): 0.1 10 3/uL — NL (ref 0.0–0.2)
Basophil % (HT): 1 % — NL (ref 0–3)
Eosinophil # (HT): 0.1 10 3/uL — NL (ref 0.0–0.6)
Eosinophil % (HT): 2 % — NL (ref 0–5)
Hematocrit (HT): 29 % — ABNORMAL LOW (ref 35–47)
Hemoglobin (HGB) (HT): 8.5 g/dL — ABNORMAL LOW (ref 12.0–16.0)
Lymphocyte # (HT): 1.9 10 3/uL — NL (ref 1.0–4.8)
Lymphocyte % (HT): 24 % — NL (ref 15–45)
MCHC (HT): 29.5 g/dL — ABNORMAL LOW (ref 31.0–37.5)
MCV (HT): 82 fL — NL (ref 80–100)
Mean Corpuscular Hemoglobin (MCH) (HT): 24.1 pg — ABNORMAL LOW (ref 26.0–34.0)
Monocyte # (HT): 0.4 10 3/uL — NL (ref 0.1–1.0)
Monocyte % (HT): 5 % — NL (ref 0–15)
Neutrophil # (HT): 5.4 10 3/uL — NL (ref 1.8–8.0)
RBC (HT): 3.52 10 6/uL — ABNORMAL LOW (ref 3.80–5.20)
RDW (HT): 15.9 % — ABNORMAL HIGH (ref 0.0–15.2)
Seg Neut % (HT): 68 % — NL (ref 45–75)
WBC (HT): 7.9 10 3/uL — NL (ref 4.0–11.0)

## 2019-09-09 LAB — UNMAPPED LAB RESULTS
Basophil # (HT): 0 10 3/uL — NL (ref 0.0–0.2)
Basophil % (HT): 1 % — NL (ref 0–3)
Eosinophil # (HT): 0.2 10 3/uL — NL (ref 0.0–0.6)
Eosinophil % (HT): 2 % — NL (ref 0–5)
Hematocrit (HT): 29 % — ABNORMAL LOW (ref 35–47)
Hemoglobin (HGB) (HT): 8.6 g/dL — ABNORMAL LOW (ref 12.0–16.0)
Lymphocyte # (HT): 1.9 10 3/uL — NL (ref 1.0–4.8)
Lymphocyte % (HT): 24 % — NL (ref 15–45)
MCHC (HT): 29.7 g/dL — ABNORMAL LOW (ref 31.0–37.5)
MCV (HT): 79 fL — ABNORMAL LOW (ref 80–100)
Mean Corpuscular Hemoglobin (MCH) (HT): 23.5 pg — ABNORMAL LOW (ref 26.0–34.0)
Monocyte # (HT): 0.5 10 3/uL — NL (ref 0.1–1.0)
Monocyte % (HT): 6 % — NL (ref 0–15)
Neutrophil # (HT): 5.3 10 3/uL — NL (ref 1.8–8.0)
Platelets (HT): 423 10 3/uL — NL (ref 150–450)
RBC (HT): 3.66 10 6/uL — ABNORMAL LOW (ref 3.80–5.20)
RDW (HT): 15.7 % — ABNORMAL HIGH (ref 0.0–15.2)
Seg Neut % (HT): 67 % — NL (ref 45–75)
WBC (HT): 8 10 3/uL — NL (ref 4.0–11.0)

## 2019-09-30 LAB — UNMAPPED LAB RESULTS
Basophil # (HT): 0 10 3/uL — NL (ref 0.0–0.2)
Basophil % (HT): 0 % — NL (ref 0–3)
Eosinophil # (HT): 0 10 3/uL — NL (ref 0.0–0.6)
Eosinophil % (HT): 0 % — NL (ref 0–5)
Hematocrit (HT): 34 % — ABNORMAL LOW (ref 35–47)
Hemoglobin (HGB) (HT): 10.3 g/dL — ABNORMAL LOW (ref 12.0–16.0)
Lymphocyte # (HT): 1.3 10 3/uL — NL (ref 1.0–4.8)
Lymphocyte % (HT): 17 % — NL (ref 15–45)
MCHC (HT): 30.3 g/dL — ABNORMAL LOW (ref 31.0–37.5)
MCV (HT): 77 fL — ABNORMAL LOW (ref 80–100)
Mean Corpuscular Hemoglobin (MCH) (HT): 23.5 pg — ABNORMAL LOW (ref 26.0–34.0)
Monocyte # (HT): 0.5 10 3/uL — NL (ref 0.1–1.0)
Monocyte % (HT): 6 % — NL (ref 0–15)
Neutrophil # (HT): 5.9 10 3/uL — NL (ref 1.8–8.0)
Platelets (HT): 519 10 3/uL — ABNORMAL HIGH (ref 150–450)
RBC (HT): 4.39 10 6/uL — NL (ref 3.80–5.20)
RDW (HT): 15.9 % — ABNORMAL HIGH (ref 0.0–15.2)
Seg Neut % (HT): 75 % — NL (ref 45–75)
WBC (HT): 7.8 10 3/uL — NL (ref 4.0–11.0)

## 2019-10-22 LAB — UNMAPPED LAB RESULTS
ABO RH Blood Type (HT): A POS — NL
Antibody Screen (HT): NEGATIVE — NL
Hematocrit (HT): 33 % — ABNORMAL LOW (ref 35–47)
Hemoglobin (HGB) (HT): 9.5 g/dL — ABNORMAL LOW (ref 12.0–16.0)

## 2019-10-27 LAB — UNMAPPED LAB RESULTS
ABO RH Blood Type (HT): A POS — NL
Antibody Screen (HT): NEGATIVE — NL
Basophil # (HT): 0 10 3/uL — NL (ref 0.0–0.2)
Basophil % (HT): 0 % — NL (ref 0–3)
Eosinophil # (HT): 0.1 10 3/uL — NL (ref 0.0–0.6)
Eosinophil % (HT): 1 % — NL (ref 0–5)
Hematocrit (HT): 35 % — NL (ref 35–47)
Hemoglobin (HGB) (HT): 10.5 g/dL — ABNORMAL LOW (ref 12.0–16.0)
Lymphocyte # (HT): 2.3 10 3/uL — NL (ref 1.0–4.8)
Lymphocyte % (HT): 21 % — NL (ref 15–45)
MCHC (HT): 29.7 g/dL — ABNORMAL LOW (ref 31.0–37.5)
MCV (HT): 77 fL — ABNORMAL LOW (ref 80–100)
Mean Corpuscular Hemoglobin (MCH) (HT): 22.7 pg — ABNORMAL LOW (ref 26.0–34.0)
Monocyte # (HT): 0.4 10 3/uL — NL (ref 0.1–1.0)
Monocyte % (HT): 4 % — NL (ref 0–15)
Neutrophil # (HT): 7.9 10 3/uL — NL (ref 1.8–8.0)
Platelets (HT): 596 10 3/uL — ABNORMAL HIGH (ref 150–450)
RBC (HT): 4.62 10 6/uL — NL (ref 3.80–5.20)
RDW (HT): 17.2 % — ABNORMAL HIGH (ref 0.0–15.2)
Seg Neut % (HT): 72 % — NL (ref 45–75)
WBC (HT): 10.9 10 3/uL — NL (ref 4.0–11.0)

## 2019-11-05 DIAGNOSIS — R109 Unspecified abdominal pain: Secondary | ICD-10-CM

## 2019-11-05 DIAGNOSIS — R102 Pelvic and perineal pain: Secondary | ICD-10-CM | POA: Insufficient documentation

## 2019-11-05 NOTE — ED Triage Notes (Signed)
Patient reports she has been having RLQ for a month .She is positive for emesis, chills.        Triage Note   Neal Dy, RN

## 2019-11-06 ENCOUNTER — Encounter: Payer: Self-pay | Admitting: Emergency Medicine

## 2019-11-06 ENCOUNTER — Emergency Department
Admission: EM | Admit: 2019-11-06 | Discharge: 2019-11-06 | Disposition: A | Discharge status: Home / Self Care | Payer: Medicaid Other | Source: Ambulatory Visit | Attending: Emergency Medicine | Admitting: Emergency Medicine

## 2019-11-06 DIAGNOSIS — R109 Unspecified abdominal pain: Secondary | ICD-10-CM

## 2019-11-06 LAB — POCT URINALYSIS DIPSTICK
Glucose,UA POCT: NORMAL mg/dL
Ketones,UA POCT: NEGATIVE mg/dL
Lot #: 41518302
Nitrite,UA POCT: NEGATIVE
PH,UA POCT: 5 (ref 5–8)

## 2019-11-06 LAB — UNMAPPED LAB RESULTS
Basophil # (HT): 0 10 3/uL — NL (ref 0.0–0.2)
Basophil % (HT): 0 % — NL (ref 0–3)
Eosinophil # (HT): 0.2 10 3/uL — NL (ref 0.0–0.6)
Eosinophil % (HT): 2 % — NL (ref 0–5)
Hematocrit (HT): 34 % — ABNORMAL LOW (ref 35–47)
Hemoglobin (HGB) (HT): 10.3 g/dL — ABNORMAL LOW (ref 12.0–16.0)
Lymphocyte # (HT): 1.7 10 3/uL — NL (ref 1.0–4.8)
Lymphocyte % (HT): 19 % — NL (ref 15–45)
MCHC (HT): 30.6 g/dL — ABNORMAL LOW (ref 31.0–37.5)
MCV (HT): 76 fL — ABNORMAL LOW (ref 80–100)
Mean Corpuscular Hemoglobin (MCH) (HT): 23.2 pg — ABNORMAL LOW (ref 26.0–34.0)
Monocyte # (HT): 0.4 10 3/uL — NL (ref 0.1–1.0)
Monocyte % (HT): 4 % — NL (ref 0–15)
Neutrophil # (HT): 7 10 3/uL — NL (ref 1.8–8.0)
Platelets (HT): 598 10 3/uL — ABNORMAL HIGH (ref 150–450)
RBC (HT): 4.44 10 6/uL — NL (ref 3.80–5.20)
RDW (HT): 17.9 % — ABNORMAL HIGH (ref 0.0–15.2)
Seg Neut % (HT): 75 % — NL (ref 45–75)
WBC (HT): 9.4 10 3/uL — NL (ref 4.0–11.0)

## 2019-11-06 LAB — CBC AND DIFFERENTIAL
Baso # K/uL: 0.1 10*3/uL (ref 0.0–0.1)
Basophil %: 0.5 %
Eos # K/uL: 0.2 10*3/uL (ref 0.0–0.4)
Eosinophil %: 2 %
Hematocrit: 35 % (ref 34–45)
Hemoglobin: 10.3 g/dL — ABNORMAL LOW (ref 11.2–15.7)
IMM Granulocytes #: 0 10*3/uL (ref 0.0–0.0)
IMM Granulocytes: 0.3 %
Lymph # K/uL: 2.5 10*3/uL (ref 1.2–3.7)
Lymphocyte %: 25.5 %
MCH: 23 pg — ABNORMAL LOW (ref 26–32)
MCHC: 29 g/dL — ABNORMAL LOW (ref 32–36)
MCV: 77 fL — ABNORMAL LOW (ref 79–95)
Mono # K/uL: 0.6 10*3/uL (ref 0.2–0.9)
Monocyte %: 6.4 %
Neut # K/uL: 6.3 10*3/uL — ABNORMAL HIGH (ref 1.6–6.1)
Nucl RBC # K/uL: 0 10*3/uL (ref 0.0–0.0)
Nucl RBC %: 0 /100 WBC (ref 0.0–0.2)
Platelets: 641 10*3/uL — ABNORMAL HIGH (ref 160–370)
RBC: 4.6 MIL/uL (ref 3.9–5.2)
RDW: 17.9 % — ABNORMAL HIGH (ref 11.7–14.4)
Seg Neut %: 65.3 %
WBC: 9.7 10*3/uL (ref 4.0–10.0)

## 2019-11-06 LAB — POCT URINE PREGNANCY: Preg Test,UR POC: NEGATIVE

## 2019-11-06 LAB — BASIC METABOLIC PANEL
Anion Gap: 14 (ref 7–16)
CO2: 21 mmol/L (ref 20–28)
Calcium: 9.9 mg/dL (ref 8.8–10.2)
Chloride: 107 mmol/L (ref 96–108)
Creatinine: 0.92 mg/dL (ref 0.51–0.95)
GFR,Black: 94 *
GFR,Caucasian: 82 *
Glucose: 120 mg/dL — ABNORMAL HIGH (ref 60–99)
Lab: 9 mg/dL (ref 6–20)
Potassium: 4 mmol/L (ref 3.3–5.1)
Sodium: 142 mmol/L (ref 133–145)

## 2019-11-06 MED ORDER — OXYCODONE HCL 5 MG PO TABS *I*
5.0000 mg | ORAL_TABLET | Freq: Once | ORAL | Status: AC
Start: 2019-11-06 — End: 2019-11-06
  Administered 2019-11-06: 5 mg via ORAL
  Filled 2019-11-06: qty 1

## 2019-11-06 MED ORDER — HYDROXYZINE HCL 10 MG PO TABS *I*
10.0000 mg | ORAL_TABLET | Freq: Once | ORAL | Status: AC
Start: 2019-11-06 — End: 2019-11-06
  Administered 2019-11-06: 10 mg via ORAL
  Filled 2019-11-06: qty 1

## 2019-11-06 MED ORDER — DEXTROSE 5 % FLUSH FOR PUMPS *I*
0.0000 mL/h | INTRAVENOUS | Status: DC | PRN
Start: 2019-11-06 — End: 2019-11-06

## 2019-11-06 MED ORDER — ONDANSETRON HCL 2 MG/ML IV SOLN *I*
4.0000 mg | Freq: Once | INTRAMUSCULAR | Status: AC
Start: 2019-11-06 — End: 2019-11-06
  Administered 2019-11-06: 4 mg via INTRAVENOUS
  Filled 2019-11-06: qty 2

## 2019-11-06 MED ORDER — SODIUM CHLORIDE 0.9 % FLUSH FOR PUMPS *I*
0.0000 mL/h | INTRAVENOUS | Status: DC | PRN
Start: 2019-11-06 — End: 2019-11-06

## 2019-11-06 NOTE — Discharge Instructions (Signed)
You need to follow up with your primary care physician, please call within 48 hours to schedule an appointment to be seen in 3-5 days.

## 2019-11-06 NOTE — ED Notes (Signed)
Pt to ED with c/o RLQ abd pain that has been ongoing for 4 months. States the pain got worse today. Endorses vomiting with bright red blood for the past two hours and chills. Denies fevers, chest pain, SOB, bowel/urinary changes. Pt states she takes oxycodone at home for her pain. Will continue to treat per orders.

## 2019-11-06 NOTE — ED Provider Notes (Signed)
History     Chief Complaint   Patient presents with    Abdominal Pain     Per patient with several days of right-sided abdominal pain.  She has had similar pain in the past.  She is scheduled to have the hysterectomy and for actively on Monday at [facility].  She tells me her pain at home has not been controlled.  She has had nausea and vomiting.  She is moving her bowels normally.  She denies vaginal discharge or bleeding.      History provided by:  Patient      Medical/Surgical/Family History     Past Medical History:   Diagnosis Date    Anemia     Depression     GI bleed     hemetesis        Patient Active Problem List   Diagnosis Code    Anemia D64.9    Abdominal pain, unspecified abdominal location R10.9    TOA (tubo-ovarian abscess) N70.93    s/p laparoscopic LS with extensive LOA Z98.890    Abdominal pain R10.9    Pelvic inflammatory disease N73.9    Pelvic pain R10.2            Past Surgical History:   Procedure Laterality Date    CESAREAN SECTION, UNSPECIFIED      CHOLECYSTECTOMY      GALLBLADDER SURGERY      PR ESOPHAGOGASTRODUODENOSCOPY TRANSORAL DIAGNOSTIC N/A 01/29/2019    Procedure: EGD (ESOPHAGOGASTRODUODENOSCOPY);  Surgeon: Maryann Alar, MD;  Location: [place];  Service: GI    PR LAP,DIAGNOSTIC ABDOMEN N/A 02/03/2019    Procedure: LAPAROSCOPY, DIAGNOSTIC, EXTENSIVE LYSIS OF ADHESIONS;  Surgeon: Thompsoncarrillo, Raquel Sarna, MD;  Location: [place] [facility];  Service: OBGYN     Family History   Problem Relation Age of Onset    Colon cancer Paternal Grandmother     Breast cancer Maternal Grandmother 42    Diabetes Mother     Hypertension Mother           Social History     Tobacco Use    Smoking status: Never Smoker    Smokeless tobacco: Never Used   Substance Use Topics    Alcohol use: Not Currently     Frequency: Never    Drug use: Never     Living Situation     Questions Responses    Patient lives with Family    Homeless No    Caregiver for other family member Yes    External  Services None    Employment Employed    Domestic Violence Risk No                Review of Systems   Review of Systems   Constitutional: Negative for chills and fever.   HENT: Negative for congestion.    Eyes: Negative for visual disturbance.   Respiratory: Negative for shortness of breath.    Cardiovascular: Negative for chest pain.   Gastrointestinal: Negative for abdominal pain, constipation, diarrhea, nausea and vomiting.   Genitourinary: Positive for pelvic pain. Negative for dysuria.   Skin: Negative for color change.   Neurological: Negative for dizziness, light-headedness and headaches.   Psychiatric/Behavioral: Negative for confusion.       Physical Exam     Triage Vitals  Triage Start: Start, (11/05/19 2346)   First Recorded BP: 139/69, Resp: 20, Temp: 36.1 C (97 F), Temp src: TEMPORAL Oxygen Therapy SpO2: 100 %, O2 Device: None (Room air), Heart Rate: Marland Kitchen)  112, (11/05/19 2347) Heart Rate (via Pulse Ox): 75, (11/06/19 0314).  First Pain Reported  0-10 Scale: 9, Pain Location/Orientation: Abdomen, Pain Descriptors: Hervey Ard, (11/05/19 2347)       Physical Exam  Vitals signs and nursing note reviewed.   Constitutional:       Appearance: She is well-developed.   HENT:      Head: Atraumatic.   Eyes:      Extraocular Movements: Extraocular movements intact.   Cardiovascular:      Rate and Rhythm: Normal rate.   Abdominal:      Palpations: Abdomen is soft.      Tenderness: There is no abdominal tenderness. Negative signs include Murphy's sign and McBurney's sign.   Skin:     General: Skin is warm and dry.   Neurological:      Mental Status: She is alert and oriented to person, place, and time.   Psychiatric:         Behavior: Behavior normal.         Medical Decision Making   Patient seen by me on:  11/05/2019    Assessment:  Lasundra Pillars is a 33 y.o. female  With pelvic pain     Differential diagnosis:  Chronic abd pain   TOA  Torsion less likely   uti  Electrolyte disorder  Infection     Plan:  Labs: cbc,chem  7, ua    Meds: IV fluids, iv analgesia and antiemetics as needed.     Imaging: none  Dispo: Dispo pending clinical course in the ED.   Discussed with GYN, suspicion for torsion or tubo-ovarian abscess is low, patient is scheduled to have an oophorectomy in several days regardless.  Labs reassuring, patient's pain improved while in the emergency department.  We'll discharge home.      Independent review of: Existing labs, chart/prior records              [facility], [place], [place], DO  11/06/19 [phone]

## 2019-11-10 LAB — UNMAPPED LAB RESULTS
ABO RH Blood Type (HT): A POS — NL
Antibody Screen (HT): NEGATIVE — NL

## 2019-11-11 LAB — UNMAPPED LAB RESULTS
Hematocrit (HT): 26 % — ABNORMAL LOW (ref 35–47)
Hematocrit (HT): 25 % — ABNORMAL LOW (ref 35–47)
Hemoglobin (HGB) (HT): 7.7 g/dL — ABNORMAL LOW (ref 12.0–16.0)
Hemoglobin (HGB) (HT): 7.4 g/dL — ABNORMAL LOW (ref 12.0–16.0)

## 2019-11-12 LAB — UNMAPPED LAB RESULTS
Hematocrit (HT): 26 % — ABNORMAL LOW (ref 35–47)
Hematocrit (HT): 25 % — ABNORMAL LOW (ref 35–47)
Hematocrit (HT): 24 % — ABNORMAL LOW (ref 35–47)
Hemoglobin (HGB) (HT): 8 g/dL — ABNORMAL LOW (ref 12.0–16.0)
Hemoglobin (HGB) (HT): 7.6 g/dL — ABNORMAL LOW (ref 12.0–16.0)
Hemoglobin (HGB) (HT): 7.4 g/dL — ABNORMAL LOW (ref 12.0–16.0)
MCHC (HT): 30.5 g/dL — ABNORMAL LOW (ref 31.0–37.5)
MCV (HT): 80 fL — NL (ref 80–100)
Mean Corpuscular Hemoglobin (MCH) (HT): 24.3 pg — ABNORMAL LOW (ref 26.0–34.0)
Platelets (HT): 393 10 3/uL — NL (ref 150–450)
RBC (HT): 3.05 10 6/uL — ABNORMAL LOW (ref 3.80–5.20)
RDW (HT): 19.5 % — ABNORMAL HIGH (ref 0.0–15.2)
WBC (HT): 7.6 10 3/uL — NL (ref 4.0–11.0)

## 2019-11-13 LAB — UNMAPPED LAB RESULTS
Hematocrit (HT): 33 % — ABNORMAL LOW (ref 35–47)
Hemoglobin (HGB) (HT): 10 g/dL — ABNORMAL LOW (ref 12.0–16.0)

## 2019-11-25 LAB — UNMAPPED LAB RESULTS
Basophil # (HT): 0.1 10 3/uL — NL (ref 0.0–0.2)
Basophil % (HT): 1 % — NL (ref 0–3)
Eosinophil # (HT): 0.1 10 3/uL — NL (ref 0.0–0.6)
Eosinophil % (HT): 1 % — NL (ref 0–5)
Hematocrit (HT): 43 % — NL (ref 35–47)
Hemoglobin (HGB) (HT): 13.4 g/dL — NL (ref 12.0–16.0)
Lymphocyte # (HT): 1.8 10 3/uL — NL (ref 1.0–4.8)
Lymphocyte % (HT): 15 % — NL (ref 15–45)
MCHC (HT): 31 g/dL — NL (ref 31.0–37.5)
MCV (HT): 80 fL — NL (ref 80–100)
Mean Corpuscular Hemoglobin (MCH) (HT): 24.9 pg — ABNORMAL LOW (ref 26.0–34.0)
Monocyte # (HT): 0.4 10 3/uL — NL (ref 0.1–1.0)
Monocyte % (HT): 3 % — NL (ref 0–15)
Neutrophil # (HT): 9.6 10 3/uL — ABNORMAL HIGH (ref 1.8–8.0)
Platelets (HT): 780 10 3/uL — ABNORMAL HIGH (ref 150–450)
RBC (HT): 5.39 10 6/uL — ABNORMAL HIGH (ref 3.80–5.20)
RDW (HT): 18.5 % — ABNORMAL HIGH (ref 0.0–15.2)
Seg Neut % (HT): 80 % — ABNORMAL HIGH (ref 45–75)
WBC (HT): 12 10 3/uL — ABNORMAL HIGH (ref 4.0–11.0)

## 2020-01-25 LAB — UNMAPPED LAB RESULTS
Basophil # (HT): 0 10 3/uL — NL (ref 0.0–0.2)
Basophil # (HT): 0 10 3/uL — NL (ref 0.0–0.2)
Basophil % (HT): 1 % — NL (ref 0–3)
Basophil % (HT): 0 % — NL (ref 0–3)
Eosinophil # (HT): 0.1 10 3/uL — NL (ref 0.0–0.6)
Eosinophil # (HT): 0 10 3/uL — NL (ref 0.0–0.6)
Eosinophil % (HT): 2 % — NL (ref 0–5)
Eosinophil % (HT): 0 % — NL (ref 0–5)
Hematocrit (HT): 36 % — NL (ref 35–47)
Hematocrit (HT): 40 % — NL (ref 35–47)
Hemoglobin (HGB) (HT): 11.2 g/dL — ABNORMAL LOW (ref 12.0–16.0)
Hemoglobin (HGB) (HT): 12.9 g/dL — NL (ref 12.0–16.0)
Lymphocyte # (HT): 1.8 10 3/uL — NL (ref 1.0–4.8)
Lymphocyte # (HT): 1 10 3/uL — NL (ref 1.0–4.8)
Lymphocyte % (HT): 30 % — NL (ref 15–45)
Lymphocyte % (HT): 11 % — ABNORMAL LOW (ref 15–45)
MCHC (HT): 30.8 g/dL — ABNORMAL LOW (ref 31.0–37.5)
MCHC (HT): 32.1 g/dL — NL (ref 31.0–37.5)
MCV (HT): 85 fL — NL (ref 80–100)
MCV (HT): 83 fL — NL (ref 80–100)
Mean Corpuscular Hemoglobin (MCH) (HT): 26.1 pg — NL (ref 26.0–34.0)
Mean Corpuscular Hemoglobin (MCH) (HT): 26.5 pg — NL (ref 26.0–34.0)
Monocyte # (HT): 0.4 10 3/uL — NL (ref 0.1–1.0)
Monocyte # (HT): 0.3 10 3/uL — NL (ref 0.1–1.0)
Monocyte % (HT): 7 % — NL (ref 0–15)
Monocyte % (HT): 4 % — NL (ref 0–15)
Neutrophil # (HT): 3.6 10 3/uL — NL (ref 1.8–8.0)
Neutrophil # (HT): 7.6 10 3/uL — NL (ref 1.8–8.0)
Platelets (HT): 339 10 3/uL — NL (ref 150–450)
Platelets (HT): 418 10 3/uL — NL (ref 150–450)
RBC (HT): 4.29 10 6/uL — NL (ref 3.80–5.20)
RBC (HT): 4.86 10 6/uL — NL (ref 3.80–5.20)
RDW (HT): 15.4 % — ABNORMAL HIGH (ref 0.0–15.2)
RDW (HT): 15.3 % — ABNORMAL HIGH (ref 0.0–15.2)
Seg Neut % (HT): 61 % — NL (ref 45–75)
Seg Neut % (HT): 85 % — ABNORMAL HIGH (ref 45–75)
WBC (HT): 5.9 10 3/uL — NL (ref 4.0–11.0)
WBC (HT): 8.9 10 3/uL — NL (ref 4.0–11.0)

## 2020-02-12 LAB — UNMAPPED LAB RESULTS
Basophil # (HT): 0 10 3/uL — NL (ref 0.0–0.2)
Basophil % (HT): 0 % — NL (ref 0–3)
Eosinophil # (HT): 0 10 3/uL — NL (ref 0.0–0.6)
Eosinophil % (HT): 0 % — NL (ref 0–5)
Hematocrit (HT): 42 % — NL (ref 35–47)
Hemoglobin (HGB) (HT): 13.2 g/dL — NL (ref 12.0–16.0)
Lymphocyte # (HT): 0.8 10 3/uL — ABNORMAL LOW (ref 1.0–4.8)
Lymphocyte % (HT): 9 % — ABNORMAL LOW (ref 15–45)
MCHC (HT): 31.7 g/dL — NL (ref 31.0–37.5)
MCV (HT): 85 fL — NL (ref 80–100)
Mean Corpuscular Hemoglobin (MCH) (HT): 26.9 pg — NL (ref 26.0–34.0)
Monocyte # (HT): 0.4 10 3/uL — NL (ref 0.1–1.0)
Monocyte % (HT): 5 % — NL (ref 0–15)
Neutrophil # (HT): 7.7 10 3/uL — NL (ref 1.8–8.0)
Platelets (HT): 446 10 3/uL — NL (ref 150–450)
RBC (HT): 4.9 10 6/uL — NL (ref 3.80–5.20)
RDW (HT): 14.6 % — NL (ref 0.0–15.2)
Seg Neut % (HT): 85 % — ABNORMAL HIGH (ref 45–75)
WBC (HT): 9.1 10 3/uL — NL (ref 4.0–11.0)

## 2020-02-13 LAB — UNMAPPED LAB RESULTS
Hematocrit (HT): 38 % — NL (ref 35–47)
Hemoglobin (HGB) (HT): 12.2 g/dL — NL (ref 12.0–16.0)
MCHC (HT): 31.8 g/dL — NL (ref 31.0–37.5)
MCV (HT): 85 fL — NL (ref 80–100)
Mean Corpuscular Hemoglobin (MCH) (HT): 27.1 pg — NL (ref 26.0–34.0)
Platelets (HT): 398 10 3/uL — NL (ref 150–450)
RBC (HT): 4.5 10 6/uL — NL (ref 3.80–5.20)
RDW (HT): 14.8 % — NL (ref 0.0–15.2)
WBC (HT): 7.8 10 3/uL — NL (ref 4.0–11.0)

## 2020-02-14 LAB — UNMAPPED LAB RESULTS
Hematocrit (HT): 39 % — NL (ref 35–47)
Hemoglobin (HGB) (HT): 12.3 g/dL — NL (ref 12.0–16.0)
MCHC (HT): 31.8 g/dL — NL (ref 31.0–37.5)
MCV (HT): 85 fL — NL (ref 80–100)
Mean Corpuscular Hemoglobin (MCH) (HT): 27.1 pg — NL (ref 26.0–34.0)
Platelets (HT): 416 10 3/uL — NL (ref 150–450)
RBC (HT): 4.54 10 6/uL — NL (ref 3.80–5.20)
RDW (HT): 14.8 % — NL (ref 0.0–15.2)
WBC (HT): 8 10 3/uL — NL (ref 4.0–11.0)

## 2020-03-24 LAB — UNMAPPED LAB RESULTS
Basophil # (HT): 0 10 3/uL — NL (ref 0.0–0.2)
Basophil % (HT): 0 % — NL (ref 0–3)
Eosinophil # (HT): 0 10 3/uL — NL (ref 0.0–0.6)
Eosinophil % (HT): 0 % — NL (ref 0–5)
Hematocrit (HT): 42 % — NL (ref 35–47)
Hemoglobin (HGB) (HT): 13.6 g/dL — NL (ref 12.0–16.0)
Lymphocyte # (HT): 1.2 10 3/uL — NL (ref 1.0–4.8)
Lymphocyte % (HT): 11 % — ABNORMAL LOW (ref 15–45)
MCHC (HT): 32.7 g/dL — NL (ref 31.0–37.5)
MCV (HT): 88 fL — NL (ref 80–100)
Mean Corpuscular Hemoglobin (MCH) (HT): 28.6 pg — NL (ref 26.0–34.0)
Monocyte # (HT): 0.4 10 3/uL — NL (ref 0.1–1.0)
Monocyte % (HT): 4 % — NL (ref 0–15)
Neutrophil # (HT): 9.4 10 3/uL — ABNORMAL HIGH (ref 1.8–8.0)
Platelets (HT): 458 10 3/uL — ABNORMAL HIGH (ref 150–450)
RBC (HT): 4.75 10 6/uL — NL (ref 3.80–5.20)
RDW (HT): 14.4 % — NL (ref 0.0–15.2)
Seg Neut % (HT): 84 % — ABNORMAL HIGH (ref 45–75)
WBC (HT): 11.1 10 3/uL — ABNORMAL HIGH (ref 4.0–11.0)

## 2020-03-25 LAB — UNMAPPED LAB RESULTS
Basophil # (HT): 0.1 10 3/uL — NL (ref 0.0–0.2)
Basophil % (HT): 0 % — NL (ref 0–3)
Eosinophil # (HT): 0 10 3/uL — NL (ref 0.0–0.6)
Eosinophil % (HT): 0 % — NL (ref 0–5)
Hematocrit (HT): 37 % — NL (ref 35–47)
Hemoglobin (HGB) (HT): 11.7 g/dL — ABNORMAL LOW (ref 12.0–16.0)
Lymphocyte # (HT): 2.1 10 3/uL — NL (ref 1.0–4.8)
Lymphocyte % (HT): 18 % — NL (ref 15–45)
MCHC (HT): 31.8 g/dL — NL (ref 31.0–37.5)
MCV (HT): 87 fL — NL (ref 80–100)
Mean Corpuscular Hemoglobin (MCH) (HT): 27.8 pg — NL (ref 26.0–34.0)
Monocyte # (HT): 0.6 10 3/uL — NL (ref 0.1–1.0)
Monocyte % (HT): 5 % — NL (ref 0–15)
Neutrophil # (HT): 8.9 10 3/uL — ABNORMAL HIGH (ref 1.8–8.0)
Platelets (HT): 475 10 3/uL — ABNORMAL HIGH (ref 150–450)
RBC (HT): 4.21 10 6/uL — NL (ref 3.80–5.20)
RDW (HT): 14.5 % — NL (ref 0.0–15.2)
Seg Neut % (HT): 76 % — ABNORMAL HIGH (ref 45–75)
WBC (HT): 11.7 10 3/uL — ABNORMAL HIGH (ref 4.0–11.0)

## 2020-05-19 LAB — UNMAPPED LAB RESULTS
ABO RH Blood Type (HT): A POS — NL
Antibody Screen (HT): NEGATIVE — NL
Basophil # (HT): 0.1 10 3/uL — NL (ref 0.0–0.2)
Basophil # (HT): 0.1 10 3/uL — NL (ref 0.0–0.2)
Basophil % (HT): 0 % — NL (ref 0–3)
Basophil % (HT): 0 % — NL (ref 0–3)
Eosinophil # (HT): 0 10 3/uL — NL (ref 0.0–0.6)
Eosinophil # (HT): 0.1 10 3/uL — NL (ref 0.0–0.6)
Eosinophil % (HT): 0 % — NL (ref 0–5)
Eosinophil % (HT): 0 % — NL (ref 0–5)
Hematocrit (HT): 44 % — NL (ref 35–47)
Hematocrit (HT): 39 % — NL (ref 35–47)
Hematocrit (HT): 36 % — NL (ref 35–47)
Hemoglobin (HGB) (HT): 14.5 g/dL — NL (ref 12.0–16.0)
Hemoglobin (HGB) (HT): 12.4 g/dL — NL (ref 12.0–16.0)
Hemoglobin (HGB) (HT): 11.4 g/dL — ABNORMAL LOW (ref 12.0–16.0)
Lymphocyte # (HT): 1.5 10 3/uL — NL (ref 1.0–4.8)
Lymphocyte # (HT): 2.1 10 3/uL — NL (ref 1.0–4.8)
Lymphocyte % (HT): 11 % — ABNORMAL LOW (ref 15–45)
Lymphocyte % (HT): 17 % — NL (ref 15–45)
MCHC (HT): 32.7 g/dL — NL (ref 31.0–37.5)
MCHC (HT): 31.7 g/dL — NL (ref 31.0–37.5)
MCV (HT): 87 fL — NL (ref 80–100)
MCV (HT): 89 fL — NL (ref 80–100)
Mean Corpuscular Hemoglobin (MCH) (HT): 28.4 pg — NL (ref 26.0–34.0)
Mean Corpuscular Hemoglobin (MCH) (HT): 28.1 pg — NL (ref 26.0–34.0)
Monocyte # (HT): 0.8 10 3/uL — NL (ref 0.1–1.0)
Monocyte # (HT): 0.9 10 3/uL — NL (ref 0.1–1.0)
Monocyte % (HT): 6 % — NL (ref 0–15)
Monocyte % (HT): 7 % — NL (ref 0–15)
Neutrophil # (HT): 10.8 10 3/uL — ABNORMAL HIGH (ref 1.8–8.0)
Neutrophil # (HT): 9.4 10 3/uL — ABNORMAL HIGH (ref 1.8–8.0)
Platelets (HT): 551 10 3/uL — ABNORMAL HIGH (ref 150–450)
Platelets (HT): 442 10 3/uL — NL (ref 150–450)
RBC (HT): 5.1 10 6/uL — NL (ref 3.80–5.20)
RBC (HT): 4.41 10 6/uL — NL (ref 3.80–5.20)
RDW (HT): 13.9 % — NL (ref 0.0–15.2)
RDW (HT): 14 % — NL (ref 0.0–15.2)
Seg Neut % (HT): 82 % — ABNORMAL HIGH (ref 45–75)
Seg Neut % (HT): 76 % — ABNORMAL HIGH (ref 45–75)
WBC (HT): 13.2 10 3/uL — ABNORMAL HIGH (ref 4.0–11.0)
WBC (HT): 12.5 10 3/uL — ABNORMAL HIGH (ref 4.0–11.0)

## 2020-05-20 LAB — UNMAPPED LAB RESULTS
Basophil # (HT): 0 10 3/uL — NL (ref 0.0–0.2)
Basophil % (HT): 1 % — NL (ref 0–3)
Eosinophil # (HT): 0.1 10 3/uL — NL (ref 0.0–0.6)
Eosinophil % (HT): 1 % — NL (ref 0–5)
Hematocrit (HT): 34 % — ABNORMAL LOW (ref 35–47)
Hematocrit (HT): 35 % — NL (ref 35–47)
Hemoglobin (HGB) (HT): 11 g/dL — ABNORMAL LOW (ref 12.0–16.0)
Hemoglobin (HGB) (HT): 11.1 g/dL — ABNORMAL LOW (ref 12.0–16.0)
Lymphocyte # (HT): 2.2 10 3/uL — NL (ref 1.0–4.8)
Lymphocyte % (HT): 25 % — NL (ref 15–45)
MCHC (HT): 31.5 g/dL — NL (ref 31.0–37.5)
MCV (HT): 90 fL — NL (ref 80–100)
Mean Corpuscular Hemoglobin (MCH) (HT): 28.3 pg — NL (ref 26.0–34.0)
Monocyte # (HT): 0.5 10 3/uL — NL (ref 0.1–1.0)
Monocyte % (HT): 6 % — NL (ref 0–15)
Neutrophil # (HT): 6 10 3/uL — NL (ref 1.8–8.0)
Platelets (HT): 362 10 3/uL — NL (ref 150–450)
RBC (HT): 3.89 10 6/uL — NL (ref 3.80–5.20)
RDW (HT): 14.1 % — NL (ref 0.0–15.2)
Seg Neut % (HT): 68 % — NL (ref 45–75)
WBC (HT): 8.9 10 3/uL — NL (ref 4.0–11.0)

## 2020-05-21 LAB — UNMAPPED LAB RESULTS
Basophil # (HT): 0 10 3/uL — NL (ref 0.0–0.2)
Basophil % (HT): 1 % — NL (ref 0–3)
Eosinophil # (HT): 0.1 10 3/uL — NL (ref 0.0–0.6)
Eosinophil % (HT): 1 % — NL (ref 0–5)
Hematocrit (HT): 35 % — NL (ref 35–47)
Hemoglobin (HGB) (HT): 11.1 g/dL — ABNORMAL LOW (ref 12.0–16.0)
Lymphocyte # (HT): 1.8 10 3/uL — NL (ref 1.0–4.8)
Lymphocyte % (HT): 22 % — NL (ref 15–45)
MCHC (HT): 31.8 g/dL — NL (ref 31.0–37.5)
MCV (HT): 90 fL — NL (ref 80–100)
Mean Corpuscular Hemoglobin (MCH) (HT): 28.5 pg — NL (ref 26.0–34.0)
Monocyte # (HT): 0.5 10 3/uL — NL (ref 0.1–1.0)
Monocyte % (HT): 6 % — NL (ref 0–15)
Neutrophil # (HT): 5.5 10 3/uL — NL (ref 1.8–8.0)
Platelets (HT): 329 10 3/uL — NL (ref 150–450)
RBC (HT): 3.9 10 6/uL — NL (ref 3.80–5.20)
RDW (HT): 13.7 % — NL (ref 0.0–15.2)
Seg Neut % (HT): 69 % — NL (ref 45–75)
WBC (HT): 7.9 10 3/uL — NL (ref 4.0–11.0)

## 2020-05-22 LAB — UNMAPPED LAB RESULTS
Basophil # (HT): 0 10 3/uL — NL (ref 0.0–0.2)
Basophil % (HT): 1 % — NL (ref 0–3)
Eosinophil # (HT): 0.1 10 3/uL — NL (ref 0.0–0.6)
Eosinophil % (HT): 2 % — NL (ref 0–5)
Hematocrit (HT): 37 % — NL (ref 35–47)
Hemoglobin (HGB) (HT): 11.7 g/dL — ABNORMAL LOW (ref 12.0–16.0)
Lymphocyte # (HT): 1.6 10 3/uL — NL (ref 1.0–4.8)
Lymphocyte % (HT): 18 % — NL (ref 15–45)
MCHC (HT): 31.9 g/dL — NL (ref 31.0–37.5)
MCV (HT): 89 fL — NL (ref 80–100)
Mean Corpuscular Hemoglobin (MCH) (HT): 28.3 pg — NL (ref 26.0–34.0)
Monocyte # (HT): 0.4 10 3/uL — NL (ref 0.1–1.0)
Monocyte % (HT): 5 % — NL (ref 0–15)
Neutrophil # (HT): 6.6 10 3/uL — NL (ref 1.8–8.0)
Platelets (HT): 333 10 3/uL — NL (ref 150–450)
RBC (HT): 4.13 10 6/uL — NL (ref 3.80–5.20)
RDW (HT): 13.5 % — NL (ref 0.0–15.2)
Seg Neut % (HT): 75 % — NL (ref 45–75)
WBC (HT): 8.8 10 3/uL — NL (ref 4.0–11.0)

## 2020-05-23 LAB — UNMAPPED LAB RESULTS
Basophil # (HT): 0 10 3/uL — NL (ref 0.0–0.2)
Basophil % (HT): 1 % — NL (ref 0–3)
Eosinophil # (HT): 0.2 10 3/uL — NL (ref 0.0–0.6)
Eosinophil % (HT): 2 % — NL (ref 0–5)
Hematocrit (HT): 37 % — NL (ref 35–47)
Hemoglobin (HGB) (HT): 11.9 g/dL — ABNORMAL LOW (ref 12.0–16.0)
Lymphocyte # (HT): 1.6 10 3/uL — NL (ref 1.0–4.8)
Lymphocyte % (HT): 21 % — NL (ref 15–45)
MCHC (HT): 32.1 g/dL — NL (ref 31.0–37.5)
MCV (HT): 88 fL — NL (ref 80–100)
Mean Corpuscular Hemoglobin (MCH) (HT): 28.2 pg — NL (ref 26.0–34.0)
Monocyte # (HT): 0.5 10 3/uL — NL (ref 0.1–1.0)
Monocyte % (HT): 6 % — NL (ref 0–15)
Neutrophil # (HT): 5.4 10 3/uL — NL (ref 1.8–8.0)
Platelets (HT): 377 10 3/uL — NL (ref 150–450)
RBC (HT): 4.22 10 6/uL — NL (ref 3.80–5.20)
RDW (HT): 13.5 % — NL (ref 0.0–15.2)
Seg Neut % (HT): 70 % — NL (ref 45–75)
WBC (HT): 7.7 10 3/uL — NL (ref 4.0–11.0)

## 2020-05-24 LAB — UNMAPPED LAB RESULTS
Basophil # (HT): 0 10 3/uL — NL (ref 0.0–0.2)
Basophil % (HT): 1 % — NL (ref 0–3)
Eosinophil # (HT): 0.2 10 3/uL — NL (ref 0.0–0.6)
Eosinophil % (HT): 3 % — NL (ref 0–5)
Hematocrit (HT): 37 % — NL (ref 35–47)
Hemoglobin (HGB) (HT): 11.5 g/dL — ABNORMAL LOW (ref 12.0–16.0)
Lymphocyte # (HT): 1.6 10 3/uL — NL (ref 1.0–4.8)
Lymphocyte % (HT): 29 % — NL (ref 15–45)
MCHC (HT): 31.5 g/dL — NL (ref 31.0–37.5)
MCV (HT): 90 fL — NL (ref 80–100)
Mean Corpuscular Hemoglobin (MCH) (HT): 28.3 pg — NL (ref 26.0–34.0)
Monocyte # (HT): 0.3 10 3/uL — NL (ref 0.1–1.0)
Monocyte % (HT): 5 % — NL (ref 0–15)
Neutrophil # (HT): 3.4 10 3/uL — NL (ref 1.8–8.0)
Platelets (HT): 364 10 3/uL — NL (ref 150–450)
RBC (HT): 4.06 10 6/uL — NL (ref 3.80–5.20)
RDW (HT): 13.7 % — NL (ref 0.0–15.2)
Seg Neut % (HT): 62 % — NL (ref 45–75)
WBC (HT): 5.4 10 3/uL — NL (ref 4.0–11.0)

## 2020-05-25 ENCOUNTER — Encounter: Payer: Self-pay | Admitting: Geriatric Medicine

## 2020-05-25 ENCOUNTER — Emergency Department
Admission: EM | Admit: 2020-05-25 | Discharge: 2020-05-25 | Disposition: A | Discharge status: Home / Self Care | Payer: Medicaid Other | Source: Ambulatory Visit | Attending: Emergency Medicine | Admitting: Emergency Medicine

## 2020-05-25 ENCOUNTER — Emergency Department: Payer: Medicaid Other

## 2020-05-25 DIAGNOSIS — K838 Other specified diseases of biliary tract: Secondary | ICD-10-CM

## 2020-05-25 DIAGNOSIS — R112 Nausea with vomiting, unspecified: Secondary | ICD-10-CM | POA: Insufficient documentation

## 2020-05-25 DIAGNOSIS — R5383 Other fatigue: Secondary | ICD-10-CM

## 2020-05-25 DIAGNOSIS — R1011 Right upper quadrant pain: Secondary | ICD-10-CM

## 2020-05-25 DIAGNOSIS — R197 Diarrhea, unspecified: Secondary | ICD-10-CM | POA: Insufficient documentation

## 2020-05-25 DIAGNOSIS — R109 Unspecified abdominal pain: Secondary | ICD-10-CM

## 2020-05-25 DIAGNOSIS — R160 Hepatomegaly, not elsewhere classified: Secondary | ICD-10-CM | POA: Insufficient documentation

## 2020-05-25 DIAGNOSIS — Z9049 Acquired absence of other specified parts of digestive tract: Secondary | ICD-10-CM

## 2020-05-25 LAB — RUQ PANEL (ED ONLY)
ALT: 11 U/L (ref 0–35)
AST: 12 U/L (ref 0–35)
Albumin: 4.7 g/dL (ref 3.5–5.2)
Alk Phos: 64 U/L (ref 35–105)
Amylase: 33 U/L (ref 28–100)
Bilirubin,Direct: <0.2 mg/dL (ref 0.0–0.3)
Bilirubin,Total: <0.2 mg/dL (ref 0.0–1.2)
Lipase: 20 U/L (ref 13–60)
Total Protein: 7.5 g/dL (ref 6.3–7.7)

## 2020-05-25 LAB — CBC AND DIFFERENTIAL
Baso # K/uL: 0 10*3/uL (ref 0.0–0.1)
Basophil %: 0.4 %
Eos # K/uL: 0.1 10*3/uL (ref 0.0–0.4)
Eosinophil %: 1.5 %
Hematocrit: 42 % (ref 34–45)
Hemoglobin: 13.2 g/dL (ref 11.2–15.7)
IMM Granulocytes #: 0 10*3/uL (ref 0.0–0.0)
IMM Granulocytes: 0.3 %
Lymph # K/uL: 1.6 10*3/uL (ref 1.2–3.7)
Lymphocyte %: 17.7 %
MCH: 28 pg (ref 26–32)
MCHC: 32 g/dL (ref 32–36)
MCV: 90 fL (ref 79–95)
Mono # K/uL: 0.4 10*3/uL (ref 0.2–0.9)
Monocyte %: 4.4 %
Neut # K/uL: 7 10*3/uL — ABNORMAL HIGH (ref 1.6–6.1)
Nucl RBC # K/uL: 0 10*3/uL (ref 0.0–0.0)
Nucl RBC %: 0 /100 WBC (ref 0.0–0.2)
Platelets: 418 10*3/uL — ABNORMAL HIGH (ref 160–370)
RBC: 4.6 MIL/uL (ref 3.9–5.2)
RDW: 14.1 % (ref 11.7–14.4)
Seg Neut %: 75.7 %
WBC: 9.2 10*3/uL (ref 4.0–10.0)

## 2020-05-25 LAB — POCT URINALYSIS DIPSTICK
Blood,UA POCT: NEGATIVE
Glucose,UA POCT: NORMAL mg/dL
Ketones,UA POCT: NEGATIVE mg/dL
Leuk Esterase,UA POCT: NEGATIVE
Lot #: 49708101
Nitrite,UA POCT: NEGATIVE
PH,UA POCT: 5 (ref 5–8)

## 2020-05-25 LAB — BASIC METABOLIC PANEL
Anion Gap: 14 (ref 7–16)
CO2: 24 mmol/L (ref 20–28)
Calcium: 9.8 mg/dL (ref 8.8–10.2)
Chloride: 107 mmol/L (ref 96–108)
Creatinine: 0.87 mg/dL (ref 0.51–0.95)
GFR,Black: 100 *
GFR,Caucasian: 87 *
Glucose: 99 mg/dL (ref 60–99)
Lab: 19 mg/dL (ref 6–20)
Potassium: 3.6 mmol/L (ref 3.3–5.1)
Sodium: 145 mmol/L (ref 133–145)

## 2020-05-25 LAB — BLOOD BANK HOLD LAVENDER

## 2020-05-25 LAB — POCT URINE PREGNANCY: Preg Test,UR POC: NEGATIVE

## 2020-05-25 MED ORDER — SODIUM CHLORIDE 0.9 % IV BOLUS *I*
1000.0000 mL | Freq: Once | Status: DC
Start: 2020-05-25 — End: 2020-05-25

## 2020-05-25 MED ORDER — DICYCLOMINE HCL 10 MG PO CAPS *I*
20.00 mg | ORAL_CAPSULE | Freq: Four times a day (QID) | ORAL | Status: DC
Start: 2020-05-24 — End: 2020-05-25

## 2020-05-25 MED ORDER — DIPHENHYDRAMINE HCL (SLEEP) 25 MG PO TABS
25.00 mg | ORAL_TABLET | Freq: Four times a day (QID) | ORAL | Status: DC | PRN
Start: ? — End: 2020-05-25

## 2020-05-25 MED ORDER — DEXTROSE 5 % FLUSH FOR PUMPS *I*
0.0000 mL/h | INTRAVENOUS | Status: DC | PRN
Start: 2020-05-25 — End: 2020-05-25

## 2020-05-25 MED ORDER — PANTOPRAZOLE SODIUM 40 MG IV SOLR *I*
40.00 mg | Freq: Every day | INTRAVENOUS | Status: DC
Start: 2020-05-25 — End: 2020-05-25

## 2020-05-25 MED ORDER — LORAZEPAM 0.5 MG PO TABS *I*
0.50 mg | ORAL_TABLET | Freq: Four times a day (QID) | ORAL | Status: DC | PRN
Start: ? — End: 2020-05-25

## 2020-05-25 MED ORDER — SODIUM CHLORIDE 0.9 % FLUSH FOR PUMPS *I*
0.0000 mL/h | INTRAVENOUS | Status: DC | PRN
Start: 2020-05-25 — End: 2020-05-25
  Administered 2020-05-25: 100 mL/h via INTRAVENOUS

## 2020-05-25 MED ORDER — HYDROMORPHONE HCL PF 1 MG/ML IJ SOLN *WRAPPED*
0.5000 mg | Freq: Once | INTRAMUSCULAR | Status: AC
Start: 2020-05-25 — End: 2020-05-25
  Administered 2020-05-25: 0.5 mg via INTRAVENOUS
  Filled 2020-05-25: qty 0.5

## 2020-05-25 MED ORDER — SODIUM CHLORIDE 0.9 % 25 ML IV SOLN *I*
12.5000 mg | Freq: Once | INTRAVENOUS | Status: AC
Start: 2020-05-25 — End: 2020-05-25
  Administered 2020-05-25: 12.5 mg via INTRAVENOUS
  Filled 2020-05-25: qty 1

## 2020-05-25 MED ORDER — KETOROLAC TROMETHAMINE 30 MG/ML IJ SOLN *I*
15.00 mg | Freq: Four times a day (QID) | INTRAMUSCULAR | Status: DC | PRN
Start: ? — End: 2020-05-25

## 2020-05-25 MED ORDER — LOPERAMIDE HCL 2 MG PO CAPS *I*
2.00 mg | ORAL_CAPSULE | Freq: Four times a day (QID) | ORAL | Status: DC | PRN
Start: ? — End: 2020-05-25

## 2020-05-25 MED ORDER — PROMETHAZINE HCL 25 MG/ML IJ SOLN *I*
12.50 mg | Freq: Four times a day (QID) | INTRAMUSCULAR | Status: DC | PRN
Start: ? — End: 2020-05-25

## 2020-05-25 NOTE — Discharge Instructions (Signed)
Please follow-up with your primary care doctor.

## 2020-05-25 NOTE — ED Notes (Signed)
Pt presents to the ED with epigastric pain, hematemesis and diarrhea for a few weeks. Pt reports temperature of 100.30F last night. Denies chest pain, SOB, or rectal bleeding. Pt has hx of GI bleed requiring blood transfusion. Pt recently at [facility] for same complaints.

## 2020-05-25 NOTE — ED Triage Notes (Signed)
Pt to ED with upper abdominal pain, diarrhea, fever, hematemesis. Pt was admitted to observation at [facility] and d/c home yesterday. Pt spiked at fever last night and was directed to come to [facility] for further evaluation.        Triage Note   Shanon Payor, RN

## 2020-05-25 NOTE — ED Provider Notes (Signed)
History     Chief Complaint   Patient presents with    Abdominal Pain    Fever     34 y.o female with a hx of GI bleed, Anemia, Depression who presents to the ER with abdominal pain, nausea, vomiting, and concerns for bloody emesis. Patient tells me that she was discharged from [facility] where she was seen for similar symptoms including diarrhea. Had a colonoscopy and CT scan there without acute findings.  He tells me that since his she has been discharged she had a temperature of 100.2.  She had 4 episodes of vomit that she felt had dark blood in it.  Also has had frequent bouts of diarrhea.  Had 5 episodes since she has been in the emergency room this morning.  The stool is not black or red.  It is green-colored and watery.  Had an upper endoscopy done 10 months ago without any acute findings.            Medical/Surgical/Family History     Past Medical History:   Diagnosis Date    Anemia     Depression     GI bleed     hemetesis        Patient Active Problem List   Diagnosis Code    Anemia D64.9    Abdominal pain, unspecified abdominal location R10.9    TOA (tubo-ovarian abscess) N70.93    s/p laparoscopic LS with extensive LOA Z98.890    Abdominal pain R10.9    Pelvic inflammatory disease N73.9    Pelvic pain R10.2            Past Surgical History:   Procedure Laterality Date    CESAREAN SECTION, UNSPECIFIED      CHOLECYSTECTOMY      GALLBLADDER SURGERY      PR ESOPHAGOGASTRODUODENOSCOPY TRANSORAL DIAGNOSTIC N/A 01/29/2019    Procedure: EGD (ESOPHAGOGASTRODUODENOSCOPY);  Surgeon: Maryann Alar, MD;  Location: [facility];  Service: GI    PR LAP,DIAGNOSTIC ABDOMEN N/A 02/03/2019    Procedure: LAPAROSCOPY, DIAGNOSTIC, EXTENSIVE LYSIS OF ADHESIONS;  Surgeon: Thompsoncarrillo, Raquel Sarna, MD;  Location: [place];  Service: OBGYN     Family History   Problem Relation Age of Onset    Colon cancer Paternal Grandmother     Breast cancer Maternal Grandmother 73    Diabetes Mother     Hypertension  Mother           Social History     Tobacco Use    Smoking status: Never Smoker    Smokeless tobacco: Never Used   Substance Use Topics    Alcohol use: Not Currently    Drug use: Never     Living Situation     Questions Responses    Patient lives with Family    Homeless No    Caregiver for other family member Yes    External Services None    Employment Employed    Domestic Violence Risk No                Review of Systems   Review of Systems   Constitutional: Positive for appetite change and fatigue. Negative for activity change, chills, diaphoresis and fever.   HENT: Negative for congestion, sore throat, trouble swallowing and voice change.    Eyes: Negative.  Negative for visual disturbance.   Respiratory: Negative for cough, chest tightness, shortness of breath and stridor.    Cardiovascular: Negative for chest pain, palpitations and leg swelling.  Gastrointestinal: Positive for abdominal pain, diarrhea, nausea and vomiting. Negative for abdominal distention and constipation.   Genitourinary: Negative for difficulty urinating, dyspareunia, dysuria, frequency, urgency and vaginal discharge.   Musculoskeletal: Negative for arthralgias, back pain, gait problem, joint swelling, myalgias and neck pain.   Skin: Negative for color change, pallor, rash and wound.   Neurological: Negative for dizziness, weakness, light-headedness, numbness and headaches.   Psychiatric/Behavioral: Negative.        Physical Exam     Triage Vitals  Triage Start: Start, (05/25/20 1005)   First Recorded BP: (!) 133/91, Resp: 18, Temp: 36.1 C (97 F) Oxygen Therapy SpO2: 98 %, O2 Device: None (Room air), Heart Rate: 103, (05/25/20 1008)  .  First Pain Reported  0-10 Scale: 10, Pain Location/Orientation: Abdomen, (05/25/20 1008)       Physical Exam  Vitals and nursing note reviewed.   Constitutional:       General: She is not in acute distress.     Appearance: She is well-developed. She is not diaphoretic.   HENT:      Head: Normocephalic  and atraumatic.      Mouth/Throat:      Pharynx: No pharyngeal swelling.   Eyes:      Pupils: Pupils are equal, round, and reactive to light.   Cardiovascular:      Rate and Rhythm: Normal rate and regular rhythm.      Heart sounds: Normal heart sounds.   Pulmonary:      Effort: Pulmonary effort is normal.      Breath sounds: Normal breath sounds.   Abdominal:      General: Abdomen is flat. Bowel sounds are normal. There is no distension.      Palpations: Abdomen is soft.      Tenderness: There is abdominal tenderness in the right upper quadrant. There is no guarding or rebound.      Hernia: No hernia is present.   Genitourinary:     Rectum: Guaiac result negative. No mass.   Musculoskeletal:         General: No tenderness. Normal range of motion.      Cervical back: Normal range of motion and neck supple.   Skin:     General: Skin is warm and dry.      Coloration: Skin is not pale.   Neurological:      Mental Status: She is alert and oriented to person, place, and time.   Psychiatric:         Behavior: Behavior normal.         Thought Content: Thought content normal.         Judgment: Judgment normal.         Medical Decision Making   Patient seen by me on:  05/25/2020    Assessment:  34 year old female presents the emergency department with nausea, vomiting, diarrhea, hematemesis.    Differential diagnosis:  GI bleed  Gastroenteritis  Gastritis  Colitis    Plan:  Orders Placed This Encounter      US abdomen limited single quad      CBC and differential      Basic metabolic panel      RUQ panel (ED only)      Blood bank hold lavender      Hold blue      Initiate droplet & contact isolation with eye protection      POCT urinalysis dipstick      POCT urine pregnancy  POCT occult blood stool      Insert peripheral IV    Medications  sodium chloride 0.9 % FLUSH REQUIRED IF PATIENT HAS IV (100 mL/hr Intravenous New Bag 05/25/20 1408)  dextrose 5 % FLUSH REQUIRED IF PATIENT HAS IV (has no administration in time  range)  sodium chloride 0.9 % bolus 1,000 mL (has no administration in time range)  HYDROmorphone PF (DILAUDID) injection 0.5 mg (0.5 mg Intravenous Given 05/25/20 1408)  promethazine (PHENERGAN) 12.5 mg in sodium chloride 0.9% 25 mL IVPB (12.5 mg Intravenous New Bag 05/25/20 1408)      ED Course and Disposition:  Labs Reviewed  CBC AND DIFFERENTIAL - Abnormal; Notable for the following components:     Platelets                     418 (*)                Neut # K/uL                   7.0 (*)             All other components within normal limits  POCT URINALYSIS DIPSTICK - Abnormal; Notable for the following components:     Protein,UA POCT               + 30mg /dL (*)            All other components within normal limits  BASIC METABOLIC PANEL  RUQ PANEL (ED ONLY)  BLOOD BANK HOLD LAVENDER  HOLD BLUE  POCT URINE PREGNANCY  POCT OCCULT BLOOD STOOL    US abdomen limited single quad   Final Result        1. Postoperative sequela of cholecystectomy. There is dilatation of the common duct, similar to the prior study, likely representing postoperative change.        2. Mild hepatomegaly.            END OF IMPRESSION.                [facility] submits this DICOM format image data and final report to the [facility], an independent secure electronic health information exchange, on a reciprocally searchable basis (with patient authorization) for a minimum of 12 months after exam     date.     Work-up is reassuring.  Patient has normal labs with a negative guaiac result.  She has had a thorough work-up at [facility].     Patient needs to follow-up with her primary care doctor.  Labs and vital signs are reassuring.            Annette Edwards, NP          Jamaiyah Pyle, Janne Napoleon, NP  05/25/20 1454

## 2020-05-26 LAB — HOLD BLUE

## 2021-01-03 ENCOUNTER — Other Ambulatory Visit: Payer: Self-pay

## 2021-01-03 ENCOUNTER — Encounter (HOSPITAL_BASED_OUTPATIENT_CLINIC_OR_DEPARTMENT_OTHER): Payer: Self-pay | Admitting: Emergency Medicine

## 2021-01-03 ENCOUNTER — Emergency Department (HOSPITAL_BASED_OUTPATIENT_CLINIC_OR_DEPARTMENT_OTHER): Payer: Medicaid Other

## 2021-01-03 ENCOUNTER — Other Ambulatory Visit (HOSPITAL_BASED_OUTPATIENT_CLINIC_OR_DEPARTMENT_OTHER): Payer: Self-pay | Admitting: Physician Assistant

## 2021-01-03 ENCOUNTER — Emergency Department (HOSPITAL_BASED_OUTPATIENT_CLINIC_OR_DEPARTMENT_OTHER)
Admission: EM | Admit: 2021-01-03 | Discharge: 2021-01-03 | Disposition: A | Discharge status: Home / Self Care | Payer: Medicaid Other | Attending: Emergency Medicine | Admitting: Emergency Medicine

## 2021-01-03 DIAGNOSIS — R509 Fever, unspecified: Secondary | ICD-10-CM | POA: Insufficient documentation

## 2021-01-03 DIAGNOSIS — R197 Diarrhea, unspecified: Secondary | ICD-10-CM | POA: Diagnosis not present

## 2021-01-03 DIAGNOSIS — R109 Unspecified abdominal pain: Secondary | ICD-10-CM

## 2021-01-03 DIAGNOSIS — R112 Nausea with vomiting, unspecified: Secondary | ICD-10-CM | POA: Diagnosis not present

## 2021-01-03 DIAGNOSIS — G8929 Other chronic pain: Secondary | ICD-10-CM | POA: Insufficient documentation

## 2021-01-03 DIAGNOSIS — R1031 Right lower quadrant pain: Secondary | ICD-10-CM | POA: Insufficient documentation

## 2021-01-03 LAB — URINALYSIS, ROUTINE W REFLEX MICROSCOPIC
Bilirubin Urine: NEGATIVE
Glucose, UA: NEGATIVE mg/dL
Hgb urine dipstick: NEGATIVE
Ketones, ur: NEGATIVE mg/dL
Leukocytes,Ua: NEGATIVE
Nitrite: NEGATIVE
Protein, ur: NEGATIVE mg/dL
Specific Gravity, Urine: 1.015 (ref 1.005–1.030)
pH: 6 (ref 5.0–8.0)

## 2021-01-03 LAB — CBC
HCT: 40.6 % (ref 36.0–46.0)
Hemoglobin: 13.1 g/dL (ref 12.0–15.0)
MCH: 29.6 pg (ref 26.0–34.0)
MCHC: 32.3 g/dL (ref 30.0–36.0)
MCV: 91.9 fL (ref 80.0–100.0)
Platelets: 424 10*3/uL — ABNORMAL HIGH (ref 150–400)
RBC: 4.42 MIL/uL (ref 3.87–5.11)
RDW: 14.1 % (ref 11.5–15.5)
WBC: 9.1 10*3/uL (ref 4.0–10.5)
nRBC: 0 % (ref 0.0–0.2)

## 2021-01-03 LAB — COMPREHENSIVE METABOLIC PANEL
ALT: 13 U/L (ref 0–44)
AST: 13 U/L — ABNORMAL LOW (ref 15–41)
Albumin: 4.4 g/dL (ref 3.5–5.0)
Alkaline Phosphatase: 55 U/L (ref 38–126)
Anion gap: 9 (ref 5–15)
BUN: 15 mg/dL (ref 6–20)
CO2: 23 mmol/L (ref 22–32)
Calcium: 9.1 mg/dL (ref 8.9–10.3)
Chloride: 102 mmol/L (ref 98–111)
Creatinine, Ser: 0.8 mg/dL (ref 0.44–1.00)
GFR, Estimated: >60 mL/min (ref >60)
Glucose, Bld: 116 mg/dL — ABNORMAL HIGH (ref 70–99)
Potassium: 3.8 mmol/L (ref 3.5–5.1)
Sodium: 134 mmol/L — ABNORMAL LOW (ref 135–145)
Total Bilirubin: 0.2 mg/dL — ABNORMAL LOW (ref 0.3–1.2)
Total Protein: 8.1 g/dL (ref 6.5–8.1)

## 2021-01-03 LAB — LIPASE, BLOOD: Lipase: 25 U/L (ref 11–51)

## 2021-01-03 MED ORDER — MORPHINE SULFATE (PF) 4 MG/ML IV SOLN
4.0000 mg | Freq: Once | INTRAVENOUS | Status: AC
  Administered 2021-01-03: 4 mg via INTRAVENOUS
  Filled 2021-01-03: qty 1

## 2021-01-03 MED ORDER — SODIUM CHLORIDE 0.9 % IV BOLUS
1000.0000 mL | Freq: Once | INTRAVENOUS | Status: AC
  Administered 2021-01-03: 1000 mL via INTRAVENOUS

## 2021-01-03 MED ORDER — IOHEXOL 300 MG/ML  SOLN: 100.0000 mL | Freq: Once | INTRAMUSCULAR | Status: DC | PRN

## 2021-01-03 MED ORDER — ONDANSETRON HCL 4 MG/2ML IJ SOLN
4.0000 mg | Freq: Once | INTRAMUSCULAR | Status: AC
  Administered 2021-01-03: 4 mg via INTRAVENOUS
  Filled 2021-01-03: qty 2

## 2021-01-03 MED ORDER — ONDANSETRON HCL 4 MG PO TABS: 4.0000 mg | ORAL_TABLET | Freq: Four times a day (QID) | ORAL | 0 refills | Status: DC

## 2021-01-03 MED FILL — ONDANSETRON HCL 4 MG TABLET: 4 | 7 days supply | Qty: 28 | Fill #0

## 2021-01-03 NOTE — ED Notes (Signed)
Pt unable to provide urine at this time at triage. 

## 2021-01-03 NOTE — ED Provider Notes (Signed)
[facility] EMERGENCY DEPARTMENT Provider Note   CSN: [id number] Arrival date & time: 01/03/21  1145     History Chief Complaint  Patient presents with  . Abdominal Pain    Bethany [name] is a 35 y.o. female.  35 y.o female with a PMH Anemia presents to the ED with a chief complaint abdominal pain x 1 month. Patient describes symptoms as sharp pain to the lower abdominal region, does not radiate anywhere.  This is exacerbated with movement.  Has been taken Tylenol to help with symptoms without much improvement.  In addition, patient has had a fever for the last couple days reporting a T-max of 1-1.2.  She also endorses multiple episodes of nonbilious, emesis.  She reports noted streaks of blood to her emesis. Has received both of her Covid vaccines. No vaginal discharge, vaginal bleeding or urinary symptoms.  Prior surgical history remarkable for cholecystectomy, 4 C-sections,partial hysterectomy.  The history is provided by the patient.  Abdominal Pain Pain location:  LLQ, RLQ and suprapubic Pain quality: sharp   Pain radiates to:  Does not radiate Pain severity:  Moderate Duration:  30 days Timing:  Constant Progression:  Worsening Chronicity:  New Context: not alcohol use, not diet changes, not medication withdrawal, not previous surgeries, not recent illness, not recent travel, not sick contacts and not suspicious food intake   Relieved by:  Nothing Worsened by:  Movement Ineffective treatments:  Acetaminophen Associated symptoms: diarrhea, nausea and vomiting   Associated symptoms: no chest pain, no chills, no shortness of breath and no sore throat         OB History   No obstetric history on file.     No family history on file.  Social History   Tobacco Use  . Smoking status: Never Smoker  . Smokeless tobacco: Never Used    Home Medications Prior to Admission medications   Medication Sig Start Date End Date Taking? Authorizing Provider   ferrous sulfate 325 (65 FE) MG tablet Take 1 tablet by mouth in the morning and at bedtime. 06/20/20  Yes [provider]  ondansetron (ZOFRAN) 4 MG tablet Take 1 tablet (4 mg total) by mouth every 6 (six) hours for 7 days. 01/03/21 01/10/21 Yes [place] Searles, Leonie Douglas, PA-C    Allergies    Celecoxib, Fanatrex fusepaq, and Gabapentin  Review of Systems   Review of Systems  Constitutional: Negative for chills.  HENT: Negative for sore throat.   Respiratory: Negative for shortness of breath.   Cardiovascular: Negative for chest pain.  Gastrointestinal: Positive for abdominal pain, diarrhea, nausea and vomiting. Negative for blood in stool.  Genitourinary: Negative for flank pain.  Musculoskeletal: Negative for back pain.  Skin: Negative for pallor and wound.  Neurological: Negative for light-headedness and headaches.  All other systems reviewed and are negative.   Physical Exam Updated Vital Signs BP (!) 155/94   Pulse 93   Temp 98.8 F (37.1 C) (Oral)   Resp 18   Ht 5' (1.524 m)   Wt 93.9 kg   SpO2 100%   BMI 40.43 kg/m   Physical Exam Vitals and nursing note reviewed.  Constitutional:      General: She is not in acute distress.    Appearance: She is well-developed and well-nourished.  HENT:     Head: Normocephalic and atraumatic.     Mouth/Throat:     Mouth: Oropharynx is clear and moist.     Pharynx: No oropharyngeal exudate.  Eyes:  Pupils: Pupils are equal, round, and reactive to light.  Cardiovascular:     Rate and Rhythm: Regular rhythm.     Heart sounds: Normal heart sounds.  Pulmonary:     Effort: Pulmonary effort is normal. No respiratory distress.     Breath sounds: Normal breath sounds.  Abdominal:     General: Bowel sounds are normal. There is no distension.     Palpations: Abdomen is soft.     Tenderness: There is abdominal tenderness in the right lower quadrant, suprapubic area and left lower quadrant. There is no right CVA tenderness or left CVA  tenderness.     Hernia: No hernia is present.  Musculoskeletal:        General: No tenderness or deformity.     Cervical back: Normal range of motion.     Right lower leg: No edema.     Left lower leg: No edema.  Skin:    General: Skin is warm and dry.  Neurological:     Mental Status: She is alert and oriented to person, place, and time.  Psychiatric:        Mood and Affect: Mood and affect normal.     ED Results / Procedures / Treatments   Labs (all labs ordered are listed, but only abnormal results are displayed) Labs Reviewed  COMPREHENSIVE METABOLIC PANEL - Abnormal; Notable for the following components:      Result Value   Sodium 134 (*)    Glucose, Bld 116 (*)    AST 13 (*)    Total Bilirubin 0.2 (*)    All other components within normal limits  CBC - Abnormal; Notable for the following components:   Platelets 424 (*)    All other components within normal limits  URINALYSIS, ROUTINE W REFLEX MICROSCOPIC - Abnormal; Notable for the following components:   APPearance CLOUDY (*)    All other components within normal limits  LIPASE, BLOOD    EKG None  Radiology No results found.  Procedures Procedures   Medications Ordered in ED Medications  iohexol (OMNIPAQUE) 300 MG/ML solution 100 mL (has no administration in time range)  sodium chloride 0.9 % bolus 1,000 mL (0 mLs Intravenous Stopped 01/03/21 1512)  ondansetron (ZOFRAN) injection 4 mg (4 mg Intravenous Given 01/03/21 1423)  morphine 4 MG/ML injection 4 mg (4 mg Intravenous Given 01/03/21 1423)    ED Course  I have reviewed the triage vital signs and the nursing notes.  Pertinent labs & imaging results that were available during my care of the patient were reviewed by me and considered in my medical decision making (see chart for details).    MDM Rules/Calculators/A&P   Patient here today with a chief complaint of abdominal pain x 1 month. This is a recurrence has been an ongoing problem. Patient  failed to mention several previous visits to the ED for the same complaint.  After extensive chart review, I do see patient has had 12 visits since the month of November for abdominal pain and hematemesis. Last CT Abdomen on 12/28/2020 with no abnormal findings.  She was given a referral for gastroenterology.  Patient was evaluated by gastroenterology at [facility], digestive health,3 days ago she was recommended doxepin to help with her abdominal pain.  She reports she has been taking this for her abdominal pain without much improvement.  During evaluation patient reports pain is 8/10 but is resting in bed comfortably on her phone. She does have a prior history of Mallory-Weiss tears  however no vomiting episodes have been noted on today's visit.  Has had normal bowel movements since.  Interpretation of her labs reveal a CBC with a stable hemoglobin, no leukocytosis.  Platelets are slightly elevated.  CMP without any electrolyte normality, creatinine level is within normal limits.  LFTs are at her baseline.  Lipase level is normal.  Have a prior history of a cholecystectomy.  No concern for pregnancy as patient did have a partial hysterectomy.  Does have been stable during her visit.  She does report one episode of vomiting after receiving the Zofran.  She did have a prior history of QT prolongation, will obtain EKG to further evaluate this.  EKG normal sinus rhythm,no changes consistent with infarct QT at 373, will provide with prescription for zofran to help with nausea.   3:41 PM I discussed with patient results of her lab work, we discussed the fact that she had a CT recently, discussed the fact that this is likely chronic pain that she has been having for the past month.  She should continue to follow-up with gastroenterology.  We will not be repeating the CT on today's visit as the risk and benefits of radiating her again.  Patient is agreeable of this at this time.  Return precautions discussed at  length, patient stable for discharge.   Portions of this note were generated with Scientist, clinical (histocompatibility and immunogenetics). Dictation errors may occur despite best attempts at proofreading.  Final Clinical Impression(s) / ED Diagnoses Final diagnoses:  Chronic abdominal pain    Rx / DC Orders ED Discharge Orders         Ordered    ondansetron (ZOFRAN) 4 MG tablet  Every 6 hours        01/03/21 1539           Claude Manges, PA-C 01/03/21 1549    Virgina Norfolk, DO 01/04/21 [phone]

## 2021-01-03 NOTE — ED Triage Notes (Signed)
Generalized abdominal pain x one month.  Vomiting since yesterday.  Pt has seen some streaks of blood and was concerned.  Fever 2 days ago.

## 2021-01-03 NOTE — Discharge Instructions (Signed)
I have prescribed medication to help with your nausea, please take 1 tablet every 6 hours.  You will need to continue taking the medication prescribed by gastroenterology, you will likely need to continue following up with them for your chronic abdominal pain.

## 2021-01-07 ENCOUNTER — Emergency Department (HOSPITAL_COMMUNITY)
Admission: EM | Admit: 2021-01-07 | Discharge: 2021-01-07 | Disposition: A | Discharge status: Home / Self Care | Payer: Medicaid Other | Attending: Emergency Medicine | Admitting: Emergency Medicine

## 2021-01-07 ENCOUNTER — Other Ambulatory Visit: Payer: Self-pay

## 2021-01-07 ENCOUNTER — Emergency Department (HOSPITAL_COMMUNITY): Payer: Medicaid Other

## 2021-01-07 ENCOUNTER — Other Ambulatory Visit (HOSPITAL_COMMUNITY): Payer: Self-pay | Admitting: Student

## 2021-01-07 ENCOUNTER — Encounter (HOSPITAL_COMMUNITY): Payer: Self-pay

## 2021-01-07 DIAGNOSIS — R1033 Periumbilical pain: Secondary | ICD-10-CM | POA: Diagnosis present

## 2021-01-07 DIAGNOSIS — R112 Nausea with vomiting, unspecified: Secondary | ICD-10-CM | POA: Diagnosis not present

## 2021-01-07 DIAGNOSIS — R1084 Generalized abdominal pain: Secondary | ICD-10-CM | POA: Insufficient documentation

## 2021-01-07 DIAGNOSIS — Z9049 Acquired absence of other specified parts of digestive tract: Secondary | ICD-10-CM | POA: Insufficient documentation

## 2021-01-07 HISTORY — DX: Anemia, unspecified: D64.9

## 2021-01-07 LAB — COMPREHENSIVE METABOLIC PANEL
ALT: 14 U/L (ref 0–44)
AST: 11 U/L — ABNORMAL LOW (ref 15–41)
Albumin: 4.5 g/dL (ref 3.5–5.0)
Alkaline Phosphatase: 55 U/L (ref 38–126)
Anion gap: 10 (ref 5–15)
BUN: 14 mg/dL (ref 6–20)
CO2: 24 mmol/L (ref 22–32)
Calcium: 9.4 mg/dL (ref 8.9–10.3)
Chloride: 104 mmol/L (ref 98–111)
Creatinine, Ser: 0.76 mg/dL (ref 0.44–1.00)
GFR, Estimated: >60 mL/min (ref >60)
Glucose, Bld: 90 mg/dL (ref 70–99)
Potassium: 4 mmol/L (ref 3.5–5.1)
Sodium: 138 mmol/L (ref 135–145)
Total Bilirubin: 0.3 mg/dL (ref 0.3–1.2)
Total Protein: 8.1 g/dL (ref 6.5–8.1)

## 2021-01-07 LAB — CBC
HCT: 40.1 % (ref 36.0–46.0)
Hemoglobin: 12.6 g/dL (ref 12.0–15.0)
MCH: 29.7 pg (ref 26.0–34.0)
MCHC: 31.4 g/dL (ref 30.0–36.0)
MCV: 94.6 fL (ref 80.0–100.0)
Platelets: 433 10*3/uL — ABNORMAL HIGH (ref 150–400)
RBC: 4.24 MIL/uL (ref 3.87–5.11)
RDW: 13.7 % (ref 11.5–15.5)
WBC: 8.1 10*3/uL (ref 4.0–10.5)
nRBC: 0 % (ref 0.0–0.2)

## 2021-01-07 LAB — I-STAT BETA HCG BLOOD, ED (MC, WL, AP ONLY): I-stat hCG, quantitative: <5 m[IU]/mL (ref <5)

## 2021-01-07 LAB — PROTIME-INR
INR: 0.9 (ref 0.8–1.2)
Prothrombin Time: 11.7 seconds (ref 11.4–15.2)

## 2021-01-07 LAB — APTT: aPTT: 35 seconds (ref 24–36)

## 2021-01-07 LAB — TYPE AND SCREEN
ABO/RH(D): A POS
Antibody Screen: NEGATIVE

## 2021-01-07 LAB — LIPASE, BLOOD: Lipase: 23 U/L (ref 11–51)

## 2021-01-07 MED ORDER — SUCRALFATE 1 G PO TABS: 1.0000 g | ORAL_TABLET | Freq: Once | ORAL | Status: DC

## 2021-01-07 MED ORDER — PROMETHAZINE HCL 25 MG/ML IJ SOLN
25.0000 mg | Freq: Once | INTRAMUSCULAR | Status: AC
  Administered 2021-01-07: 25 mg via INTRAVENOUS
  Filled 2021-01-07: qty 1

## 2021-01-07 MED ORDER — DICYCLOMINE HCL 20 MG PO TABS
20.0000 mg | ORAL_TABLET | Freq: Once | ORAL | Status: AC
  Administered 2021-01-07: 20 mg via ORAL
  Filled 2021-01-07: qty 1

## 2021-01-07 MED ORDER — PROMETHAZINE HCL 12.5 MG RE SUPP: 12.5000 mg | Freq: Three times a day (TID) | RECTAL | 0 refills | Status: DC | PRN

## 2021-01-07 MED ORDER — DROPERIDOL 2.5 MG/ML IJ SOLN
1.2500 mg | Freq: Once | INTRAMUSCULAR | Status: AC
  Administered 2021-01-07: 1.25 mg via INTRAVENOUS
  Filled 2021-01-07: qty 2

## 2021-01-07 MED ORDER — SODIUM CHLORIDE 0.9 % IV BOLUS
1000.0000 mL | Freq: Once | INTRAVENOUS | Status: AC
  Administered 2021-01-07: 1000 mL via INTRAVENOUS

## 2021-01-07 MED ORDER — OMEPRAZOLE 20 MG PO CPDR: 20.0000 mg | DELAYED_RELEASE_CAPSULE | Freq: Every day | ORAL | 0 refills | Status: DC

## 2021-01-07 MED ORDER — PANTOPRAZOLE SODIUM 40 MG IV SOLR
40.0000 mg | Freq: Once | INTRAVENOUS | Status: AC
  Administered 2021-01-07: 40 mg via INTRAVENOUS
  Filled 2021-01-07: qty 40

## 2021-01-07 MED ORDER — PROMETHAZINE HCL 12.5 MG RE SUPP: 12.5000 mg | Freq: Four times a day (QID) | RECTAL | 0 refills | Status: DC | PRN

## 2021-01-07 MED ORDER — DICYCLOMINE HCL 10 MG PO CAPS: 20.0000 mg | ORAL_CAPSULE | Freq: Once | ORAL | Status: DC

## 2021-01-07 NOTE — Discharge Instructions (Signed)
You are seen her for nausea and vomiting.  Lab work and imaging all looks reassuring.  I have given you a prescription for Phenergan which will help you with your nausea.  Have also started an acid pill please take as prescribed.  I like you to follow-up with your GI doctor for further evaluation.  Come back to the emergency department if you develop chest pain, shortness of breath, severe abdominal pain, uncontrolled nausea, vomiting, diarrhea.

## 2021-01-07 NOTE — ED Notes (Signed)
Patient transported to X-ray 

## 2021-01-07 NOTE — ED Triage Notes (Addendum)
Patient reports that she is having mid abdominal pain x one month and states she has been vomiting bright red that has been a small amount, but today the amount of blood was larger. Patient was seen at [facility] on 01/03/21 for the same.  While in triage, the patient vomited bright red blood approx 60 ml.

## 2021-01-07 NOTE — ED Provider Notes (Signed)
[facility]-EMERGENCY DEPT Provider Note   CSN: [id number] Arrival date & time: 01/07/21  1301     History Chief Complaint  Patient presents with  . Abdominal Pain  . Hematemesis    Bethany [name] is a 35 y.o. female.  HPI    Patient with no significant medical history presents to the with chief complaint of periumbilical pain nausea and hematemesis. Patient endorses pain has been going on for the last month, she describes the pain around her umbilicus and radiates all around her abdomen. She describes it as a sharp sensation, worsening with food and has associated nausea and vomiting. She states she seen a small amount of blood in her vomit but today she has noticed more blood in it. She has no history of stomach ulcers, denies NSAID or alcohol use, denies smoking, denies recent endoscopy procedures. patient does endorse that she has had her gallbladder and her uterus removed. She has no other significant abdominal history. She is currently being seen by her GI doctor who started her on doxepin as well as Phenergan. Patient has been seen 9 times this month for the same complaint, all with benign work-ups. Her last CT abdomen pelvis was on 1/18 without significant findings. Patient denies any alleviating factors. She is max against COVID-19. Not immunocompromise at this time. Patient denies headaches, fevers, chills, shortness of breath, constipation, diarrhea, urinary symptoms, worsening pedal edema.   Past Medical History:  Diagnosis Date  . Anemia     There are no problems to display for this patient.   Past Surgical History:  Procedure Laterality Date  . ABDOMINAL HYSTERECTOMY    . CESAREAN SECTION     x 4  . CHOLECYSTECTOMY       OB History   No obstetric history on file.     Family History  Problem Relation Age of Onset  . Diabetes Mother   . Hypertension Mother     Social History   Tobacco Use  . Smoking status: Never Smoker  .  Smokeless tobacco: Never Used  Vaping Use  . Vaping Use: Never used  Substance Use Topics  . Alcohol use: Never  . Drug use: Never    Home Medications Prior to Admission medications   Medication Sig Start Date End Date Taking? Authorizing Provider  ferrous sulfate 325 (65 FE) MG tablet Take 1 tablet by mouth in the morning and at bedtime. 06/20/20  Yes [provider]  omeprazole (PRILOSEC) 20 MG capsule Take 1 capsule (20 mg total) by mouth daily. 01/07/21 02/06/21 Yes Carroll Sage, PA-C  promethazine (PHENERGAN) 25 MG tablet Take 25 mg by mouth every 6 (six) hours as needed for nausea or vomiting.   Yes [provider]  ondansetron (ZOFRAN) 4 MG tablet Take 1 tablet (4 mg total) by mouth every 6 (six) hours for 7 days. Patient not taking: No sig reported 01/03/21 01/10/21  Claude Manges, PA-C  promethazine (PHENERGAN) 12.5 MG suppository Place 1 suppository (12.5 mg total) rectally every 8 (eight) hours as needed for up to 5 days for nausea or vomiting. 01/07/21 01/12/21  Carroll Sage, PA-C    Allergies    Celecoxib, Fanatrex fusepaq, and Gabapentin  Review of Systems   Review of Systems  Constitutional: Negative for chills and fever.  HENT: Negative for congestion.   Respiratory: Negative for shortness of breath.   Cardiovascular: Negative for chest pain.  Gastrointestinal: Positive for abdominal pain, nausea and vomiting. Negative for blood in  stool, constipation and diarrhea.  Genitourinary: Negative for dyspareunia, dysuria and enuresis.  Musculoskeletal: Negative for back pain.  Skin: Negative for rash.  Neurological: Negative for dizziness and headaches.  Hematological: Does not bruise/bleed easily.    Physical Exam Updated Vital Signs BP (!) 148/111 (BP Location: Left Arm)   Pulse 96   Temp 98.6 F (37 C) (Oral)   Resp 14   Ht 5' (1.524 m)   Wt 93.9 kg   SpO2 100%   BMI 40.43 kg/m   Physical Exam Vitals and nursing note reviewed.   Constitutional:      General: She is not in acute distress.    Appearance: She is not ill-appearing.  HENT:     Head: Normocephalic and atraumatic.     Nose: No congestion.     Mouth/Throat:     Mouth: Mucous membranes are moist.     Pharynx: Oropharynx is clear. No oropharyngeal exudate or posterior oropharyngeal erythema.  Eyes:     Conjunctiva/sclera: Conjunctivae normal.  Cardiovascular:     Rate and Rhythm: Normal rate and regular rhythm.     Pulses: Normal pulses.     Heart sounds: No murmur heard. No friction rub. No gallop.   Pulmonary:     Effort: No respiratory distress.     Breath sounds: No wheezing, rhonchi or rales.  Abdominal:     Palpations: Abdomen is soft.     Tenderness: There is abdominal tenderness.     Comments: Patient's abdomen is nondistended, normoactive bowel sounds, she had generalized tenderness, no Murphy sign, no McBurney point, negative peritoneal sign.  She had negative CVA tenderness.  Musculoskeletal:     Right lower leg: No edema.     Left lower leg: No edema.  Skin:    General: Skin is warm and dry.  Neurological:     Mental Status: She is alert.  Psychiatric:        Mood and Affect: Mood normal.     ED Results / Procedures / Treatments   Labs (all labs ordered are listed, but only abnormal results are displayed) Labs Reviewed  COMPREHENSIVE METABOLIC PANEL - Abnormal; Notable for the following components:      Result Value   AST 11 (*)    All other components within normal limits  CBC - Abnormal; Notable for the following components:   Platelets 433 (*)    All other components within normal limits  PROTIME-INR  APTT  LIPASE, BLOOD  I-STAT BETA HCG BLOOD, ED ([facility], [facility], [facility] ONLY)  POC OCCULT BLOOD, ED  TYPE AND SCREEN  ABO/RH    EKG EKG Interpretation  Date/Time:  Friday January 07 2021 17:56:08 EST Ventricular Rate:  91 PR Interval:    QRS Duration: 98 QT Interval:  382 QTC Calculation: 470 R Axis:   87 Text  Interpretation: Sinus rhythm Low voltage, precordial leads Artifact Otherwise within normal limits Confirmed by Gerhard Munch [id number] on 01/07/2021 6:04:06 PM   Radiology DG Abdomen Acute W/Chest  Result Date: 01/07/2021 CLINICAL DATA:  Vomiting. EXAM: DG ABDOMEN ACUTE WITH 1 VIEW CHEST COMPARISON:  None. FINDINGS: The lungs are essentially clear. No pneumothorax or large pleural effusion. No focal infiltrate. The heart size is unremarkable. The trachea is midline. The bowel gas pattern is nonobstructive. The patient is likely status post prior cholecystectomy. There are no definite radiopaque kidney stones. The stool burden is average. There are calcifications in the patient's pelvis favored to represent phleboliths. IMPRESSION: 1. No acute cardiopulmonary  disease. 2. Nonobstructive bowel gas pattern. 3. Average stool burden. Electronically Signed   By: Katherine Mantle M.D.   On: 01/07/2021 19:17    Procedures Procedures   Medications Ordered in ED Medications  promethazine (PHENERGAN) injection 25 mg (25 mg Intravenous Given 01/07/21 1824)  dicyclomine (BENTYL) tablet 20 mg (20 mg Oral Given 01/07/21 1859)  droperidol (INAPSINE) 2.5 MG/ML injection 1.25 mg (1.25 mg Intravenous Given 01/07/21 1929)  sodium chloride 0.9 % bolus 1,000 mL (0 mLs Intravenous Stopped 01/07/21 1952)  pantoprazole (PROTONIX) injection 40 mg (40 mg Intravenous Given 01/07/21 1929)    ED Course  I have reviewed the triage vital signs and the nursing notes.  Pertinent labs & imaging results that were available during my care of the patient were reviewed by me and considered in my medical decision making (see chart for details).    MDM Rules/Calculators/A&P                         Patient presents with abdominal pain.  She is alert, does not appear acute distress, vital signs reassuring.  Triage has ordered lab work, will add on i-STAT pregnancy and acute DG chest abdomen for further evaluation.  I was notified  that patient had breakthrough vomiting after providing with Phenergan.  Will provide her with fluids, droperidol and and Protonix.  Nursing staff notified me that patient need to leave at this time.  As she has a family emergency to attend to.  Vital signs have remained stable, no acute findings noted on lab work.  She has not vomited since providing her with droperidol.  CBC negative for leukocytosis or signs anemia.  CMP does not show any electrolyte abnormalities, no AKI, no elevation in liver enzymes, no anion gap present.  Prothrombin time is 11.7, INR 0.9, APTT 35, lipase is 23 EKG sinus rhythm without ischemia no ST elevation or depression noted.  No prolonged QT.  Acute abdomen chest does not reveal any acute findings.  I have low suspicion for ACS as patient denies chest pain, shortness of breath, no signs of hypoperfusion fluid overload on my exam, EKG is without ischemia.  Low suspicion for systemic infection as patient is nontoxic-appearing, vital signs reassuring, no obvious source infection on my exam.  Low suspicion for pancreatitis as she has a lipase which is in normal limits.  Low suspicion for perforated stomach ulcer as there is no noted free area in plain film of the abdomen.  Low suspicion for diverticulitis or appendicitis as she had no lower quadrant pain, there is no peritoneal signs on exam, no leukocytosis.  Patient had recent CT abdomen pelvis does not reveal any acute findings on 1/18.  I have low suspicion for Mallory-Weiss tear as there is no free air within the mediastinum, she has no chest or substernal pain.  I suspect patient suffering from possible gastritis from consistent vomiting, will provide her with PPI, Phenergan rectally and have her follow-up with GI for further evaluation.  Vital signs have remained stable, no indication for hospital admission.  Patient discussed with attending and they agreed with assessment and plan.  Patient given at home care as well strict  return precautions.  Patient verbalized that they understood agreed to said plan.    Final Clinical Impression(s) / ED Diagnoses Final diagnoses:  Non-intractable vomiting with nausea, unspecified vomiting type  Generalized abdominal pain    Rx / DC Orders ED Discharge Orders  Ordered    omeprazole (PRILOSEC) 20 MG capsule  Daily        01/07/21 1944    promethazine (PHENERGAN) 12.5 MG suppository  Every 6 hours PRN,   Status:  Discontinued        01/07/21 1944    promethazine (PHENERGAN) 12.5 MG suppository  Every 8 hours PRN        01/07/21 1947           Barnie Del 01/07/21 Barbarann Ehlers, MD 01/08/21 1743

## 2021-01-10 MED FILL — PROMETHAZINE 12.5 MG SUPPOS: 12.5 | 2 days supply | Qty: 10 | Fill #0

## 2021-02-12 ENCOUNTER — Encounter (HOSPITAL_COMMUNITY): Payer: Self-pay

## 2021-02-12 ENCOUNTER — Emergency Department (HOSPITAL_COMMUNITY)
Admission: EM | Admit: 2021-02-12 | Discharge: 2021-02-12 | Disposition: A | Discharge status: Home / Self Care | Payer: Medicaid Other | Attending: Emergency Medicine | Admitting: Emergency Medicine

## 2021-02-12 ENCOUNTER — Other Ambulatory Visit: Payer: Self-pay

## 2021-02-12 DIAGNOSIS — R1032 Left lower quadrant pain: Secondary | ICD-10-CM | POA: Diagnosis not present

## 2021-02-12 DIAGNOSIS — R112 Nausea with vomiting, unspecified: Secondary | ICD-10-CM | POA: Insufficient documentation

## 2021-02-12 DIAGNOSIS — D649 Anemia, unspecified: Secondary | ICD-10-CM | POA: Diagnosis not present

## 2021-02-12 LAB — COMPREHENSIVE METABOLIC PANEL
ALT: 14 U/L (ref 0–44)
AST: 20 U/L (ref 15–41)
Albumin: 4.5 g/dL (ref 3.5–5.0)
Alkaline Phosphatase: 54 U/L (ref 38–126)
Anion gap: 8 (ref 5–15)
BUN: 13 mg/dL (ref 6–20)
CO2: 26 mmol/L (ref 22–32)
Calcium: 9.4 mg/dL (ref 8.9–10.3)
Chloride: 101 mmol/L (ref 98–111)
Creatinine, Ser: 0.76 mg/dL (ref 0.44–1.00)
GFR, Estimated: >60 mL/min (ref >60)
Glucose, Bld: 126 mg/dL — ABNORMAL HIGH (ref 70–99)
Potassium: 4.7 mmol/L (ref 3.5–5.1)
Sodium: 135 mmol/L (ref 135–145)
Total Bilirubin: 0.6 mg/dL (ref 0.3–1.2)
Total Protein: 8.5 g/dL — ABNORMAL HIGH (ref 6.5–8.1)

## 2021-02-12 LAB — URINALYSIS, ROUTINE W REFLEX MICROSCOPIC
Bilirubin Urine: NEGATIVE
Glucose, UA: NEGATIVE mg/dL
Hgb urine dipstick: NEGATIVE
Ketones, ur: NEGATIVE mg/dL
Leukocytes,Ua: NEGATIVE
Nitrite: NEGATIVE
Protein, ur: NEGATIVE mg/dL
Specific Gravity, Urine: 1.02 (ref 1.005–1.030)
pH: 6 (ref 5.0–8.0)

## 2021-02-12 LAB — CBC
HCT: 40 % (ref 36.0–46.0)
Hemoglobin: 12.6 g/dL (ref 12.0–15.0)
MCH: 29.9 pg (ref 26.0–34.0)
MCHC: 31.5 g/dL (ref 30.0–36.0)
MCV: 94.8 fL (ref 80.0–100.0)
Platelets: 417 10*3/uL — ABNORMAL HIGH (ref 150–400)
RBC: 4.22 MIL/uL (ref 3.87–5.11)
RDW: 13.6 % (ref 11.5–15.5)
WBC: 7.5 10*3/uL (ref 4.0–10.5)
nRBC: 0 % (ref 0.0–0.2)

## 2021-02-12 LAB — LIPASE, BLOOD: Lipase: 27 U/L (ref 11–51)

## 2021-02-12 MED ORDER — ACETAMINOPHEN 500 MG PO TABS
1000.0000 mg | ORAL_TABLET | Freq: Once | ORAL | Status: AC
  Administered 2021-02-12: 1000 mg via ORAL
  Filled 2021-02-12: qty 2

## 2021-02-12 MED ORDER — PROMETHAZINE HCL 25 MG/ML IJ SOLN
25.0000 mg | Freq: Once | INTRAMUSCULAR | Status: AC
  Administered 2021-02-12: 25 mg via INTRAVENOUS
  Filled 2021-02-12: qty 1

## 2021-02-12 MED ORDER — DICYCLOMINE HCL 10 MG PO CAPS
10.0000 mg | ORAL_CAPSULE | Freq: Once | ORAL | Status: AC
  Administered 2021-02-12: 10 mg via ORAL
  Filled 2021-02-12: qty 1

## 2021-02-12 MED ORDER — FAMOTIDINE IN NACL 20-0.9 MG/50ML-% IV SOLN
20.0000 mg | Freq: Once | INTRAVENOUS | Status: AC
  Administered 2021-02-12: 20 mg via INTRAVENOUS
  Filled 2021-02-12: qty 50

## 2021-02-12 MED ORDER — SODIUM CHLORIDE 0.9 % IV BOLUS
1000.0000 mL | Freq: Once | INTRAVENOUS | Status: AC
  Administered 2021-02-12: 1000 mL via INTRAVENOUS

## 2021-02-12 NOTE — ED Provider Notes (Signed)
[facility]-EMERGENCY DEPT Provider Note   CSN: [id number] Arrival date & time: 02/12/21  1053     History Chief Complaint  Patient presents with  . Abdominal Pain  . Emesis    Bethany [name] is a 35 y.o. female with a history of abdominal hysterectomy, C-section x4, cholecystectomy.  Patient presents with chief complaint of abdominal pain.  Patient reports abdominal pain is in her left lower quadrant.  Pain has been present for the past 2 months however has gradually worsened over the last 2 to 3 days.  Rates her pain as 9/10 on the pain scale.  Reports that pain is constant.  Pain radiates to her left flank intermittently.  Patient denies any aggravating or alleviating factors.  Patient denies any change in pain with consumption of food.  Patient endorses associated nausea and vomiting.  Patient reports that she had 4 episodes of emesis in the last 24 hours.  Patient reports seeing 5 to 10 mL of blood in her first round of emesis.  Since then emesis has been food contents.  Patient reports she has improvement in her nausea when taking prescribed Phenergan.  Patient last took her prescribed Phenergan last night.  Patient denies any fevers, chills, chest pain, shortness of breath, abdominal distention, blood in stool, diarrhea, constipation, dysuria, urinary frequency, hematuria, vaginal bleeding, vaginal pain, vaginal discharge, genital sores or lesions.    Per chart review patient has been seen on with a 20 times for this same complaint since November 2021.  She was most recently seen at [facility] ED on 3/322 for same complaint.  UA and CMP were unremarkable at this visit.  Patient's hemoglobin noted to be slight decrease at 11.4.  Review last CT abdomen pelvis obtained on 3/1 no acute abnormality; minor fat-containing umbilical hernia is noted.    HPI     Past Medical History:  Diagnosis Date  . Anemia     There are no problems to display for this  patient.   Past Surgical History:  Procedure Laterality Date  . ABDOMINAL HYSTERECTOMY    . CESAREAN SECTION     x 4  . CHOLECYSTECTOMY       OB History   No obstetric history on file.     Family History  Problem Relation Age of Onset  . Diabetes Mother   . Hypertension Mother     Social History   Tobacco Use  . Smoking status: Never Smoker  . Smokeless tobacco: Never Used  Vaping Use  . Vaping Use: Never used  Substance Use Topics  . Alcohol use: Never  . Drug use: Never    Home Medications Prior to Admission medications   Medication Sig Start Date End Date Taking? Authorizing Provider  ferrous sulfate 325 (65 FE) MG tablet Take 1 tablet by mouth in the morning and at bedtime. 06/20/20   [provider]  omeprazole (PRILOSEC) 20 MG capsule Take 1 capsule (20 mg total) by mouth daily. 01/07/21 02/06/21  Carroll Sage, PA-C  promethazine (PHENERGAN) 12.5 MG suppository Place 1 suppository (12.5 mg total) rectally every 8 (eight) hours as needed for up to 5 days for nausea or vomiting. 01/07/21 01/12/21  Carroll Sage, PA-C  promethazine (PHENERGAN) 25 MG tablet Take 25 mg by mouth every 6 (six) hours as needed for nausea or vomiting.    [provider]    Allergies    Celecoxib, Fanatrex fusepaq, and Gabapentin  Review of Systems  Review of Systems  Constitutional: Negative for chills and fever.  HENT: Negative for congestion, rhinorrhea and sore throat.   Eyes: Negative for visual disturbance.  Respiratory: Negative for cough and shortness of breath.   Cardiovascular: Negative for chest pain.  Gastrointestinal: Positive for abdominal pain, nausea and vomiting. Negative for anal bleeding, blood in stool, constipation, diarrhea and rectal pain.  Genitourinary: Positive for flank pain (left flank). Negative for decreased urine volume, difficulty urinating, dysuria, frequency, genital sores, hematuria, pelvic pain, vaginal bleeding, vaginal  discharge and vaginal pain.  Musculoskeletal: Negative for back pain and neck pain.  Skin: Negative for color change and rash.  Neurological: Negative for dizziness, syncope, light-headedness and headaches.  Psychiatric/Behavioral: Negative for confusion.    Physical Exam Updated Vital Signs BP (!) 124/95   Pulse 92   Temp 98.8 F (37.1 C) (Oral)   Resp 19   Ht 5' (1.524 m)   Wt 93.9 kg   SpO2 96%   BMI 40.43 kg/m   Physical Exam Vitals and nursing note reviewed.  Constitutional:      General: She is not in acute distress.    Appearance: She is obese. She is not ill-appearing, toxic-appearing or diaphoretic.  HENT:     Head: Normocephalic.  Eyes:     General: No scleral icterus.       Right eye: No discharge.        Left eye: No discharge.  Cardiovascular:     Rate and Rhythm: Normal rate.     Heart sounds: Normal heart sounds.  Pulmonary:     Effort: Pulmonary effort is normal. No tachypnea, bradypnea or respiratory distress.     Breath sounds: Normal breath sounds. No stridor. No decreased breath sounds, wheezing, rhonchi or rales.  Abdominal:     General: A surgical scar is present. Bowel sounds are normal. There is no distension. There are no signs of injury.     Palpations: Abdomen is soft. There is no mass or pulsatile mass.     Tenderness: There is abdominal tenderness in the left lower quadrant. There is no right CVA tenderness, left CVA tenderness, guarding or rebound. Negative signs include McBurney's sign and psoas sign.     Hernia: There is no hernia in the umbilical area or ventral area.  Musculoskeletal:     Cervical back: Neck supple.  Skin:    General: Skin is warm and dry.     Coloration: Skin is not jaundiced or pale.  Neurological:     General: No focal deficit present.     Mental Status: She is alert.  Psychiatric:        Behavior: Behavior is cooperative.     ED Results / Procedures / Treatments   Labs (all labs ordered are listed, but  only abnormal results are displayed) Labs Reviewed  COMPREHENSIVE METABOLIC PANEL - Abnormal; Notable for the following components:      Result Value   Glucose, Bld 126 (*)    Total Protein 8.5 (*)    All other components within normal limits  CBC - Abnormal; Notable for the following components:   Platelets 417 (*)    All other components within normal limits  URINALYSIS, ROUTINE W REFLEX MICROSCOPIC - Abnormal; Notable for the following components:   APPearance HAZY (*)    All other components within normal limits  LIPASE, BLOOD    EKG None  Radiology No results found.  Procedures Procedures   Medications Ordered in ED Medications  sodium chloride 0.9 % bolus 1,000 mL (0 mLs Intravenous Stopped 02/12/21 1431)  dicyclomine (BENTYL) capsule 10 mg (10 mg Oral Given 02/12/21 1248)  acetaminophen (TYLENOL) tablet 1,000 mg (1,000 mg Oral Given 02/12/21 1248)  famotidine (PEPCID) IVPB 20 mg premix (0 mg Intravenous Stopped 02/12/21 1316)  promethazine (PHENERGAN) injection 25 mg (25 mg Intravenous Given 02/12/21 1247)    ED Course  I have reviewed the triage vital signs and the nursing notes.  Pertinent labs & imaging results that were available during my care of the patient were reviewed by me and considered in my medical decision making (see chart for details).    MDM Rules/Calculators/A&P                          Alert 35 year old female no distress, nontoxic-appearing.  Patient presents with complaint of left lower quadrant abdominal pain.  Pain has been present for the last 2 months.  Patient reports that pain has gradually worsened over the last 2 to 3 days.  Per chart review patient has been seen multiple times for this same complaint in various emergency departments as well as with her gastroenterologist over the last 5 months.  Most recently patient was seen at [facility] on 3/3.   UA and CMP were unremarkable at this visit.  Patient's hemoglobin  noted to be slight decrease at 11.4.  Patient was also seen at [facility] on 3/1; a CT abdomen pelvis was obtained and showed no acute abnormality; minor fat-containing umbilical hernia is noted.  On physical exam today abdomen has normal active bowel sounds, is soft nondistended, tenderness to left lower quadrant.  No mass, pulsatile mass, ventral or umbilical hernia observed.  We will give patient 1 L normal saline fluid bolus, Pepcid, Phenergan, Bentyl, and Tylenol.  UA, CMP, CBC, lipase ordered.  No suspicion for intrauterine pregnancy as patient has no uterus.  Low suspicion for ectopic pregnancy due to patient's history of hysterectomy and recent abdominal CT scan unremarkable. UA shows no signs of infection or dehydration. Lipase is within normal limits; low suspicion for acute pancreatitis. Low suspicion for acute appendicitis as patient has no tenderness to McBurney's point, negative psoas sign, no leukocytosis, patient afebrile, no acute findings on recent CT abdomen pelvis.   On Reassessment abdomen soft, nondistended.  Patient reports that she vomited after receiving oral Tylenol and Bentyl.  Patient denies any blood in his emesis.  Patient was given explanation that her lab result were unremarkable.  Patient asked to be discharged after hearing this information.  Patient states that she will see her GI on 02/25/21.  Patient has prescription for Phenergan oral and suppository at home.  Patient advised to use this medication as needed.  Patient advised to eat a bland diet.  Patient encouraged to increase her oral fluid intake.  Discussed results, findings, treatment and follow up. Patient advised of return precautions. Patient verbalized understanding and agreed with plan.   Final Clinical Impression(s) / ED Diagnoses Final diagnoses:  Non-intractable vomiting with nausea, unspecified vomiting type  Left lower quadrant abdominal pain    Rx / DC Orders ED  Discharge Orders    None       Berneice Heinrich 02/12/21 2359    Lorre Nick, MD 02/16/21 1027

## 2021-02-12 NOTE — Discharge Instructions (Addendum)
You came to the emergency department today to be evaluated for your chronic abdominal pain, nausea, and vomiting.  Your physical exam and lab work were reassuring.  There is no acute emergency at this time.  Please follow-up with your GI doctor for further assessment and work-up.  These take your prescribed Phenergan with your nausea vomiting.  Get help right away if: Your pain does not go away as soon as your health care provider told you to expect. You cannot stop vomiting. Your pain is only in areas of the abdomen, such as the right side or the left lower portion of the abdomen. Pain on the right side could be caused by appendicitis. You have bloody or black stools, or stools that look like tar. You have severe pain, cramping, or bloating in your abdomen. You have signs of dehydration, such as: Dark urine, very little urine, or no urine. Cracked lips. Dry mouth. Sunken eyes. Sleepiness. Weakness. You have trouble breathing or chest pain.

## 2021-02-12 NOTE — ED Triage Notes (Signed)
Patient c/o left lower abdominal pain x 1 1/2 months and N/V x 2 days

## 2021-02-23 ENCOUNTER — Encounter (HOSPITAL_BASED_OUTPATIENT_CLINIC_OR_DEPARTMENT_OTHER): Payer: Self-pay

## 2021-02-23 ENCOUNTER — Emergency Department (HOSPITAL_BASED_OUTPATIENT_CLINIC_OR_DEPARTMENT_OTHER)
Admission: EM | Admit: 2021-02-23 | Discharge: 2021-02-23 | Disposition: A | Discharge status: Home / Self Care | Payer: Medicaid Other | Attending: Emergency Medicine | Admitting: Emergency Medicine

## 2021-02-23 ENCOUNTER — Other Ambulatory Visit: Payer: Self-pay

## 2021-02-23 DIAGNOSIS — G8929 Other chronic pain: Secondary | ICD-10-CM | POA: Insufficient documentation

## 2021-02-23 DIAGNOSIS — K920 Hematemesis: Secondary | ICD-10-CM | POA: Insufficient documentation

## 2021-02-23 DIAGNOSIS — R1032 Left lower quadrant pain: Secondary | ICD-10-CM | POA: Insufficient documentation

## 2021-02-23 LAB — COMPREHENSIVE METABOLIC PANEL
ALT: 21 U/L (ref 0–44)
AST: 15 U/L (ref 15–41)
Albumin: 4.1 g/dL (ref 3.5–5.0)
Alkaline Phosphatase: 50 U/L (ref 38–126)
Anion gap: 9 (ref 5–15)
BUN: 12 mg/dL (ref 6–20)
CO2: 25 mmol/L (ref 22–32)
Calcium: 8.9 mg/dL (ref 8.9–10.3)
Chloride: 102 mmol/L (ref 98–111)
Creatinine, Ser: 0.75 mg/dL (ref 0.44–1.00)
GFR, Estimated: >60 mL/min (ref >60)
Glucose, Bld: 93 mg/dL (ref 70–99)
Potassium: 4.1 mmol/L (ref 3.5–5.1)
Sodium: 136 mmol/L (ref 135–145)
Total Bilirubin: 0.2 mg/dL — ABNORMAL LOW (ref 0.3–1.2)
Total Protein: 8.1 g/dL (ref 6.5–8.1)

## 2021-02-23 LAB — CBC
HCT: 37.7 % (ref 36.0–46.0)
Hemoglobin: 12.2 g/dL (ref 12.0–15.0)
MCH: 30 pg (ref 26.0–34.0)
MCHC: 32.4 g/dL (ref 30.0–36.0)
MCV: 92.9 fL (ref 80.0–100.0)
Platelets: 471 10*3/uL — ABNORMAL HIGH (ref 150–400)
RBC: 4.06 MIL/uL (ref 3.87–5.11)
RDW: 13.2 % (ref 11.5–15.5)
WBC: 6.3 10*3/uL (ref 4.0–10.5)
nRBC: 0 % (ref 0.0–0.2)

## 2021-02-23 LAB — URINALYSIS, ROUTINE W REFLEX MICROSCOPIC
Bilirubin Urine: NEGATIVE
Glucose, UA: NEGATIVE mg/dL
Hgb urine dipstick: NEGATIVE
Ketones, ur: NEGATIVE mg/dL
Nitrite: NEGATIVE
Protein, ur: NEGATIVE mg/dL
Specific Gravity, Urine: 1.02 (ref 1.005–1.030)
pH: 7 (ref 5.0–8.0)

## 2021-02-23 LAB — URINALYSIS, MICROSCOPIC (REFLEX): RBC / HPF: NONE SEEN RBC/hpf (ref 0–5)

## 2021-02-23 LAB — PREGNANCY, URINE: Preg Test, Ur: NEGATIVE

## 2021-02-23 LAB — LIPASE, BLOOD: Lipase: 26 U/L (ref 11–51)

## 2021-02-23 MED ORDER — MORPHINE SULFATE (PF) 4 MG/ML IV SOLN
4.0000 mg | Freq: Once | INTRAVENOUS | Status: AC
  Administered 2021-02-23: 4 mg via INTRAVENOUS
  Filled 2021-02-23: qty 1

## 2021-02-23 MED ORDER — SODIUM CHLORIDE 0.9 % IV BOLUS
1000.0000 mL | Freq: Once | INTRAVENOUS | Status: AC
  Administered 2021-02-23: 1000 mL via INTRAVENOUS

## 2021-02-23 MED ORDER — PANTOPRAZOLE SODIUM 40 MG IV SOLR
40.0000 mg | Freq: Once | INTRAVENOUS | Status: AC
  Administered 2021-02-23: 40 mg via INTRAVENOUS
  Filled 2021-02-23: qty 40

## 2021-02-23 MED ORDER — PROMETHAZINE HCL 25 MG/ML IJ SOLN
INTRAMUSCULAR | Status: AC
  Filled 2021-02-23: qty 1

## 2021-02-23 MED ORDER — PROMETHAZINE HCL 25 MG/ML IJ SOLN: 25.0000 mg | Freq: Once | INTRAMUSCULAR | Status: DC

## 2021-02-23 MED ORDER — SODIUM CHLORIDE 0.9 % IV SOLN
12.5000 mg | Freq: Once | INTRAVENOUS | Status: AC
  Administered 2021-02-23: 12.5 mg via INTRAVENOUS
  Filled 2021-02-23: qty 0.5

## 2021-02-23 NOTE — ED Provider Notes (Signed)
[facility] EMERGENCY DEPARTMENT Provider Note   CSN: [id number] Arrival date & time: 02/23/21  1139     History Chief Complaint  Patient presents with  . Hematemesis    Bethany [name] is a 35 y.o. female with pertinent past medical history of anemia secondary to GI bleeding, enteritis of the small bowel and IDA who presents the emergency department today for abdominal pain.  Patient states that she has left lower quadrant abdominal pain, is no worse than before has been present for multiple months.  Also admits to hematemesis.  Patient states she had 3 episodes yesterday and one episode today.  Patient states that she has had episodes of hematemesis rarely for the past couple of months.  Per previous provider's note last 2 weeks, patient has been seen 20 times in the emergency department today for evaluation of left lower quadrant pain.  Patient has been seen by GI doctor, Dr. Lennice Sites by [facility] for abdominal pain.  Has had negative CT scans, last one was done 2 weeks ago.  They also did a pelvic ultrasound which did not show any acute findings.  Patient also had an EGD done in October 2021 that showed a normal esophagus, stomach and duodenum.  Had a colonoscopy last summer that showed internal hemorrhoids.  Last time she saw Dr. Lennice Sites was 2 months ago, he believed that patient most likely has IBS.  Dr. Lennice Sites was aware about the hematemesis.  Patient denies any hematochezia or melena.  Last time patient was seen in the ED at [facility] she was treated for suspected PID, finished her course of antibiotics, patient denies any pelvic pain vaginal complaints.  States that her gonorrhea and chlamydia came back negative.  Denies any dysuria or hematuria.  HPI     Past Medical History:  Diagnosis Date  . Anemia     There are no problems to display for this patient.   Past Surgical History:  Procedure Laterality Date  . ABDOMINAL HYSTERECTOMY    . CESAREAN SECTION     x 4  .  CHOLECYSTECTOMY       OB History   No obstetric history on file.     Family History  Problem Relation Age of Onset  . Diabetes Mother   . Hypertension Mother     Social History   Tobacco Use  . Smoking status: Never Smoker  . Smokeless tobacco: Never Used  Vaping Use  . Vaping Use: Never used  Substance Use Topics  . Alcohol use: Never  . Drug use: Never    Home Medications Prior to Admission medications   Medication Sig Start Date End Date Taking? Authorizing Provider  ferrous sulfate 325 (65 FE) MG tablet Take 325 mg by mouth 3 (three) times daily with meals. 06/20/20  Yes [provider]  pantoprazole (PROTONIX) 40 MG tablet Take 40 mg by mouth 2 (two) times daily.   Yes [provider]  promethazine (PHENERGAN) 25 MG tablet Take 25 mg by mouth every 6 (six) hours as needed for nausea or vomiting.   Yes [provider]  acetaminophen (TYLENOL) 500 MG tablet Take 1,000 mg by mouth every 6 (six) hours as needed for mild pain.    [provider]  omeprazole (PRILOSEC) 20 MG capsule Take 1 capsule (20 mg total) by mouth daily. Patient not taking: Reported on 02/12/2021 01/07/21 02/06/21  Carroll Sage, PA-C  promethazine (PHENERGAN) 12.5 MG suppository Place 1 suppository (12.5 mg total) rectally every 8 (  eight) hours as needed for up to 5 days for nausea or vomiting. Patient not taking: Reported on 02/12/2021 01/07/21 01/12/21  Carroll Sage, PA-C    Allergies    Celecoxib, Fanatrex fusepaq, and Gabapentin  Review of Systems   Review of Systems  Constitutional: Negative for chills, diaphoresis, fatigue and fever.  HENT: Negative for congestion, sore throat and trouble swallowing.   Eyes: Negative for pain and visual disturbance.  Respiratory: Negative for cough, shortness of breath and wheezing.   Cardiovascular: Negative for chest pain, palpitations and leg swelling.  Gastrointestinal: Positive for abdominal pain, nausea and  vomiting. Negative for abdominal distention and diarrhea.  Genitourinary: Negative for difficulty urinating.  Musculoskeletal: Negative for back pain, neck pain and neck stiffness.  Skin: Negative for pallor.  Neurological: Negative for dizziness, speech difficulty, weakness and headaches.  Psychiatric/Behavioral: Negative for confusion.    Physical Exam Updated Vital Signs BP (!) 126/99 (BP Location: Left Arm)   Pulse 76   Temp 98.6 F (37 C) (Oral)   Resp 18   Ht 5' (1.524 m)   Wt 93.9 kg   SpO2 100%   BMI 40.43 kg/m   Physical Exam Constitutional:      General: She is not in acute distress.    Appearance: Normal appearance. She is not ill-appearing, toxic-appearing or diaphoretic.  HENT:     Mouth/Throat:     Mouth: Mucous membranes are moist.     Pharynx: Oropharynx is clear.  Eyes:     General: No scleral icterus.    Extraocular Movements: Extraocular movements intact.     Pupils: Pupils are equal, round, and reactive to light.  Cardiovascular:     Rate and Rhythm: Normal rate and regular rhythm.     Pulses: Normal pulses.     Heart sounds: Normal heart sounds.  Pulmonary:     Effort: Pulmonary effort is normal. No respiratory distress.     Breath sounds: Normal breath sounds. No stridor. No wheezing, rhonchi or rales.  Chest:     Chest wall: No tenderness.  Abdominal:     General: Abdomen is flat. There is no distension.     Palpations: Abdomen is soft.     Tenderness: There is no abdominal tenderness. There is no guarding or rebound.     Comments: No abdominal tenderness on exam.  Musculoskeletal:        General: No swelling or tenderness. Normal range of motion.     Cervical back: Normal range of motion and neck supple. No rigidity.     Right lower leg: No edema.     Left lower leg: No edema.  Skin:    General: Skin is warm and dry.     Capillary Refill: Capillary refill takes less than 2 seconds.     Coloration: Skin is not pale.  Neurological:      General: No focal deficit present.     Mental Status: She is alert and oriented to person, place, and time.  Psychiatric:        Mood and Affect: Mood normal.        Behavior: Behavior normal.     ED Results / Procedures / Treatments   Labs (all labs ordered are listed, but only abnormal results are displayed) Labs Reviewed  COMPREHENSIVE METABOLIC PANEL - Abnormal; Notable for the following components:      Result Value   Total Bilirubin 0.2 (*)    All other components within normal limits  CBC -  Abnormal; Notable for the following components:   Platelets 471 (*)    All other components within normal limits  URINALYSIS, ROUTINE W REFLEX MICROSCOPIC - Abnormal; Notable for the following components:   Leukocytes,Ua SMALL (*)    All other components within normal limits  URINALYSIS, MICROSCOPIC (REFLEX) - Abnormal; Notable for the following components:   Bacteria, UA RARE (*)    All other components within normal limits  LIPASE, BLOOD  PREGNANCY, URINE    EKG None  Radiology No results found.  Procedures Procedures   Medications Ordered in ED Medications  sodium chloride 0.9 % bolus 1,000 mL (0 mLs Intravenous Stopped 02/23/21 1615)  pantoprazole (PROTONIX) injection 40 mg (40 mg Intravenous Given 02/23/21 1503)  morphine 4 MG/ML injection 4 mg (4 mg Intravenous Given 02/23/21 1501)  promethazine (PHENERGAN) 12.5 mg in sodium chloride 0.9 % 50 mL IVPB (0 mg Intravenous Stopped 02/23/21 1526)    ED Course  I have reviewed the triage vital signs and the nursing notes.  Pertinent labs & imaging results that were available during my care of the patient were reviewed by me and considered in my medical decision making (see chart for details).    MDM Rules/Calculators/A&P                          Bethany [name] is a 35 y.o. female with pertinent past medical history of anemia secondary to GI bleeding, enteritis of the small bowel and IDA who presents the emergency  department today for abdominal pain and a couple episodes of hematemesis yesterday and one episode this morning.  Patient is hemodynamically stable.  Not vomiting currently.  Patient is having left lower quadrant pain which is pretty chronic.  Per chart review, GI doctor thinks this is most likely related to IBS.  Shared decision-making about repeat imaging, we decided to not necessary at the time since exam has not changed, patient has no guarding on exam with no surgical abdomen.  Patient has had hematemesis before, has a negative endoscopy according to GI doctor. Normal vitals.   Work-up today unremarkable, no leukocytosis, hemoglobin stable at 12.2.  Urinalysis does show some small leukocytes with some budding yeast, did discuss with patient about wet prep versus vaginal exam, patient does not want a procedure this, is not having any symptoms.  Upon reevaluation with fluids, Phenergan, Protonix and morphine patient states that she feels better.  Patient will follow up with a GI doctor, has been here for 5 hours and has not had any episodes of vomiting.  Passed p.o. challenge.  Patient to be discharged at this time.  I discussed this case with my attending physician who cosigned this note including patient's presenting symptoms, physical exam, and planned diagnostics and interventions. Attending physician stated agreement with plan or made changes to plan which were implemented.    Final Clinical Impression(s) / ED Diagnoses Final diagnoses:  Chronic LLQ pain  Hematemesis with nausea    Rx / DC Orders ED Discharge Orders    None       Farrel Gordon, PA-C 02/23/21 1701    Benjiman Core, MD 02/23/21 2306

## 2021-02-23 NOTE — ED Notes (Signed)
Pt ambulatory to restroom to obtain urine sample. States nausea has not yet improved.

## 2021-02-23 NOTE — ED Notes (Signed)
Pt tolerating water without difficulty.

## 2021-02-23 NOTE — ED Notes (Signed)
Pt lying down for for othostatics

## 2021-02-23 NOTE — ED Triage Notes (Addendum)
Pt has LLQ abdominal pain x 74months. States started vomiting bright red blood yesterday. Unable to tolerate PO.  States has seen GI over the past several months. Hx of anemia

## 2021-02-23 NOTE — ED Notes (Signed)
Pt provided with water for PO challenge.

## 2021-02-23 NOTE — Discharge Instructions (Addendum)
Your work-up today was reassuring, as we discussed you need to follow-up with your GI doctor.  Please schedule an appointment with them as soon as you can. Your blood pressure was also elevated today in the emergency department, please keep an eye on this and schedule appointment with your PCP about rechecking this.  Please use the attached instructions, if you have any new or worsening concerning symptoms he can back to the emergency department.  Please stay hydrated, if you are continuously vomiting blood then you need to come back to the emergency department.  Get help right away if: You faint. You feel weak or dizzy. You are urinating less than normal or not at all. You vomit up: Large amounts of blood or dark material that may look like coffee grounds. Bright red blood. You have any of the following: Persistent vomiting. A rapid heartbeat. Blood in your stool. Chest pain. Difficulty breathing.

## 2021-03-03 ENCOUNTER — Other Ambulatory Visit: Payer: Self-pay

## 2021-03-03 ENCOUNTER — Emergency Department (HOSPITAL_COMMUNITY)
Admission: EM | Admit: 2021-03-03 | Discharge: 2021-03-03 | Disposition: A | Discharge status: Home / Self Care | Payer: Medicaid Other | Attending: Emergency Medicine | Admitting: Emergency Medicine

## 2021-03-03 ENCOUNTER — Encounter (HOSPITAL_COMMUNITY): Payer: Self-pay | Admitting: *Deleted

## 2021-03-03 ENCOUNTER — Emergency Department (HOSPITAL_COMMUNITY): Payer: Medicaid Other

## 2021-03-03 DIAGNOSIS — R112 Nausea with vomiting, unspecified: Secondary | ICD-10-CM | POA: Diagnosis not present

## 2021-03-03 DIAGNOSIS — R1032 Left lower quadrant pain: Secondary | ICD-10-CM

## 2021-03-03 DIAGNOSIS — K5900 Constipation, unspecified: Secondary | ICD-10-CM | POA: Insufficient documentation

## 2021-03-03 DIAGNOSIS — R197 Diarrhea, unspecified: Secondary | ICD-10-CM | POA: Insufficient documentation

## 2021-03-03 LAB — CBC WITH DIFFERENTIAL/PLATELET
Abs Immature Granulocytes: 0.02 10*3/uL (ref 0.00–0.07)
Basophils Absolute: 0.1 10*3/uL (ref 0.0–0.1)
Basophils Relative: 1 %
Eosinophils Absolute: 0.1 10*3/uL (ref 0.0–0.5)
Eosinophils Relative: 1 %
HCT: 38.5 % (ref 36.0–46.0)
Hemoglobin: 12 g/dL (ref 12.0–15.0)
Immature Granulocytes: 0 %
Lymphocytes Relative: 30 %
Lymphs Abs: 1.7 10*3/uL (ref 0.7–4.0)
MCH: 29.6 pg (ref 26.0–34.0)
MCHC: 31.2 g/dL (ref 30.0–36.0)
MCV: 94.8 fL (ref 80.0–100.0)
Monocytes Absolute: 0.3 10*3/uL (ref 0.1–1.0)
Monocytes Relative: 6 %
Neutro Abs: 3.5 10*3/uL (ref 1.7–7.7)
Neutrophils Relative %: 62 %
Platelets: 453 10*3/uL — ABNORMAL HIGH (ref 150–400)
RBC: 4.06 MIL/uL (ref 3.87–5.11)
RDW: 13.5 % (ref 11.5–15.5)
WBC: 5.6 10*3/uL (ref 4.0–10.5)
nRBC: 0 % (ref 0.0–0.2)

## 2021-03-03 LAB — COMPREHENSIVE METABOLIC PANEL
ALT: 31 U/L (ref 0–44)
AST: 19 U/L (ref 15–41)
Albumin: 4.1 g/dL (ref 3.5–5.0)
Alkaline Phosphatase: 53 U/L (ref 38–126)
Anion gap: 9 (ref 5–15)
BUN: 14 mg/dL (ref 6–20)
CO2: 24 mmol/L (ref 22–32)
Calcium: 9.2 mg/dL (ref 8.9–10.3)
Chloride: 103 mmol/L (ref 98–111)
Creatinine, Ser: 0.75 mg/dL (ref 0.44–1.00)
GFR, Estimated: >60 mL/min (ref >60)
Glucose, Bld: 119 mg/dL — ABNORMAL HIGH (ref 70–99)
Potassium: 3.9 mmol/L (ref 3.5–5.1)
Sodium: 136 mmol/L (ref 135–145)
Total Bilirubin: 0.4 mg/dL (ref 0.3–1.2)
Total Protein: 7.7 g/dL (ref 6.5–8.1)

## 2021-03-03 LAB — URINALYSIS, ROUTINE W REFLEX MICROSCOPIC
Bilirubin Urine: NEGATIVE
Glucose, UA: NEGATIVE mg/dL
Hgb urine dipstick: NEGATIVE
Ketones, ur: NEGATIVE mg/dL
Leukocytes,Ua: NEGATIVE
Nitrite: NEGATIVE
Protein, ur: NEGATIVE mg/dL
Specific Gravity, Urine: 1.017 (ref 1.005–1.030)
pH: 6 (ref 5.0–8.0)

## 2021-03-03 LAB — POC OCCULT BLOOD, ED

## 2021-03-03 LAB — LIPASE, BLOOD: Lipase: 30 U/L (ref 11–51)

## 2021-03-03 MED ORDER — SODIUM CHLORIDE 0.9 % IV SOLN: 12.5000 mg | Freq: Once | INTRAVENOUS | Status: DC

## 2021-03-03 MED ORDER — SODIUM CHLORIDE 0.9 % IV SOLN
12.5000 mg | Freq: Once | INTRAVENOUS | Status: DC
  Filled 2021-03-03: qty 0.5

## 2021-03-03 MED ORDER — PROMETHAZINE HCL 25 MG/ML IJ SOLN
12.5000 mg | Freq: Once | INTRAMUSCULAR | Status: AC
  Administered 2021-03-03: 12.5 mg via INTRAMUSCULAR
  Filled 2021-03-03: qty 1

## 2021-03-03 MED ORDER — PANTOPRAZOLE SODIUM 40 MG IV SOLR
40.0000 mg | Freq: Once | INTRAVENOUS | Status: AC
  Administered 2021-03-03: 40 mg via INTRAVENOUS
  Filled 2021-03-03: qty 40

## 2021-03-03 MED ORDER — SODIUM CHLORIDE 0.9 % IV BOLUS
1000.0000 mL | Freq: Once | INTRAVENOUS | Status: AC
  Administered 2021-03-03: 1000 mL via INTRAVENOUS

## 2021-03-03 MED ORDER — IOHEXOL 300 MG/ML  SOLN
100.0000 mL | Freq: Once | INTRAMUSCULAR | Status: AC | PRN
  Administered 2021-03-03: 100 mL via INTRAVENOUS

## 2021-03-03 MED ORDER — MORPHINE SULFATE (PF) 4 MG/ML IV SOLN
4.0000 mg | Freq: Once | INTRAVENOUS | Status: AC
  Administered 2021-03-03: 4 mg via INTRAVENOUS
  Filled 2021-03-03: qty 1

## 2021-03-03 NOTE — ED Provider Notes (Signed)
[facility] EMERGENCY DEPARTMENT Provider Note   CSN: [id number] Arrival date & time: 03/03/21  1214     History Chief Complaint  Patient presents with  . Abdominal Pain    Bethany [name] is a 35 y.o. female with a history of anemia, abdominal hysterectomy, C-section x4, cholecystectomy.  Presents with chief complaint of left-sided abdominal pain.  Patient has a history of abdominal pain and has been seen multiple times in the emergency department for the same complaint.  Patient states that yesterday her abdominal pain became more severe.  Pain is primarily on the left side of her abdomen but she complains of pain throughout her abdomen.  She denies any radiation of her pain.  Patient describes the pain as sharp.  Patient denies any radiation of her pain.  Pain is worse with eating.  No alleviating factors.    Patient endorses fever, nausea, vomiting, melena and diarrhea.  Patient reports yesterday she had an episode of hematemesis.  Patient reports that she has had previous episodes of hematemesis that her GI provider is aware.  Patient reports vomiting 4 times in the last 24 hours.  Patient reports minimal amount of bright red blood seen in her emesis this morning.  Reports melena and diarrhea x2 days.  Patient reports she had a fever yesterday 100.8 F orally.    Per chart review patient was seen by her GI provider Dr. Lennice SitesPope with [facility] on 02/25/21.  Dr. Lennice SitesPope was made aware of multiple ED visits, reviewed previous CT scans.  Patient was started on eval 50 mg, lenses continue daily abdominal bowel purge to relieve constipation and colonoscopy scheduled for 03/23/21.    Patient endorses patient reports that she has been taking oral medications prescribed by her GI provider with no relief.    Patient denies any URI symptoms, shortness of breath, chest pain, abdominal distention, blood in stool, constipation, GU complaints, lightheadedness, dizziness, syncopal episodes.  HPI     Past  Medical History:  Diagnosis Date  . Anemia     There are no problems to display for this patient.   Past Surgical History:  Procedure Laterality Date  . ABDOMINAL HYSTERECTOMY    . CESAREAN SECTION     x 4  . CHOLECYSTECTOMY       OB History   No obstetric history on file.     Family History  Problem Relation Age of Onset  . Diabetes Mother   . Hypertension Mother     Social History   Tobacco Use  . Smoking status: Never Smoker  . Smokeless tobacco: Never Used  Vaping Use  . Vaping Use: Never used  Substance Use Topics  . Alcohol use: Never  . Drug use: Never    Home Medications Prior to Admission medications   Medication Sig Start Date End Date Taking? Authorizing Provider  acetaminophen (TYLENOL) 500 MG tablet Take 1,000 mg by mouth every 6 (six) hours as needed for mild pain.    [provider]  ferrous sulfate 325 (65 FE) MG tablet Take 325 mg by mouth 3 (three) times daily with meals. 06/20/20   [provider]  omeprazole (PRILOSEC) 20 MG capsule Take 1 capsule (20 mg total) by mouth daily. Patient not taking: Reported on 02/12/2021 01/07/21 02/06/21  Carroll SageFaulkner, William J, PA-C  pantoprazole (PROTONIX) 40 MG tablet Take 40 mg by mouth 2 (two) times daily.    [provider]  promethazine (PHENERGAN) 12.5 MG suppository Place 1 suppository (12.5 mg  total) rectally every 8 (eight) hours as needed for up to 5 days for nausea or vomiting. Patient not taking: Reported on 02/12/2021 01/07/21 01/12/21  Carroll Sage, PA-C  promethazine (PHENERGAN) 25 MG tablet Take 25 mg by mouth every 6 (six) hours as needed for nausea or vomiting.    [provider]    Allergies    Celecoxib, Fanatrex fusepaq, and Gabapentin  Review of Systems   Review of Systems  Constitutional: Negative for chills and fever.  HENT: Negative for congestion, rhinorrhea and sore throat.   Eyes: Negative for visual disturbance.  Respiratory: Negative for cough  and shortness of breath.   Cardiovascular: Negative for chest pain, palpitations and leg swelling.  Gastrointestinal: Positive for abdominal pain, diarrhea, nausea and vomiting. Negative for abdominal distention, anal bleeding, blood in stool, constipation and rectal pain.  Genitourinary: Negative for decreased urine volume, difficulty urinating, dysuria, flank pain, frequency, genital sores, hematuria, menstrual problem, pelvic pain, urgency, vaginal bleeding, vaginal discharge and vaginal pain.  Musculoskeletal: Negative for back pain and neck pain.  Skin: Negative for color change and rash.  Neurological: Negative for dizziness, syncope, light-headedness and headaches.  Psychiatric/Behavioral: Negative for confusion.    Physical Exam Updated Vital Signs BP (!) 145/103   Pulse 100   Temp 98.2 F (36.8 C) (Oral)   Resp 18   Ht 5' (1.524 m)   Wt 93.9 kg   SpO2 100%   BMI 40.43 kg/m   Physical Exam Vitals and nursing note reviewed. Exam conducted with a chaperone present (Female RN present as chaperone).  Constitutional:      General: She is not in acute distress.    Appearance: She is morbidly obese. She is not ill-appearing, toxic-appearing or diaphoretic.  HENT:     Head: Normocephalic and atraumatic.  Eyes:     General: No scleral icterus.       Right eye: No discharge.        Left eye: No discharge.  Cardiovascular:     Rate and Rhythm: Normal rate.     Heart sounds: Normal heart sounds.  Pulmonary:     Effort: Pulmonary effort is normal. No tachypnea, bradypnea or respiratory distress.     Breath sounds: Normal breath sounds. No stridor. No wheezing, rhonchi or rales.  Abdominal:     General: Abdomen is protuberant. Bowel sounds are normal. There is no distension. There are no signs of injury.     Palpations: Abdomen is soft. There is no mass or pulsatile mass.     Tenderness: There is generalized abdominal tenderness and tenderness in the left upper quadrant and left  lower quadrant. There is no guarding or rebound.     Hernia: There is no hernia in the umbilical area or ventral area.     Comments: Abdominal exam hindered by patient's body habitus  Generalized tenderness throughout abdomen however worse in left upper and lower quadrant  Genitourinary:    Rectum: Guaiac result negative. No mass, tenderness, anal fissure, external hemorrhoid or internal hemorrhoid. Normal anal tone.     Comments: No frank red blood or melena noted on gloved hand after rectal exam Musculoskeletal:     Cervical back: Neck supple.  Skin:    General: Skin is warm and dry.     Coloration: Skin is not jaundiced or pale.  Neurological:     General: No focal deficit present.     Mental Status: She is alert.     GCS: GCS eye  subscore is 4. GCS verbal subscore is 5. GCS motor subscore is 6.  Psychiatric:        Behavior: Behavior is cooperative.     ED Results / Procedures / Treatments   Labs (all labs ordered are listed, but only abnormal results are displayed) Labs Reviewed  CBC WITH DIFFERENTIAL/PLATELET - Abnormal; Notable for the following components:      Result Value   Platelets 453 (*)    All other components within normal limits  COMPREHENSIVE METABOLIC PANEL - Abnormal; Notable for the following components:   Glucose, Bld 119 (*)    All other components within normal limits  POC OCCULT BLOOD, ED - Normal  LIPASE, BLOOD  URINALYSIS, ROUTINE W REFLEX MICROSCOPIC    EKG None  Radiology CT Abdomen Pelvis W Contrast  Result Date: 03/03/2021 CLINICAL DATA:  Abdominal pain for 3 months, worsening today, left upper quadrant pain EXAM: CT ABDOMEN AND PELVIS WITH CONTRAST TECHNIQUE: Multidetector CT imaging of the abdomen and pelvis was performed using the standard protocol following bolus administration of intravenous contrast. CONTRAST:  OMNIPAQUE IOHEXOL 300 MG/ML  SOLN COMPARISON:  02/24/2021 FINDINGS: Lower chest: No acute pleural or parenchymal lung  disease. Hepatobiliary: No focal liver abnormality is seen. Status post cholecystectomy. No biliary dilatation. Pancreas: Unremarkable. No pancreatic ductal dilatation or surrounding inflammatory changes. Spleen: Normal in size without focal abnormality. Adrenals/Urinary Tract: Adrenal glands are unremarkable. Kidneys are normal, without renal calculi, focal lesion, or hydronephrosis. Bladder is unremarkable. Stomach/Bowel: No bowel obstruction or ileus. Moderate stool throughout the colon consistent with constipation. Normal appendix right lower quadrant. No bowel wall thickening or inflammatory change. Vascular/Lymphatic: No significant vascular findings are present. No enlarged abdominal or pelvic lymph nodes. Reproductive: Status post hysterectomy. No adnexal masses. Other: No free fluid or free gas. Small fat containing umbilical hernia unchanged. Musculoskeletal: No acute or destructive bony lesions. Reconstructed images demonstrate no additional findings. IMPRESSION: 1. No acute intra-abdominal or intrapelvic process. 2. Moderate fecal retention consistent with constipation. Electronically Signed   By: Sharlet Salina M.D.   On: 03/03/2021 15:29    Procedures Procedures   Medications Ordered in ED Medications  pantoprazole (PROTONIX) injection 40 mg (40 mg Intravenous Given 03/03/21 1321)  sodium chloride 0.9 % bolus 1,000 mL (0 mLs Intravenous Stopped 03/03/21 1503)  morphine 4 MG/ML injection 4 mg (4 mg Intravenous Given 03/03/21 1321)  promethazine (PHENERGAN) injection 12.5 mg (12.5 mg Intramuscular Given 03/03/21 1333)  iohexol (OMNIPAQUE) 300 MG/ML solution 100 mL (100 mLs Intravenous Contrast Given 03/03/21 1448)    ED Course  I have reviewed the triage vital signs and the nursing notes.  Pertinent labs & imaging results that were available during my care of the patient were reviewed by me and considered in my medical decision making (see chart for details).    MDM  Rules/Calculators/A&P                          Upon entering room alert 35 year old female is noted to be sitting upright on the edge of hospital bed in no acute distress, nontoxic-appearing.  Patient presents with chief complaint of left-sided abdominal pain.  Patient has a history of left-sided abdominal pain.  Patient reports that her pain today is more severe than it has been.  Patient endorses one episode of hematemesis yesterday as well as melena.  Patient endorses fever T-max 100.8 F orally yesterday.    Per chart review patient was seen  by her GI provider Dr. Lennice Sites with [facility] on 02/25/21.    Patient abdominal exam is hindered by her body habitus.  Normoactive bowel sounds, abdomen soft, nondistended, generalized tenderness throughout abdomen with increased tenderness to left lower quadrant and left upper quadrant.  No mass, pulsatile mass, ventral hernia, umbilical hernia.  No anal fissure or hemorrhoids, Hemoccult negative, no blood or melena noted to gloved hand after rectal exam  Will order urinalysis, CBC, CMP, lipase.  We will give patient 1 L fluid bolus, Protonix, morphine 4 mg, and Phenergan for her symptoms.  Lipase within normal limits low suspicion for acute pancreatitis. CMP shows glucose slightly elevated at 119 all other values within normal limits. UA shows no signs of infection. No leukocytosis or anemia noted on CBC.  Platelets slightly increased at 453.  All other values within normal limits.  Shared decision-making was used in regards to abdominal CT scan.  Discussed with patient the risks and benefits of performing a CT scan.  Patient reports that due to her change in pain she would feel most comfortable having abdominal CT scan performed today.  Contrast CT scan of abdomen pelvis ordered and showed no acute intra-abdominal or intrapelvic process.  Moderate fecal retention consistent with constipation.  On serial reexamination patient abdomen remains soft,  nondistended.  She reports improvement in her symptoms.  Patient was not observed to have any vomiting throughout her entire emergency department stay.  Patient currently taking medication to assist with her constipation.  We will have patient follow-up with her GI provider.  Discussed results, findings, treatment and follow up. Patient advised of return precautions. Patient verbalized understanding and agreed with plan.   Final Clinical Impression(s) / ED Diagnoses Final diagnoses:  Constipation, unspecified constipation type  Left lower quadrant abdominal pain    Rx / DC Orders ED Discharge Orders    None       Berneice Heinrich 03/03/21 1728    Bethann Berkshire, MD 03/05/21 0730

## 2021-03-03 NOTE — Discharge Instructions (Addendum)
You came to the emergency department today to be evaluated for your worsening abdominal pain.  Your physical exam lab work were reassuring.  Your CT scan showed that you have fecal retention consistent with constipation.  There is no acute problem with your abdomen or pelvis.   Please continue to patient's prescribed by your provider.  Please follow-up with your GI provider.

## 2021-03-03 NOTE — ED Triage Notes (Signed)
Abdominal pain x 3 weeks

## 2021-03-05 ENCOUNTER — Emergency Department (HOSPITAL_BASED_OUTPATIENT_CLINIC_OR_DEPARTMENT_OTHER)
Admission: EM | Admit: 2021-03-05 | Discharge: 2021-03-05 | Disposition: A | Discharge status: Home / Self Care | Payer: Medicaid Other | Attending: Emergency Medicine | Admitting: Emergency Medicine

## 2021-03-05 ENCOUNTER — Encounter (HOSPITAL_BASED_OUTPATIENT_CLINIC_OR_DEPARTMENT_OTHER): Payer: Self-pay

## 2021-03-05 ENCOUNTER — Other Ambulatory Visit: Payer: Self-pay

## 2021-03-05 DIAGNOSIS — R509 Fever, unspecified: Secondary | ICD-10-CM | POA: Diagnosis not present

## 2021-03-05 DIAGNOSIS — R1084 Generalized abdominal pain: Secondary | ICD-10-CM | POA: Insufficient documentation

## 2021-03-05 DIAGNOSIS — R112 Nausea with vomiting, unspecified: Secondary | ICD-10-CM | POA: Insufficient documentation

## 2021-03-05 LAB — CBC WITH DIFFERENTIAL/PLATELET
Abs Immature Granulocytes: 0.02 10*3/uL (ref 0.00–0.07)
Basophils Absolute: 0 10*3/uL (ref 0.0–0.1)
Basophils Relative: 1 %
Eosinophils Absolute: 0.1 10*3/uL (ref 0.0–0.5)
Eosinophils Relative: 1 %
HCT: 36.2 % (ref 36.0–46.0)
Hemoglobin: 11.6 g/dL — ABNORMAL LOW (ref 12.0–15.0)
Immature Granulocytes: 0 %
Lymphocytes Relative: 27 %
Lymphs Abs: 1.9 10*3/uL (ref 0.7–4.0)
MCH: 29.7 pg (ref 26.0–34.0)
MCHC: 32 g/dL (ref 30.0–36.0)
MCV: 92.8 fL (ref 80.0–100.0)
Monocytes Absolute: 0.5 10*3/uL (ref 0.1–1.0)
Monocytes Relative: 6 %
Neutro Abs: 4.6 10*3/uL (ref 1.7–7.7)
Neutrophils Relative %: 65 %
Platelets: 422 10*3/uL — ABNORMAL HIGH (ref 150–400)
RBC: 3.9 MIL/uL (ref 3.87–5.11)
RDW: 13.5 % (ref 11.5–15.5)
WBC: 7.1 10*3/uL (ref 4.0–10.5)
nRBC: 0 % (ref 0.0–0.2)

## 2021-03-05 LAB — COMPREHENSIVE METABOLIC PANEL
ALT: 27 U/L (ref 0–44)
AST: 24 U/L (ref 15–41)
Albumin: 3.9 g/dL (ref 3.5–5.0)
Alkaline Phosphatase: 46 U/L (ref 38–126)
Anion gap: 9 (ref 5–15)
BUN: 11 mg/dL (ref 6–20)
CO2: 24 mmol/L (ref 22–32)
Calcium: 8.8 mg/dL — ABNORMAL LOW (ref 8.9–10.3)
Chloride: 104 mmol/L (ref 98–111)
Creatinine, Ser: 0.7 mg/dL (ref 0.44–1.00)
GFR, Estimated: >60 mL/min (ref >60)
Glucose, Bld: 106 mg/dL — ABNORMAL HIGH (ref 70–99)
Potassium: 4 mmol/L (ref 3.5–5.1)
Sodium: 137 mmol/L (ref 135–145)
Total Bilirubin: 0.3 mg/dL (ref 0.3–1.2)
Total Protein: 7.7 g/dL (ref 6.5–8.1)

## 2021-03-05 LAB — URINALYSIS, ROUTINE W REFLEX MICROSCOPIC
Bilirubin Urine: NEGATIVE
Glucose, UA: NEGATIVE mg/dL
Hgb urine dipstick: NEGATIVE
Ketones, ur: NEGATIVE mg/dL
Leukocytes,Ua: NEGATIVE
Nitrite: NEGATIVE
Protein, ur: NEGATIVE mg/dL
Specific Gravity, Urine: 1.025 (ref 1.005–1.030)
pH: 7 (ref 5.0–8.0)

## 2021-03-05 LAB — LIPASE, BLOOD: Lipase: 29 U/L (ref 11–51)

## 2021-03-05 LAB — OCCULT BLOOD X 1 CARD TO LAB, STOOL: Fecal Occult Bld: NEGATIVE

## 2021-03-05 MED ORDER — MORPHINE SULFATE (PF) 2 MG/ML IV SOLN
2.0000 mg | Freq: Once | INTRAVENOUS | Status: AC
  Administered 2021-03-05: 2 mg via INTRAVENOUS
  Filled 2021-03-05: qty 1

## 2021-03-05 MED ORDER — SODIUM CHLORIDE 0.9 % IV BOLUS
1000.0000 mL | Freq: Once | INTRAVENOUS | Status: AC
  Administered 2021-03-05: 1000 mL via INTRAVENOUS

## 2021-03-05 MED ORDER — ONDANSETRON HCL 4 MG/2ML IJ SOLN
4.0000 mg | Freq: Once | INTRAMUSCULAR | Status: AC
  Administered 2021-03-05: 4 mg via INTRAVENOUS
  Filled 2021-03-05: qty 2

## 2021-03-05 NOTE — ED Provider Notes (Signed)
[facility] EMERGENCY DEPARTMENT Bethany [name] Note   CSN: [id number] Arrival date & time: 03/05/21  1230     History Chief Complaint  Patient presents with  . Abdominal Pain  . Hemoptysis    Bethany [name] is a 36 y.o. female.  35 year old female with history of abdominal pain return to the emergency room with ongoing left-sided abdominal pain, reports emesis with streaks of bright red blood in it as well as fever of 102.3 yesterday.  Denies changes in bowel or bladder habits, last bowel movement was this morning, reportedly normal.  Reports this to be an ongoing problem for several months however pain is worse today, taking medications as prescribed by her GI, plan for colonoscopy next month.  Abdominal surgeries include hysterectomy, C-section x4 and cholecystectomy.        Past Medical History:  Diagnosis Date  . Anemia     There are no problems to display for this patient.   Past Surgical History:  Procedure Laterality Date  . ABDOMINAL HYSTERECTOMY    . CESAREAN SECTION     x 4  . CHOLECYSTECTOMY       OB History   No obstetric history on [name].     Family History  Problem Relation Age of Onset  . Diabetes Mother   . Hypertension Mother     Social History   Tobacco Use  . Smoking status: Never Smoker  . Smokeless tobacco: Never Used  Vaping Use  . Vaping Use: Never used  Substance Use Topics  . Alcohol use: Never  . Drug use: Never    Home Medications Prior to Admission medications   Medication Sig Start Date End Date Taking? Authorizing Jahziel Sinn  acetaminophen (TYLENOL) 500 MG tablet Take 1,000 mg by mouth every 6 (six) hours as needed for mild pain.   Yes Jamarrion Budai, Historical, MD  ferrous sulfate 325 (65 FE) MG tablet Take 325 mg by mouth 3 (three) times daily with meals. 06/20/20  Yes Stein Windhorst, Historical, MD  pantoprazole (PROTONIX) 40 MG tablet Take 40 mg by mouth 2 (two) times daily.   Yes Jelitza Manninen, Historical, MD  promethazine  (PHENERGAN) 25 MG tablet Take 25 mg by mouth every 6 (six) hours as needed for nausea or vomiting.   Yes Bea Duren, Historical, MD  omeprazole (PRILOSEC) 20 MG capsule Take 1 capsule (20 mg total) by mouth daily. Patient not taking: Reported on 02/12/2021 01/07/21 02/06/21  Carroll Sage, PA-C  promethazine (PHENERGAN) 12.5 MG suppository Place 1 suppository (12.5 mg total) rectally every 8 (eight) hours as needed for up to 5 days for nausea or vomiting. Patient not taking: Reported on 02/12/2021 01/07/21 01/12/21  Carroll Sage, PA-C    Allergies    Celecoxib, Fanatrex fusepaq, and Gabapentin  Review of Systems   Review of Systems  Constitutional: Positive for fever.  Respiratory: Negative for shortness of breath.   Cardiovascular: Negative for chest pain.  Gastrointestinal: Positive for abdominal pain, nausea and vomiting. Negative for blood in stool, constipation and diarrhea.  Genitourinary: Negative for dysuria and frequency.  Musculoskeletal: Negative for arthralgias and myalgias.  Skin: Negative for pallor, rash and wound.  Allergic/Immunologic: Negative for immunocompromised state.  Neurological: Negative for dizziness, weakness and headaches.  Hematological: Does not bruise/bleed easily.  Psychiatric/Behavioral: Negative for confusion.  All other systems reviewed and are negative.   Physical Exam Updated Vital Signs BP (!) 129/104 (BP Location: Right Arm)   Pulse (!) 105   Temp 98.7 F (37.1 C) (Oral)  Resp 18   Ht 5' (1.524 m)   Wt 93.9 kg   SpO2 99%   BMI 40.43 kg/m   Physical Exam Vitals and nursing note reviewed. Exam conducted with a chaperone present.  Constitutional:      General: She is not in acute distress.    Appearance: She is well-developed. She is not diaphoretic.  HENT:     Head: Normocephalic and atraumatic.  Cardiovascular:     Rate and Rhythm: Normal rate and regular rhythm.     Heart sounds: Normal heart sounds.  Pulmonary:     Effort:  Pulmonary effort is normal.     Breath sounds: Normal breath sounds.  Abdominal:     General: Bowel sounds are normal.     Palpations: Abdomen is soft.     Tenderness: There is generalized abdominal tenderness and tenderness in the left upper quadrant and left lower quadrant. There is no right CVA tenderness or left CVA tenderness.  Genitourinary:    Rectum: Normal. Guaiac result negative. No tenderness.  Skin:    General: Skin is warm and dry.     Findings: No erythema or rash.  Neurological:     Mental Status: She is alert and oriented to person, place, and time.  Psychiatric:        Behavior: Behavior normal.     ED Results / Procedures / Treatments   Labs (all labs ordered are listed, but only abnormal results are displayed) Labs Reviewed  CBC WITH DIFFERENTIAL/PLATELET - Abnormal; Notable for the following components:      Result Value   Hemoglobin 11.6 (*)    Platelets 422 (*)    All other components within normal limits  COMPREHENSIVE METABOLIC PANEL - Abnormal; Notable for the following components:   Glucose, Bld 106 (*)    Calcium 8.8 (*)    All other components within normal limits  LIPASE, BLOOD  URINALYSIS, ROUTINE W REFLEX MICROSCOPIC  OCCULT BLOOD X 1 CARD TO LAB, STOOL  POC OCCULT BLOOD, ED    EKG None  Radiology CT Abdomen Pelvis W Contrast  Result Date: 03/03/2021 CLINICAL DATA:  Abdominal pain for 3 months, worsening today, left upper quadrant pain EXAM: CT ABDOMEN AND PELVIS WITH CONTRAST TECHNIQUE: Multidetector CT imaging of the abdomen and pelvis was performed using the standard protocol following bolus administration of intravenous contrast. CONTRAST:  OMNIPAQUE IOHEXOL 300 MG/ML  SOLN COMPARISON:  02/24/2021 FINDINGS: Lower chest: No acute pleural or parenchymal lung disease. Hepatobiliary: No focal liver abnormality is seen. Status post cholecystectomy. No biliary dilatation. Pancreas: Unremarkable. No pancreatic ductal dilatation or  surrounding inflammatory changes. Spleen: Normal in size without focal abnormality. Adrenals/Urinary Tract: Adrenal glands are unremarkable. Kidneys are normal, without renal calculi, focal lesion, or hydronephrosis. Bladder is unremarkable. Stomach/Bowel: No bowel obstruction or ileus. Moderate stool throughout the colon consistent with constipation. Normal appendix right lower quadrant. No bowel wall thickening or inflammatory change. Vascular/Lymphatic: No significant vascular findings are present. No enlarged abdominal or pelvic lymph nodes. Reproductive: Status post hysterectomy. No adnexal masses. Other: No free fluid or free gas. Small fat containing umbilical hernia unchanged. Musculoskeletal: No acute or destructive bony lesions. Reconstructed images demonstrate no additional findings. IMPRESSION: 1. No acute intra-abdominal or intrapelvic process. 2. Moderate fecal retention consistent with constipation. Electronically Signed   By: Sharlet Salina M.D.   On: 03/03/2021 15:29    Procedures Procedures   Medications Ordered in ED Medications  ondansetron (ZOFRAN) injection 4 mg (has no administration  in time range)  sodium chloride 0.9 % bolus 1,000 mL (0 mLs Intravenous Stopped 03/05/21 1429)  ondansetron (ZOFRAN) injection 4 mg (4 mg Intravenous Given 03/05/21 1314)  morphine 2 MG/ML injection 2 mg (2 mg Intravenous Given 03/05/21 1315)    ED Course  I have reviewed the triage vital signs and the nursing notes.  Pertinent labs & imaging results that were available during my care of the patient were reviewed by me and considered in my medical decision making (see chart for details).  Clinical Course as of 03/05/21 1429  Sat Mar 05, 2021  [id number] 35 year old female with complaint of ongoing abdominal pain and bright red blood in emesis and reported fever yesterday.  Recent ER visits reviewed, most recently to the emergency room 2 days ago for same, had a CT scan at that time that was  unrevealing. On exam, has mild generalized abdominal tenderness, possibly worse on the left, Hemoccult is negative, stool is soft and brown. CBC without significant change from prior, hemoglobin currently 11.6.  Urinalysis is unremarkable, CMP within normal limits, lipase within normal limits. Patient was given morphine, fluids, Zofran.  Reports one additional episode of emesis while in the bathroom, and given additional Zofran prior to discharge home.  Patient advised to take her medications as prescribed and follow-up with her GI team. [LM]    Clinical Course User Index [LM] Alden Hipp   MDM Rules/Calculators/A&P                         Final Clinical Impression(s) / ED Diagnoses Final diagnoses:  Generalized abdominal pain  Non-intractable vomiting with nausea, unspecified vomiting type    Rx / DC Orders ED Discharge Orders    None       Jeannie Fend, PA-C 03/05/21 1429    Virgina Norfolk, DO 03/05/21 1449

## 2021-03-05 NOTE — Discharge Instructions (Signed)
Follow up with your GI specialist.

## 2021-03-05 NOTE — ED Triage Notes (Signed)
Reports left sided lower abd pain that radiates to the right side x few weeks. Began vomiting blood yesterday. Reports a little blood in the vomit this morning. Has a colonoscopy scheduled for April 13 but pain is a 10/10 right now.

## 2021-03-13 ENCOUNTER — Emergency Department
Admission: EM | Admit: 2021-03-13 | Discharge: 2021-03-13 | Disposition: A | Discharge status: Home / Self Care | Payer: Medicaid Other | Attending: Emergency Medicine | Admitting: Emergency Medicine

## 2021-03-13 ENCOUNTER — Other Ambulatory Visit: Payer: Self-pay

## 2021-03-13 ENCOUNTER — Encounter: Payer: Self-pay | Admitting: Intensive Care

## 2021-03-13 DIAGNOSIS — R1032 Left lower quadrant pain: Secondary | ICD-10-CM | POA: Insufficient documentation

## 2021-03-13 DIAGNOSIS — R112 Nausea with vomiting, unspecified: Secondary | ICD-10-CM | POA: Insufficient documentation

## 2021-03-13 LAB — COMPREHENSIVE METABOLIC PANEL
ALT: 19 U/L (ref 0–44)
AST: 17 U/L (ref 15–41)
Albumin: 4.2 g/dL (ref 3.5–5.0)
Alkaline Phosphatase: 47 U/L (ref 38–126)
Anion gap: 9 (ref 5–15)
BUN: 13 mg/dL (ref 6–20)
CO2: 23 mmol/L (ref 22–32)
Calcium: 8.9 mg/dL (ref 8.9–10.3)
Chloride: 102 mmol/L (ref 98–111)
Creatinine, Ser: 0.74 mg/dL (ref 0.44–1.00)
GFR, Estimated: >60 mL/min (ref >60)
Glucose, Bld: 116 mg/dL — ABNORMAL HIGH (ref 70–99)
Potassium: 4.3 mmol/L (ref 3.5–5.1)
Sodium: 134 mmol/L — ABNORMAL LOW (ref 135–145)
Total Bilirubin: 0.5 mg/dL (ref 0.3–1.2)
Total Protein: 7.9 g/dL (ref 6.5–8.1)

## 2021-03-13 LAB — CBC
HCT: 38.5 % (ref 36.0–46.0)
Hemoglobin: 12.3 g/dL (ref 12.0–15.0)
MCH: 29.4 pg (ref 26.0–34.0)
MCHC: 31.9 g/dL (ref 30.0–36.0)
MCV: 91.9 fL (ref 80.0–100.0)
Platelets: 397 10*3/uL (ref 150–400)
RBC: 4.19 MIL/uL (ref 3.87–5.11)
RDW: 13.8 % (ref 11.5–15.5)
WBC: 6 10*3/uL (ref 4.0–10.5)
nRBC: 0 % (ref 0.0–0.2)

## 2021-03-13 LAB — LIPASE, BLOOD: Lipase: 30 U/L (ref 11–51)

## 2021-03-13 MED ORDER — DROPERIDOL 2.5 MG/ML IJ SOLN
2.5000 mg | Freq: Once | INTRAMUSCULAR | Status: AC
  Administered 2021-03-13: 2.5 mg via INTRAVENOUS
  Filled 2021-03-13: qty 2

## 2021-03-13 MED ORDER — KETOROLAC TROMETHAMINE 30 MG/ML IJ SOLN
15.0000 mg | Freq: Once | INTRAMUSCULAR | Status: AC
  Administered 2021-03-13: 15 mg via INTRAVENOUS
  Filled 2021-03-13: qty 1

## 2021-03-13 MED ORDER — LACTATED RINGERS IV BOLUS
1000.0000 mL | Freq: Once | INTRAVENOUS | Status: AC
  Administered 2021-03-13: 1000 mL via INTRAVENOUS

## 2021-03-13 MED ORDER — PANTOPRAZOLE SODIUM 40 MG IV SOLR
40.0000 mg | Freq: Once | INTRAVENOUS | Status: AC
  Administered 2021-03-13: 40 mg via INTRAVENOUS
  Filled 2021-03-13: qty 40

## 2021-03-13 NOTE — ED Triage Notes (Signed)
Patient c/o LLQ abdominal pain and emesis with blood that started yesterday. Reports hx of same

## 2021-03-13 NOTE — ED Notes (Signed)
See triage note, pt reports LLQ pain with vomiting blood that started yesterday. Reports vomiting x1 today.  Hx of colonscopy. Pt ambulatory to treatment room, clear speech, alert and oriented

## 2021-03-13 NOTE — Discharge Instructions (Signed)
Please follow-up with your GI doctor as scheduled and ensure you make it to the colonoscopy.  If you develop any worsening symptoms despite your typical medications, please return to the ED.  If you develop any fevers or uncontrolled vomiting with your symptoms, please return to the ED.

## 2021-03-13 NOTE — ED Provider Notes (Signed)
[facility] Emergency Department Provider Note ____________________________________________   Event Date/Time   First MD Initiated Contact with Patient 03/13/21 1151     (approximate)  I have reviewed the triage vital signs and the nursing notes.  HISTORY  Chief Complaint Abdominal Pain and Emesis   HPI Bethany [name] is a 35 y.o. femalewho presents to the ED for evaluation of abdominal pain and emesis.  Chart review indicates history of anemia on iron supplementation, GERD on Protonix.  Frequent ED visits noted for abdominal pain, 6 visits noted in the past 2 weeks for the same.  As well as outpatient GI visit on 3/18.  She has had EGD and colonoscopy performed in 2021, less than a year ago.  EGD was normal and colonoscopy showed some internal hemorrhoids.  Patient reports having a repeat colonoscopy scheduled for next week.  Patient presents to the ED for evaluation of acute on chronic abdominal pain with associated emesis.  She reports worsening pain and emesis over the past 2 days.  She reports her pain is to her LLQ, similar quality as her chronic pain, but worse intensity and up to 10/10 intensity.  She reports associated nausea and emesis with up to 5-6 episodes of emesis over the past 2 days.  She reports initially nonbloody nonbilious emesis, progressing to bloody-streaking emesis.   Patient denies any fevers, diarrhea or stool changes, dysuria, hematuria or any new abdominal pain.  Denies flank pain, chest pain, shortness of breath, syncope  Past Medical History:  Diagnosis Date  . Anemia     There are no problems to display for this patient.   Past Surgical History:  Procedure Laterality Date  . ABDOMINAL HYSTERECTOMY    . CESAREAN SECTION     x 4  . CHOLECYSTECTOMY      Prior to Admission medications   Medication Sig Start Date End Date Taking? Authorizing Provider  acetaminophen (TYLENOL) 500 MG tablet Take 1,000 mg by mouth every 6  (six) hours as needed for mild pain.    [provider]  ferrous sulfate 325 (65 FE) MG tablet Take 325 mg by mouth 3 (three) times daily with meals. 06/20/20   [provider]  omeprazole (PRILOSEC) 20 MG capsule TAKE 1 CAPSULE BY MOUTH ONCE DAILY Patient not taking: Reported on 02/12/2021 01/07/21 01/07/22  Carroll Sage, PA-C  ondansetron (ZOFRAN) 4 MG tablet TAKE 1 TABLET (4 MG TOTAL) BY MOUTH EVERY 6 (SIX) HOURS FOR 7 DAYS. Patient not taking: No sig reported 01/03/21 01/03/22  Claude Manges, PA-C  pantoprazole (PROTONIX) 40 MG tablet Take 40 mg by mouth 2 (two) times daily.    [provider]  promethazine (PHENERGAN) 12.5 MG suppository UNWRAP AND PLACE 1 SUPPOSITORY RECTALLY EVERY 6 HOURS AS NEEDED FOR NAUSEA & VOMITING Patient not taking: Reported on 02/12/2021 01/07/21 01/07/22  Carroll Sage, PA-C  promethazine (PHENERGAN) 25 MG tablet Take 25 mg by mouth every 6 (six) hours as needed for nausea or vomiting.    [provider]    Allergies Celecoxib, Fanatrex fusepaq, and Gabapentin  Family History  Problem Relation Age of Onset  . Diabetes Mother   . Hypertension Mother     Social History Social History   Tobacco Use  . Smoking status: Never Smoker  . Smokeless tobacco: Never Used  Vaping Use  . Vaping Use: Never used  Substance Use Topics  . Alcohol use: Never  . Drug use: Never    Review of  Systems  Constitutional: No fever/chills Eyes: No visual changes. ENT: No sore throat. Cardiovascular: Denies chest pain. Respiratory: Denies shortness of breath. Gastrointestinal: Positive for acute on chronic abdominal pain with associated nausea and vomiting   No diarrhea.  No constipation. Genitourinary: Negative for dysuria. Musculoskeletal: Negative for back pain. Skin: Negative for rash. Neurological: Negative for headaches, focal weakness or numbness.  ____________________________________________   PHYSICAL EXAM:  VITAL  SIGNS: Vitals:   03/13/21 1330 03/13/21 1531  BP: (!) 145/92 (!) 150/100  Pulse: 95 98  Resp: 19 20  Temp:  98 F (36.7 C)  SpO2: 97% 100%    Constitutional: Alert and oriented. Well appearing and in no acute distress. Eyes: Conjunctivae are normal. PERRL. EOMI. Head: Atraumatic. Nose: No congestion/rhinnorhea. Mouth/Throat: Mucous membranes are moist.  Oropharynx non-erythematous. Neck: No stridor. No cervical spine tenderness to palpation. Cardiovascular: Normal rate, regular rhythm. Grossly normal heart sounds.  Good peripheral circulation. Respiratory: Normal respiratory effort.  No retractions. Lungs CTAB. Gastrointestinal: Soft , nondistended. No CVA tenderness. LLQ tenderness without peritoneal features.  Deep palpation is well-tolerated without apparent discomfort, but she does report tenderness Musculoskeletal: No lower extremity tenderness nor edema.  No joint effusions. No signs of acute trauma. Neurologic:  Normal speech and language. No gross focal neurologic deficits are appreciated. No gait instability noted. Skin:  Skin is warm, dry and intact. No rash noted. Psychiatric: Mood and affect are normal. Speech and behavior are normal. ____________________________________________   LABS (all labs ordered are listed, but only abnormal results are displayed)  Labs Reviewed  COMPREHENSIVE METABOLIC PANEL - Abnormal; Notable for the following components:      Result Value   Sodium 134 (*)    Glucose, Bld 116 (*)    All other components within normal limits  LIPASE, BLOOD  CBC  URINALYSIS, COMPLETE (UACMP) WITH MICROSCOPIC   ____________________________________________  12 Lead EKG  Poor quality EKG.  Sinus rhythm, rate of 110 bpm.  Normal axis intervals.  No evidence of acute ischemia.  Sinus tachycardia  ____________________________________________   PROCEDURES and INTERVENTIONS  Procedure(s) performed (including Critical Care):  .1-3 Lead EKG  Interpretation Performed by: Delton Prairie, MD Authorized by: Delton Prairie, MD     Interpretation: normal     ECG rate:  92   ECG rate assessment: normal     Rhythm: sinus rhythm     Ectopy: none     Conduction: normal      Medications  droperidol (INAPSINE) 2.5 MG/ML injection 2.5 mg (2.5 mg Intravenous Given 03/13/21 1258)  lactated ringers bolus 1,000 mL (0 mLs Intravenous Stopped 03/13/21 1530)  ketorolac (TORADOL) 30 MG/ML injection 15 mg (15 mg Intravenous Given 03/13/21 1346)  pantoprazole (PROTONIX) injection 40 mg (40 mg Intravenous Given 03/13/21 1345)    ____________________________________________   MDM / ED COURSE   Obese 35 year old female with history of chronic abdominal pain presents to the ED with acute on chronic pain, without evidence of acute pathology, and ultimately amenable to discharge.  Presents tachycardic, resolving with IVF analgesia, otherwise normal vitals on room air.  Exam demonstrates an obese patient who has some mild tenderness to her left lower quadrant, but otherwise looks well.  No peritoneal features and her abdomen is otherwise benign.  Work-up is benign.  Blood work demonstrates no leukocytosis to suggest infectious etiology of her symptoms.  No significant electrolyte derangements, change in renal function, or evidence of acute pancreatitis.  After medications above, patient reports resolving pain and requests discharge prior  to urinalysis.  I advised her my recommendations for urinalysis to assess for urinary pathology such as acute cystitis contributing to her pain, she declines this and requests discharge.  She has capacity to make this decision and I see no barriers to outpatient management.  I do not see evidence of acute pathology to cause her pain on top of her chronic pain.  I discussed return precautions for the ED with the patient and urged her to follow-up with her GI physician and with her upcoming colonoscopy.  Patient stable for outpatient  management  Clinical Course as of 03/13/21 1555  Sun Mar 13, 2021  1342 Reassessed.  Patient reports improving symptoms.  She reports resolution of nausea and her pain is now down to 4/10.  We discussed additional analgesia and PPI, and awaiting urinalysis.  We again discussed her frequent CT scans of her abdomen and shared decision-making yields plan to hold off on imaging at this point and focus on her symptoms and urinalysis. [DS]  1519 Reassessed.  Patient reports improving pain, now 2/10 intensity.  I educate patient of need for urine sample, but she indicates that she needs to leave and is requesting discharge.  We discussed incomplete work-up and possibility of undiagnosed pathology, she indicates that she needs to go and is okay with this.  We discussed return precautions for the ED. [DS]    Clinical Course User Index [DS] Delton Prairie, MD    ____________________________________________   FINAL CLINICAL IMPRESSION(S) / ED DIAGNOSES  Final diagnoses:  Left lower quadrant abdominal pain     ED Discharge Orders    None       Tezra Mahr Katrinka Blazing   Note:  This document was prepared using Dragon voice recognition software and may include unintentional dictation errors.   Delton Prairie, MD 03/13/21 [phone]

## 2021-03-20 ENCOUNTER — Emergency Department (HOSPITAL_BASED_OUTPATIENT_CLINIC_OR_DEPARTMENT_OTHER)
Admission: EM | Admit: 2021-03-20 | Discharge: 2021-03-20 | Disposition: A | Discharge status: Home / Self Care | Payer: Medicaid Other | Attending: Emergency Medicine | Admitting: Emergency Medicine

## 2021-03-20 ENCOUNTER — Encounter (HOSPITAL_BASED_OUTPATIENT_CLINIC_OR_DEPARTMENT_OTHER): Payer: Self-pay | Admitting: Emergency Medicine

## 2021-03-20 ENCOUNTER — Other Ambulatory Visit: Payer: Self-pay

## 2021-03-20 DIAGNOSIS — R1032 Left lower quadrant pain: Secondary | ICD-10-CM | POA: Diagnosis not present

## 2021-03-20 DIAGNOSIS — G8929 Other chronic pain: Secondary | ICD-10-CM | POA: Insufficient documentation

## 2021-03-20 DIAGNOSIS — R109 Unspecified abdominal pain: Secondary | ICD-10-CM | POA: Diagnosis present

## 2021-03-20 DIAGNOSIS — K219 Gastro-esophageal reflux disease without esophagitis: Secondary | ICD-10-CM | POA: Insufficient documentation

## 2021-03-20 DIAGNOSIS — Z9049 Acquired absence of other specified parts of digestive tract: Secondary | ICD-10-CM | POA: Insufficient documentation

## 2021-03-20 DIAGNOSIS — G43A1 Cyclical vomiting, intractable: Secondary | ICD-10-CM | POA: Insufficient documentation

## 2021-03-20 DIAGNOSIS — R1115 Cyclical vomiting syndrome unrelated to migraine: Secondary | ICD-10-CM

## 2021-03-20 DIAGNOSIS — R197 Diarrhea, unspecified: Secondary | ICD-10-CM | POA: Insufficient documentation

## 2021-03-20 LAB — COMPREHENSIVE METABOLIC PANEL
ALT: 20 U/L (ref 0–44)
AST: 30 U/L (ref 15–41)
Albumin: 4.2 g/dL (ref 3.5–5.0)
Alkaline Phosphatase: 52 U/L (ref 38–126)
Anion gap: 8 (ref 5–15)
BUN: 12 mg/dL (ref 6–20)
CO2: 27 mmol/L (ref 22–32)
Calcium: 9.5 mg/dL (ref 8.9–10.3)
Chloride: 101 mmol/L (ref 98–111)
Creatinine, Ser: 0.75 mg/dL (ref 0.44–1.00)
GFR, Estimated: >60 mL/min (ref >60)
Glucose, Bld: 100 mg/dL — ABNORMAL HIGH (ref 70–99)
Potassium: 4.5 mmol/L (ref 3.5–5.1)
Sodium: 136 mmol/L (ref 135–145)
Total Bilirubin: 0.9 mg/dL (ref 0.3–1.2)
Total Protein: 7.9 g/dL (ref 6.5–8.1)

## 2021-03-20 LAB — CBC WITH DIFFERENTIAL/PLATELET
Abs Immature Granulocytes: 0.02 10*3/uL (ref 0.00–0.07)
Basophils Absolute: 0 10*3/uL (ref 0.0–0.1)
Basophils Relative: 1 %
Eosinophils Absolute: 0.1 10*3/uL (ref 0.0–0.5)
Eosinophils Relative: 1 %
HCT: 37.4 % (ref 36.0–46.0)
Hemoglobin: 12.2 g/dL (ref 12.0–15.0)
Immature Granulocytes: 0 %
Lymphocytes Relative: 26 %
Lymphs Abs: 1.7 10*3/uL (ref 0.7–4.0)
MCH: 30.5 pg (ref 26.0–34.0)
MCHC: 32.6 g/dL (ref 30.0–36.0)
MCV: 93.5 fL (ref 80.0–100.0)
Monocytes Absolute: 0.4 10*3/uL (ref 0.1–1.0)
Monocytes Relative: 6 %
Neutro Abs: 4.3 10*3/uL (ref 1.7–7.7)
Neutrophils Relative %: 66 %
Platelets: 409 10*3/uL — ABNORMAL HIGH (ref 150–400)
RBC: 4 MIL/uL (ref 3.87–5.11)
RDW: 13.8 % (ref 11.5–15.5)
WBC: 6.5 10*3/uL (ref 4.0–10.5)
nRBC: 0 % (ref 0.0–0.2)

## 2021-03-20 LAB — URINALYSIS, ROUTINE W REFLEX MICROSCOPIC
Bilirubin Urine: NEGATIVE
Glucose, UA: NEGATIVE mg/dL
Hgb urine dipstick: NEGATIVE
Ketones, ur: NEGATIVE mg/dL
Nitrite: NEGATIVE
Protein, ur: NEGATIVE mg/dL
Specific Gravity, Urine: 1.02 (ref 1.005–1.030)
pH: 6 (ref 5.0–8.0)

## 2021-03-20 LAB — URINALYSIS, MICROSCOPIC (REFLEX)

## 2021-03-20 LAB — HCG, SERUM, QUALITATIVE: Preg, Serum: NEGATIVE

## 2021-03-20 LAB — LIPASE, BLOOD: Lipase: 26 U/L (ref 11–51)

## 2021-03-20 MED ORDER — SODIUM CHLORIDE 0.9 % IV SOLN
25.0000 mg | Freq: Four times a day (QID) | INTRAVENOUS | Status: DC | PRN
  Filled 2021-03-20 (×2): qty 1

## 2021-03-20 MED ORDER — SODIUM CHLORIDE 0.9 % IV BOLUS
1000.0000 mL | Freq: Once | INTRAVENOUS | Status: AC
  Administered 2021-03-20: 1000 mL via INTRAVENOUS

## 2021-03-20 MED ORDER — HYDROMORPHONE HCL 1 MG/ML IJ SOLN
1.0000 mg | Freq: Once | INTRAMUSCULAR | Status: AC
Start: 2021-03-20 — End: 2021-03-20
  Administered 2021-03-20: 1 mg via INTRAVENOUS
  Filled 2021-03-20: qty 1

## 2021-03-20 MED ORDER — ONDANSETRON HCL 4 MG/2ML IJ SOLN
4.0000 mg | Freq: Once | INTRAMUSCULAR | Status: AC
  Administered 2021-03-20: 4 mg via INTRAVENOUS
  Filled 2021-03-20: qty 2

## 2021-03-20 MED ORDER — PROMETHAZINE HCL 25 MG/ML IJ SOLN
25.0000 mg | Freq: Four times a day (QID) | INTRAMUSCULAR | Status: DC | PRN
  Administered 2021-03-20: 25 mg via INTRAMUSCULAR
  Filled 2021-03-20: qty 1

## 2021-03-20 MED ORDER — MORPHINE SULFATE (PF) 4 MG/ML IV SOLN
4.0000 mg | Freq: Once | INTRAVENOUS | Status: AC
  Administered 2021-03-20: 4 mg via INTRAVENOUS
  Filled 2021-03-20: qty 1

## 2021-03-20 NOTE — Discharge Instructions (Signed)
Please follow-up with your GI doctor in the next couple of days as scheduled.  Make sure to stay hydrated.  Take your nausea medications and your other medications as prescribed.  If you have any new worsening concerning symptom please come to the emerge department.   Get help right away if: You have blood in your vomit. Your vomit looks like coffee grounds. You have stools that are bloody or black, or stools that look like tar. You have signs of dehydration, such as: Sunken eyes. Not making tears while crying. Very dry mouth. Cracked lips. Decreased urine production. Dark urine. Urine may be the color of tea. Weakness. Sleepiness.

## 2021-03-20 NOTE — ED Notes (Signed)
Patient states abdominal pain x 2 months with vomiting blood since yesterday.

## 2021-03-20 NOTE — ED Provider Notes (Signed)
[facility] EMERGENCY DEPARTMENT Provider Note   CSN: [id number] Arrival date & time: 03/20/21  1050     History Chief Complaint  Patient presents with  . Hematemesis    Presly Steinruck is a 35 y.o. female with pertient past medical history of anemia on iron supplementation, GERD on Protonix, frequent ED visits for abdominal pain that presents emerge department today for chronic abdominal pain hematemesis.  Patient prescribed Linzess and Elavil for her abdominal pain, however patient states that she stopped taking it because she felt as though it was not working.   Per chart review patient has had 7 visits noted in the past 3 weeks for the same, last visit was at [facility] 7 days ago.  Patient also had an outpatient GI visit on 3/18 with an EGD and colonoscopy performed in 2021, showed some internal hemorrhoids otherwise no abnormalities..  Most recent CT scan on March 1 was normal.  Abdominal surgeries include cholecystectomy and hysterectomy.  Patient states that after she was discharged her abdominal pain  And emesis resolved, then yesterday night she started having recurrent symptoms.  Also admits to some loose stools, no bloody stools.  Patient states that she is unable to keep anything down including the Tylenol she tried to take for abdominal pain.  States that she is unsure why this keeps happening, however does have a repeat colonoscopy next week.  Denies any substance use, alcohol use.Patient states that hematemesis is bloody streaks, no gross hematemesis. Is common for her.  Abdominal pain is similar to her prior episodes, and the left lower quadrant/suprapubic region.  Denies any dysuria or hematuria.  Denies any vaginal complaints.  Denies any back pain, chest pain or shortness of breath.  Denies any fevers.  States that she is primarily here for pain control and nausea and vomiting since she cannot keep anything down.  HPI     Past Medical History:  Diagnosis Date  .  Anemia     There are no problems to display for this patient.   Past Surgical History:  Procedure Laterality Date  . ABDOMINAL HYSTERECTOMY    . CESAREAN SECTION     x 4  . CHOLECYSTECTOMY       OB History   No obstetric history on file.     Family History  Problem Relation Age of Onset  . Diabetes Mother   . Hypertension Mother     Social History   Tobacco Use  . Smoking status: Never Smoker  . Smokeless tobacco: Never Used  Vaping Use  . Vaping Use: Never used  Substance Use Topics  . Alcohol use: Never  . Drug use: Never    Home Medications Prior to Admission medications   Medication Sig Start Date End Date Taking? Authorizing Provider  acetaminophen (TYLENOL) 500 MG tablet Take 1,000 mg by mouth every 6 (six) hours as needed for mild pain.    [provider]  ferrous sulfate 325 (65 FE) MG tablet Take 325 mg by mouth 3 (three) times daily with meals. 06/20/20   [provider]  omeprazole (PRILOSEC) 20 MG capsule TAKE 1 CAPSULE BY MOUTH ONCE DAILY Patient not taking: Reported on 02/12/2021 01/07/21 01/07/22  Carroll Sage, PA-C  ondansetron (ZOFRAN) 4 MG tablet TAKE 1 TABLET (4 MG TOTAL) BY MOUTH EVERY 6 (SIX) HOURS FOR 7 DAYS. Patient not taking: No sig reported 01/03/21 01/03/22  Claude Manges, PA-C  pantoprazole (PROTONIX) 40 MG tablet Take 40 mg by mouth 2 (  two) times daily.    [provider]  promethazine (PHENERGAN) 12.5 MG suppository UNWRAP AND PLACE 1 SUPPOSITORY RECTALLY EVERY 6 HOURS AS NEEDED FOR NAUSEA & VOMITING Patient not taking: Reported on 02/12/2021 01/07/21 01/07/22  Carroll SageFaulkner, William J, PA-C  promethazine (PHENERGAN) 25 MG tablet Take 25 mg by mouth every 6 (six) hours as needed for nausea or vomiting.    [provider]    Allergies    Celecoxib, Fanatrex fusepaq, and Gabapentin  Review of Systems   Review of Systems  Constitutional: Negative for chills, diaphoresis, fatigue and fever.  HENT: Negative  for congestion, sore throat and trouble swallowing.   Eyes: Negative for pain and visual disturbance.  Respiratory: Negative for cough, shortness of breath and wheezing.   Cardiovascular: Negative for chest pain, palpitations and leg swelling.  Gastrointestinal: Positive for abdominal pain, diarrhea, nausea and vomiting. Negative for abdominal distention.  Genitourinary: Negative for difficulty urinating.  Musculoskeletal: Negative for back pain, neck pain and neck stiffness.  Skin: Negative for pallor.  Neurological: Negative for dizziness, speech difficulty, weakness and headaches.  Psychiatric/Behavioral: Negative for confusion.    Physical Exam Updated Vital Signs BP (!) 140/102 (BP Location: Left Arm)   Pulse 73   Temp 98.8 F (37.1 C)   Resp 16   Ht 5' (1.524 m)   Wt 93.9 kg   SpO2 98%   BMI 40.43 kg/m   Physical Exam Constitutional:      General: She is not in acute distress.    Appearance: Normal appearance. She is not ill-appearing, toxic-appearing or diaphoretic.  HENT:     Mouth/Throat:     Mouth: Mucous membranes are moist.     Pharynx: Oropharynx is clear.  Eyes:     General: No scleral icterus.    Extraocular Movements: Extraocular movements intact.     Pupils: Pupils are equal, round, and reactive to light.  Cardiovascular:     Rate and Rhythm: Normal rate and regular rhythm.     Pulses: Normal pulses.     Heart sounds: Normal heart sounds.  Pulmonary:     Effort: Pulmonary effort is normal. No respiratory distress.     Breath sounds: Normal breath sounds. No stridor. No wheezing, rhonchi or rales.  Chest:     Chest wall: No tenderness.  Abdominal:     General: Abdomen is flat. There is no distension.     Palpations: Abdomen is soft.     Tenderness: There is abdominal tenderness in the suprapubic area and left lower quadrant. There is no right CVA tenderness, left CVA tenderness, guarding or rebound. Negative signs include Murphy's sign, Rovsing's sign,  McBurney's sign and psoas sign.    Musculoskeletal:        General: No swelling or tenderness. Normal range of motion.     Cervical back: Normal range of motion and neck supple. No rigidity.     Right lower leg: No edema.     Left lower leg: No edema.  Skin:    General: Skin is warm and dry.     Capillary Refill: Capillary refill takes less than 2 seconds.     Coloration: Skin is not pale.  Neurological:     General: No focal deficit present.     Mental Status: She is alert and oriented to person, place, and time.  Psychiatric:        Mood and Affect: Mood normal.        Behavior: Behavior normal.  ED Results / Procedures / Treatments   Labs (all labs ordered are listed, but only abnormal results are displayed) Labs Reviewed  CBC WITH DIFFERENTIAL/PLATELET - Abnormal; Notable for the following components:      Result Value   Platelets 409 (*)    All other components within normal limits  COMPREHENSIVE METABOLIC PANEL - Abnormal; Notable for the following components:   Glucose, Bld 100 (*)    All other components within normal limits  URINALYSIS, ROUTINE W REFLEX MICROSCOPIC - Abnormal; Notable for the following components:   APPearance CLOUDY (*)    Leukocytes,Ua MODERATE (*)    All other components within normal limits  URINALYSIS, MICROSCOPIC (REFLEX) - Abnormal; Notable for the following components:   Bacteria, UA MANY (*)    All other components within normal limits  LIPASE, BLOOD  HCG, SERUM, QUALITATIVE    EKG None  Radiology No results found.  Procedures Procedures   Medications Ordered in ED Medications  promethazine (PHENERGAN) injection 25 mg (25 mg Intramuscular Given 03/20/21 1436)  sodium chloride 0.9 % bolus 1,000 mL (0 mLs Intravenous Stopped 03/20/21 1337)  ondansetron (ZOFRAN) injection 4 mg (4 mg Intravenous Given 03/20/21 1229)  morphine 4 MG/ML injection 4 mg (4 mg Intravenous Given 03/20/21 1229)  HYDROmorphone (DILAUDID) injection 1 mg (1  mg Intravenous Given 03/20/21 1421)    ED Course  I have reviewed the triage vital signs and the nursing notes.  Pertinent labs & imaging results that were available during my care of the patient were reviewed by me and considered in my medical decision making (see chart for details).    MDM Rules/Calculators/A&P                          Kalese Ensz is a 35 y.o. female with friend past medical history of anemia on iron supplementation, GERD on Protonix, frequent ED visits for abdominal pain that presents emerge department today for chronic abdominal pain hematemesis.  Patient appears well, chronic abdominal pain with no change in symptoms or change in exam.  Shared decision making about imaging, both decided that it is not necessary at this time.  Will ontain basic labs and pain control and nausea control.  Will reevaluate.  Upon reevaluation patient states that she feels much better, work-up today unremarkable.  CMP without electrolyte derangements.  Urinalysis does appear slightly dirty, however does have squamous cells, patient without any urinary symptoms.  Do not think that we need to treat this at this time.  CBC without any leukocytosis.  On repeat exam, patient without any abdominal tenderness, feels much better.  No signs of surgical abdomen.  Patient states that the pain has considerably gone down, has not vomited while being here.  Patient passed p.o. challenge, asking to discharge at this time. Will follow up with GI, does have an appointment coming up.   Doubt need for further emergent work up at this time. I explained the diagnosis and have given explicit precautions to return to the ER including for any other new or worsening symptoms. The patient understands and accepts the medical plan as it's been dictated and I have answered their questions. Discharge instructions concerning home care and prescriptions have been given. The patient is STABLE and is discharged to home in good  condition.     Final Clinical Impression(s) / ED Diagnoses Final diagnoses:  Chronic abdominal pain  Intractable cyclical vomiting with nausea    Rx / DC  Orders ED Discharge Orders    None       Farrel Gordon, PA-C 03/20/21 1603    Benjiman Core, MD 03/23/21 [phone]

## 2021-03-20 NOTE — ED Triage Notes (Signed)
Pt arrives pov with driver, reports LLQ pain and emesis with blood. Reports hx of same. Last emesis 0400 today, blood streaked

## 2021-03-22 ENCOUNTER — Other Ambulatory Visit: Payer: Self-pay

## 2021-03-22 ENCOUNTER — Other Ambulatory Visit (HOSPITAL_BASED_OUTPATIENT_CLINIC_OR_DEPARTMENT_OTHER): Payer: Self-pay

## 2021-03-22 ENCOUNTER — Encounter (HOSPITAL_BASED_OUTPATIENT_CLINIC_OR_DEPARTMENT_OTHER): Payer: Self-pay

## 2021-03-22 ENCOUNTER — Emergency Department (HOSPITAL_BASED_OUTPATIENT_CLINIC_OR_DEPARTMENT_OTHER)
Admission: EM | Admit: 2021-03-22 | Discharge: 2021-03-22 | Disposition: A | Discharge status: Home / Self Care | Payer: Medicaid Other | Attending: Emergency Medicine | Admitting: Emergency Medicine

## 2021-03-22 DIAGNOSIS — K920 Hematemesis: Secondary | ICD-10-CM | POA: Insufficient documentation

## 2021-03-22 DIAGNOSIS — R1032 Left lower quadrant pain: Secondary | ICD-10-CM | POA: Diagnosis not present

## 2021-03-22 LAB — URINALYSIS, ROUTINE W REFLEX MICROSCOPIC
Bilirubin Urine: NEGATIVE
Glucose, UA: NEGATIVE mg/dL
Hgb urine dipstick: NEGATIVE
Ketones, ur: NEGATIVE mg/dL
Nitrite: NEGATIVE
Protein, ur: NEGATIVE mg/dL
Specific Gravity, Urine: 1.015 (ref 1.005–1.030)
pH: 6.5 (ref 5.0–8.0)

## 2021-03-22 LAB — CBC WITH DIFFERENTIAL/PLATELET
Abs Immature Granulocytes: 0.02 10*3/uL (ref 0.00–0.07)
Basophils Absolute: 0 10*3/uL (ref 0.0–0.1)
Basophils Relative: 1 %
Eosinophils Absolute: 0.1 10*3/uL (ref 0.0–0.5)
Eosinophils Relative: 1 %
HCT: 38.5 % (ref 36.0–46.0)
Hemoglobin: 12.2 g/dL (ref 12.0–15.0)
Immature Granulocytes: 0 %
Lymphocytes Relative: 20 %
Lymphs Abs: 1.5 10*3/uL (ref 0.7–4.0)
MCH: 29.6 pg (ref 26.0–34.0)
MCHC: 31.7 g/dL (ref 30.0–36.0)
MCV: 93.4 fL (ref 80.0–100.0)
Monocytes Absolute: 0.3 10*3/uL (ref 0.1–1.0)
Monocytes Relative: 4 %
Neutro Abs: 5.7 10*3/uL (ref 1.7–7.7)
Neutrophils Relative %: 74 %
Platelets: 408 10*3/uL — ABNORMAL HIGH (ref 150–400)
RBC: 4.12 MIL/uL (ref 3.87–5.11)
RDW: 13.4 % (ref 11.5–15.5)
WBC: 7.5 10*3/uL (ref 4.0–10.5)
nRBC: 0 % (ref 0.0–0.2)

## 2021-03-22 LAB — COMPREHENSIVE METABOLIC PANEL
ALT: 16 U/L (ref 0–44)
AST: 17 U/L (ref 15–41)
Albumin: 2.5 g/dL — ABNORMAL LOW (ref 3.5–5.0)
Alkaline Phosphatase: 32 U/L — ABNORMAL LOW (ref 38–126)
Anion gap: 7 (ref 5–15)
BUN: 6 mg/dL (ref 6–20)
CO2: 13 mmol/L — ABNORMAL LOW (ref 22–32)
Calcium: 5.9 mg/dL — CL (ref 8.9–10.3)
Chloride: 118 mmol/L — ABNORMAL HIGH (ref 98–111)
Creatinine, Ser: 0.41 mg/dL — ABNORMAL LOW (ref 0.44–1.00)
GFR, Estimated: >60 mL/min (ref >60)
Glucose, Bld: 63 mg/dL — ABNORMAL LOW (ref 70–99)
Potassium: 3.5 mmol/L (ref 3.5–5.1)
Sodium: 138 mmol/L (ref 135–145)
Total Bilirubin: 0.3 mg/dL (ref 0.3–1.2)
Total Protein: 4.8 g/dL — ABNORMAL LOW (ref 6.5–8.1)

## 2021-03-22 LAB — PREGNANCY, URINE: Preg Test, Ur: NEGATIVE

## 2021-03-22 LAB — OCCULT BLOOD X 1 CARD TO LAB, STOOL: Fecal Occult Bld: NEGATIVE

## 2021-03-22 LAB — URINALYSIS, MICROSCOPIC (REFLEX)

## 2021-03-22 LAB — LIPASE, BLOOD: Lipase: 20 U/L (ref 11–51)

## 2021-03-22 LAB — CALCIUM: Calcium: 8.9 mg/dL (ref 8.9–10.3)

## 2021-03-22 MED ORDER — HYDROMORPHONE HCL 1 MG/ML IJ SOLN
1.0000 mg | Freq: Once | INTRAMUSCULAR | Status: AC
  Administered 2021-03-22: 1 mg via INTRAVENOUS
  Filled 2021-03-22: qty 1

## 2021-03-22 MED ORDER — METOCLOPRAMIDE HCL 10 MG PO TABS
10.0000 mg | ORAL_TABLET | Freq: Four times a day (QID) | ORAL | 0 refills | Status: AC
  Filled 2021-03-22: qty 15, 3d supply, fill #0

## 2021-03-22 MED ORDER — MORPHINE SULFATE (PF) 2 MG/ML IV SOLN: 2.0000 mg | Freq: Once | INTRAVENOUS | Status: DC

## 2021-03-22 MED ORDER — MORPHINE SULFATE (PF) 4 MG/ML IV SOLN
4.0000 mg | Freq: Once | INTRAVENOUS | Status: AC
Start: 2021-03-22 — End: 2021-03-22
  Administered 2021-03-22: 4 mg via INTRAVENOUS
  Filled 2021-03-22: qty 1

## 2021-03-22 MED ORDER — PROMETHAZINE HCL 25 MG/ML IJ SOLN
INTRAMUSCULAR | Status: AC
  Filled 2021-03-22: qty 1

## 2021-03-22 MED ORDER — SODIUM CHLORIDE 0.9 % IV SOLN
12.5000 mg | Freq: Four times a day (QID) | INTRAVENOUS | Status: DC | PRN
  Administered 2021-03-22: 12.5 mg via INTRAVENOUS
  Filled 2021-03-22: qty 0.5

## 2021-03-22 NOTE — ED Triage Notes (Signed)
Pt was seen multiple times within the past month for abdominal pain and vomiting blood. Here for same today. States unable to keep anything down since yesterday.

## 2021-03-22 NOTE — ED Notes (Signed)
Unsuccessful IV attempt RAC, labs collected. PT tolerated well.

## 2021-03-22 NOTE — ED Provider Notes (Signed)
[facility] EMERGENCY DEPARTMENT Provider Note   CSN: [id number] Arrival date & time: 03/22/21  1041     History Chief Complaint  Patient presents with  . Abdominal Pain    Bethany [name] is a 35 y.o. female.  HPI 35 year old female with an extensive history of abdominal pain, hematemesis, cholecystectomy, seen here multiple times within the past month for abdominal pain and vomiting blood, complaining of the same today.  She was seen here 2 days ago with same complaint.  Reports poor p.o. intake over the last 24 hours.  She states she has had 2 episodes of vomiting, with reported "straight blood".  She also endorses left lower quadrant pain which is consistent with her prior presentations.  She states that she has a colonoscopy tomorrow, but came to the ER for pain control.  She states she has had an EEG done in the past and had "something clipped".  She has a GI doctor through [facility].  She is on a daily PPI.  Denies any blood thinners.  No known fevers or chills.  Patient has had an extensive amount of CT scans in the last year, multiple pelvic ultrasounds.  She has not noted any blood in her stool.  Last bowel movement was yesterday and normal.    Past Medical History:  Diagnosis Date  . Anemia     There are no problems to display for this patient.   Past Surgical History:  Procedure Laterality Date  . ABDOMINAL HYSTERECTOMY    . CESAREAN SECTION     x 4  . CHOLECYSTECTOMY       OB History   No obstetric history on file.     Family History  Problem Relation Age of Onset  . Diabetes Mother   . Hypertension Mother     Social History   Tobacco Use  . Smoking status: Never Smoker  . Smokeless tobacco: Never Used  Vaping Use  . Vaping Use: Never used  Substance Use Topics  . Alcohol use: Never  . Drug use: Never    Home Medications Prior to Admission medications   Medication Sig Start Date End Date Taking? Authorizing Provider   metoCLOPramide (REGLAN) 10 MG tablet Take 1 tablet (10 mg total) by mouth every 6 (six) hours for 15 doses. 03/22/21 03/26/21 Yes Mare Ferrari, PA-C  omeprazole (PRILOSEC) 20 MG capsule TAKE 1 CAPSULE BY MOUTH ONCE DAILY 01/07/21 01/07/22 Yes Carroll Sage, PA-C  ondansetron (ZOFRAN) 4 MG tablet TAKE 1 TABLET (4 MG TOTAL) BY MOUTH EVERY 6 (SIX) HOURS FOR 7 DAYS. 01/03/21 01/03/22 Yes Soto, Leonie Douglas, PA-C  acetaminophen (TYLENOL) 500 MG tablet Take 1,000 mg by mouth every 6 (six) hours as needed for mild pain.    [provider]  ferrous sulfate 325 (65 FE) MG tablet Take 325 mg by mouth 3 (three) times daily with meals. 06/20/20   [provider]  pantoprazole (PROTONIX) 40 MG tablet Take 40 mg by mouth 2 (two) times daily.    [provider]  promethazine (PHENERGAN) 12.5 MG suppository UNWRAP AND PLACE 1 SUPPOSITORY RECTALLY EVERY 6 HOURS AS NEEDED FOR NAUSEA & VOMITING Patient not taking: Reported on 02/12/2021 01/07/21 01/07/22  Carroll Sage, PA-C  promethazine (PHENERGAN) 25 MG tablet Take 25 mg by mouth every 6 (six) hours as needed for nausea or vomiting.    [provider]    Allergies    Celecoxib, Fanatrex fusepaq, and Gabapentin  Review of Systems  Review of Systems  Constitutional: Negative for chills and fever.  HENT: Negative for ear pain and sore throat.   Eyes: Negative for pain and visual disturbance.  Respiratory: Negative for cough and shortness of breath.   Cardiovascular: Negative for chest pain and palpitations.  Gastrointestinal: Positive for abdominal pain, nausea and vomiting.  Genitourinary: Negative for dysuria and hematuria.  Musculoskeletal: Negative for arthralgias and back pain.  Skin: Negative for color change and rash.  Neurological: Negative for seizures and syncope.  All other systems reviewed and are negative.   Physical Exam Updated Vital Signs BP (!) 127/103 (BP Location: Right Arm)   Pulse 95   Temp 98.6  F (37 C) (Oral)   Resp 19   Ht 5' (1.524 m)   Wt 93.9 kg   SpO2 100%   BMI 40.43 kg/m   Physical Exam Vitals and nursing note reviewed.  Constitutional:      General: She is not in acute distress.    Appearance: She is well-developed.  HENT:     Head: Normocephalic and atraumatic.  Eyes:     Conjunctiva/sclera: Conjunctivae normal.  Cardiovascular:     Rate and Rhythm: Normal rate and regular rhythm.     Heart sounds: No murmur heard.   Pulmonary:     Effort: Pulmonary effort is normal. No respiratory distress.     Breath sounds: Normal breath sounds.  Abdominal:     General: Abdomen is flat.     Palpations: Abdomen is soft.     Tenderness: There is abdominal tenderness in the suprapubic area and left lower quadrant. There is no right CVA tenderness, left CVA tenderness or guarding. Negative signs include Murphy's sign.     Hernia: No hernia is present.     Comments: Patient with mild left lower quadrant and suprapubic tenderness.  This is consistent with prior presentation.  No guarding, no peritoneal signs.  No flank tenderness.  Musculoskeletal:     Cervical back: Neck supple.  Skin:    General: Skin is warm and dry.  Neurological:     Mental Status: She is alert.     ED Results / Procedures / Treatments   Labs (all labs ordered are listed, but only abnormal results are displayed) Labs Reviewed  CBC WITH DIFFERENTIAL/PLATELET - Abnormal; Notable for the following components:      Result Value   Platelets 408 (*)    All other components within normal limits  URINALYSIS, ROUTINE W REFLEX MICROSCOPIC - Abnormal; Notable for the following components:   Leukocytes,Ua MODERATE (*)    All other components within normal limits  URINALYSIS, MICROSCOPIC (REFLEX) - Abnormal; Notable for the following components:   Bacteria, UA FEW (*)    All other components within normal limits  COMPREHENSIVE METABOLIC PANEL - Abnormal; Notable for the following components:    Chloride 118 (*)    CO2 13 (*)    Glucose, Bld 63 (*)    Creatinine, Ser 0.41 (*)    Calcium 5.9 (*)    Total Protein 4.8 (*)    Albumin 2.5 (*)    Alkaline Phosphatase 32 (*)    All other components within normal limits  PREGNANCY, URINE  LIPASE, BLOOD  CALCIUM  OCCULT BLOOD X 1 CARD TO LAB, STOOL  POC OCCULT BLOOD, ED    EKG None  Radiology No results found.  Procedures Procedures   Medications Ordered in ED Medications  promethazine (PHENERGAN) 12.5 mg in sodium chloride 0.9 % 50 mL  IVPB (0 mg Intravenous Stopped 03/22/21 1429)  promethazine (PHENERGAN) 25 MG/ML injection (  Not Given 03/22/21 1332)  morphine 4 MG/ML injection 4 mg (4 mg Intravenous Given 03/22/21 1325)  HYDROmorphone (DILAUDID) injection 1 mg (1 mg Intravenous Given 03/22/21 1414)  HYDROmorphone (DILAUDID) injection 1 mg (1 mg Intravenous Given 03/22/21 1546)    ED Course  I have reviewed the triage vital signs and the nursing notes.  Pertinent labs & imaging results that were available during my care of the patient were reviewed by me and considered in my medical decision making (see chart for details).    MDM Rules/Calculators/A&P                         35 year old female with reported hematemesis, left lower quadrant pain.  Seen in the ER with the same for 2 days prior.  Patient here with very frequent ER visits with similar complaint.  Has a pending colonoscopy tomorrow.  On arrival, vitals reassuring.  Afebrile, tachycardic, tachypneic or hypoxic.  On physical exam, she does have some left lower quadrant tenderness as well as suprapubic tenderness, which is consistent with her presentation from 2 days prior.  She has had an extensive amount of CT scans in the last several months.  She has also had an ultrasound which was overall unremarkable.   Labs and imaging ordered, reviewed and interpreted by me.  UA with moderate leukocytes, few bacteria, however patient does not endorse any dysuria, flank  tenderness.  Not overly consistent with UTI.  CMP with a mildly elevated chloride, creatinine of 0.41 likely consistent with vomiting.  Her initial calcium was 5.9, however 2 days ago it was at 9.  I suspect a lab error as a repeat calcium was 8.9.  I did perform a rectal exam, fecal occult negative.  Lipase is normal.  CBC unremarkable.  Discussed the case with Dr. Fredderick Phenix.  Patient here with frequent visits with the same complaint, requesting pain medication.  She has a appointment with her GI doctor tomorrow she states that she has a colonoscopy.  Low suspicion for acute intra-abdominal process at this time.  Her hemoglobin is stable and she has no blood on rectal exam.  Plan for pain control and monitoring.  Patient was given pain medicine and Reglan as well as fluids.  Patient reportedly had one episode of emesis in the restroom, however nursing staff did not hear her vomit.  She denied any blood in her vomit.  She did state that her pain returned after the episode of vomiting.  She was given an additional dose of Dilaudid and notes improvement in her pain.  States she is stable to go home.  Patient states that she has Zofran at home, but this does not help.  Will send home with Reglan instead.  Encouraged her to continue to take the PPI.  Stressed follow-up with GI.  Return precautions discussed.  She was understanding and is agreeable.  Stable for discharge.   Final Clinical Impression(s) / ED Diagnoses Final diagnoses:  Left lower quadrant abdominal pain  Hematemesis with nausea    Rx / DC Orders ED Discharge Orders         Ordered    metoCLOPramide (REGLAN) 10 MG tablet  Every 6 hours        03/22/21 1610           Leone Brand 03/22/21 1611    Rolan Bucco, MD 04/09/21  0658  

## 2021-03-22 NOTE — Discharge Instructions (Signed)
Please take the nausea medicine as needed.  Continue to take the omeprazole daily.  Please make sure to follow-up with your eye doctor tomorrow.  Return to the ER for any new or worsening symptoms peer

## 2021-03-29 ENCOUNTER — Other Ambulatory Visit (HOSPITAL_BASED_OUTPATIENT_CLINIC_OR_DEPARTMENT_OTHER): Payer: Self-pay

## 2023-03-22 IMAGING — CT CT ABD-PELV W/ CM
3 of 5 series · 16 of 46 positions shown, 18 images · IV contrast (Omnipaque or Isovue)
Comparison: 02/24/2021

CLINICAL DATA: Abdominal pain for 3 months, worsening today, left
upper quadrant pain

EXAM:
CT ABDOMEN AND PELVIS WITH CONTRAST
TECHNIQUE: Multidetector CT imaging of the abdomen and pelvis was performed
using the standard protocol following bolus administration of
intravenous contrast.
CONTRAST:  100mL OMNIPAQUE IOHEXOL 300 MG/ML  SOLN

[Series 4: lung bases · axial · 0.67mm/px · 1 of 33 slices shown]
[im 9/33  bone]
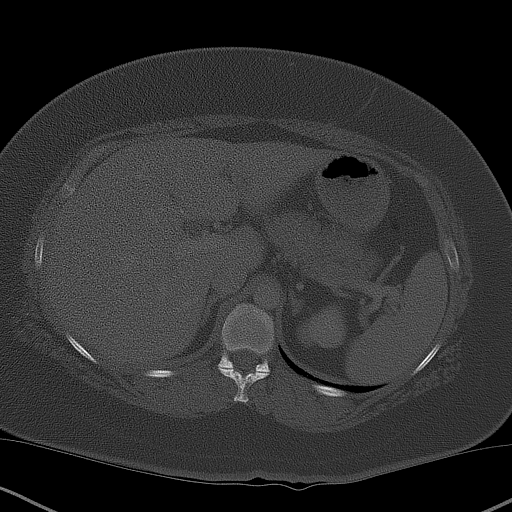

[Series 5: axial st · axial · 0.67mm/px · z∈[+723,+1133]mm · 12 of 98 slices shown, 14 images]
[im 8/98  soft-tissue]
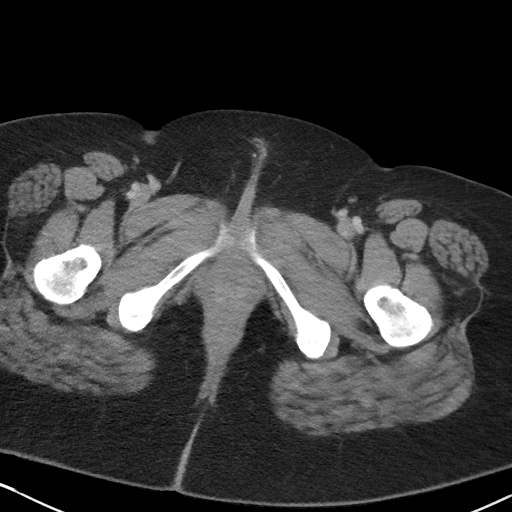
[im 8/98  bone]
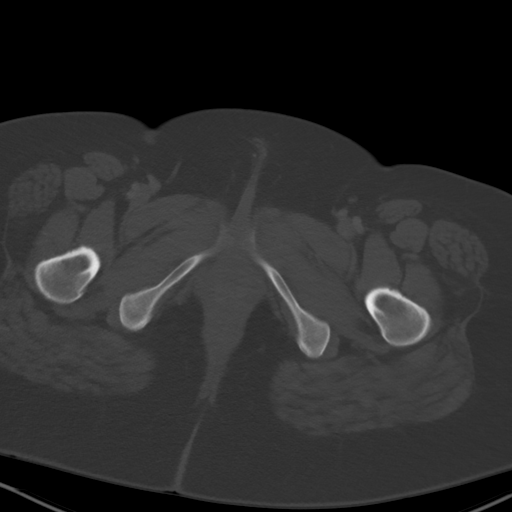
[im 15/98  soft-tissue]
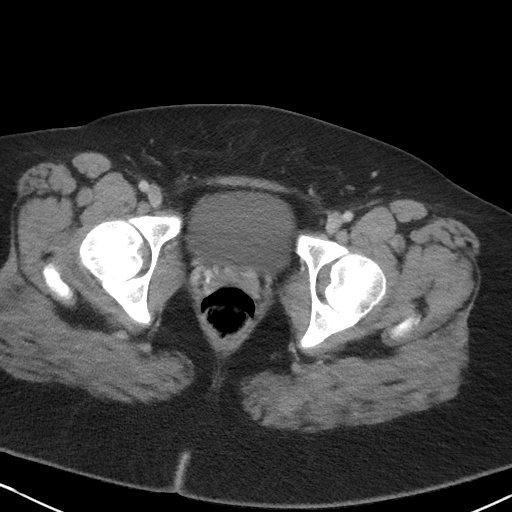
[im 23/98  soft-tissue]
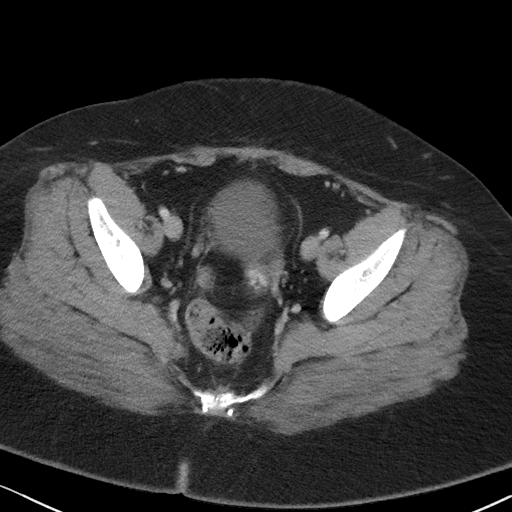
[im 30/98  soft-tissue]
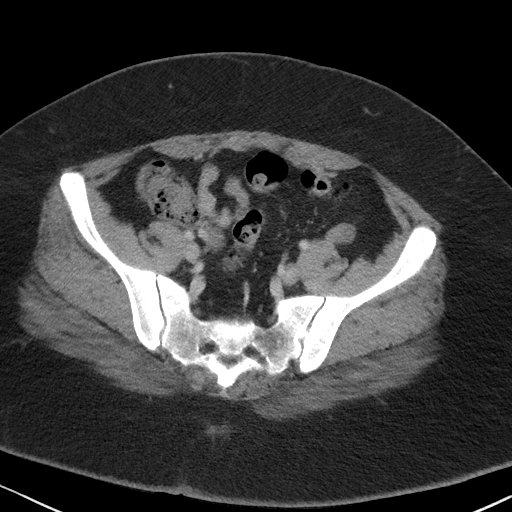
[im 38/98  soft-tissue]
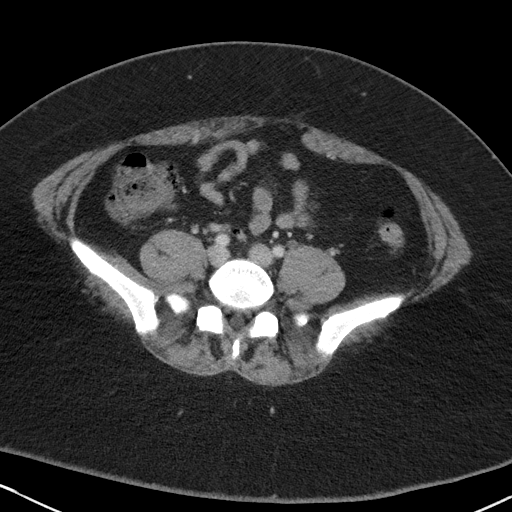
[im 45/98  soft-tissue]
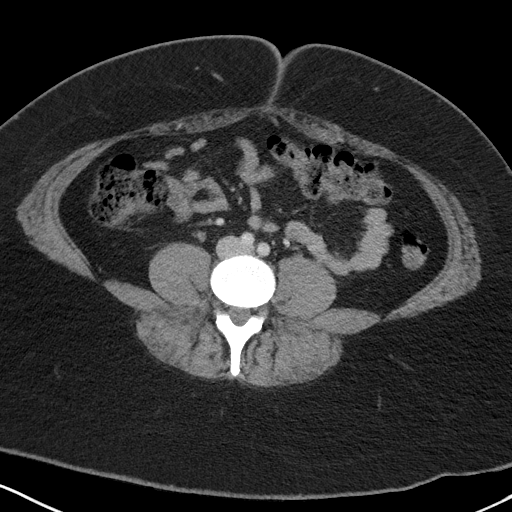
[im 53/98  soft-tissue]
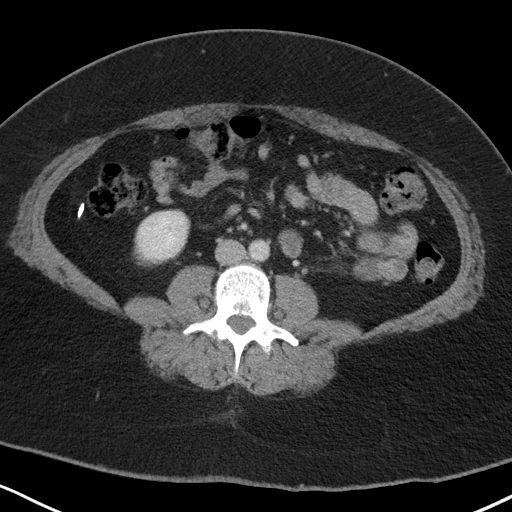
[im 60/98  soft-tissue]
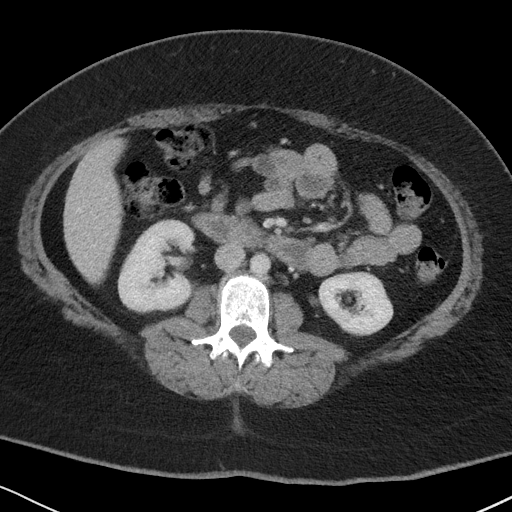
[im 68/98  soft-tissue]
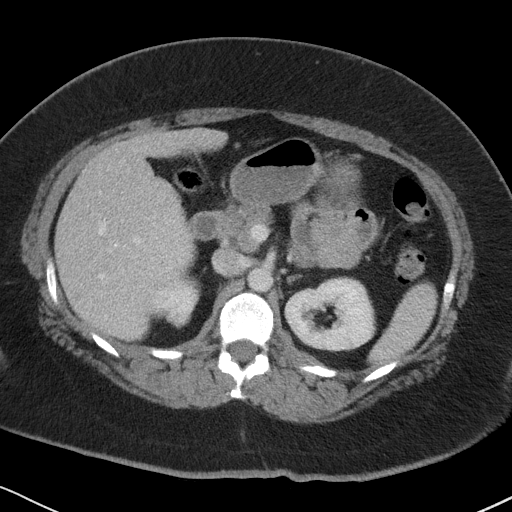
[im 68/98  bone]
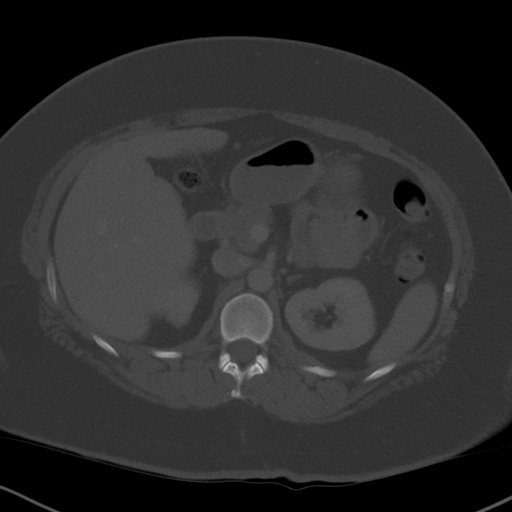
[im 75/98  soft-tissue]
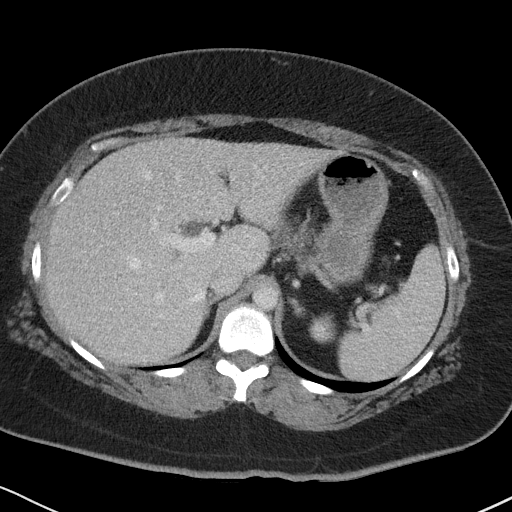
[im 83/98  soft-tissue]
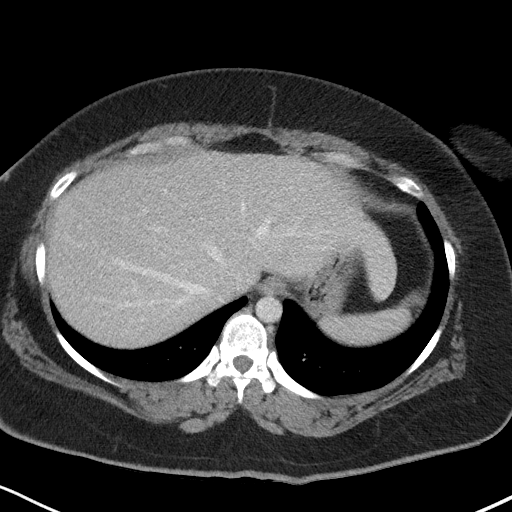
[im 90/98  soft-tissue]
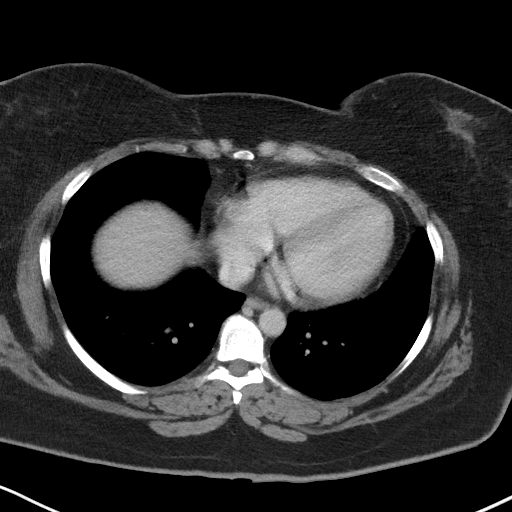

[Series 6: coronal st · coronal · 0.85mm/px · 3 of 86 slices shown]
[im 29/86  soft-tissue]
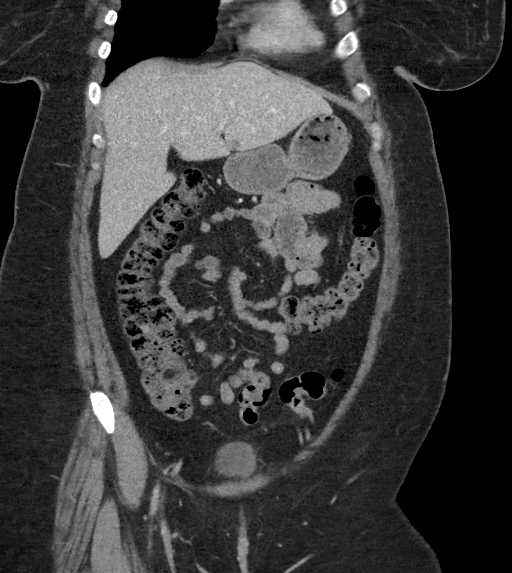
[im 38/86  soft-tissue]
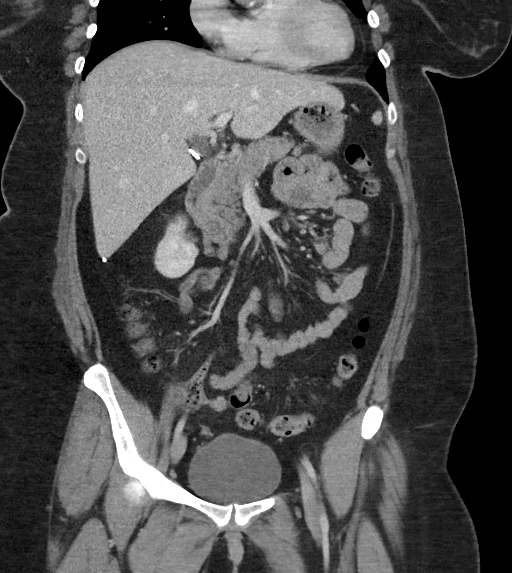
[im 48/86  soft-tissue]
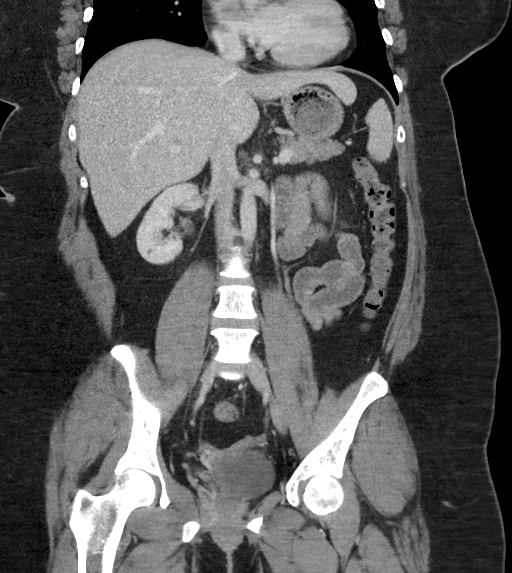

[16 of 46 positions shown; findings below may reference images not displayed]

FINDINGS: Lower chest: No acute pleural or parenchymal lung disease.

Hepatobiliary: No focal liver abnormality is seen. Status post
cholecystectomy. No biliary dilatation.

Pancreas: Unremarkable. No pancreatic ductal dilatation or
surrounding inflammatory changes.

Spleen: Normal in size without focal abnormality.

Adrenals/Urinary Tract: Adrenal glands are unremarkable. Kidneys are
normal, without renal calculi, focal lesion, or hydronephrosis.
Bladder is unremarkable.

Stomach/Bowel: No bowel obstruction or ileus. Moderate stool
throughout the colon consistent with constipation. Normal appendix
right lower quadrant. No bowel wall thickening or inflammatory
change.

Vascular/Lymphatic: No significant vascular findings are present. No
enlarged abdominal or pelvic lymph nodes.

Reproductive: Status post hysterectomy. No adnexal masses.

Other: No free fluid or free gas. Small fat containing umbilical
hernia unchanged.

Musculoskeletal: No acute or destructive bony lesions. Reconstructed
images demonstrate no additional findings.
IMPRESSION: 1. No acute intra-abdominal or intrapelvic process.
2. Moderate fecal retention consistent with constipation.
